# Patient Record
Sex: Female | Born: 1935 | Race: White | Hispanic: No | Marital: Married | Smoking: Former smoker
Health system: Southern US, Community
[De-identification: ages and names within clinical notes are randomized; demographics above are authoritative.]

## PROBLEM LIST (undated history)

## (undated) DIAGNOSIS — I4891 Unspecified atrial fibrillation: Secondary | ICD-10-CM

## (undated) DIAGNOSIS — R5381 Other malaise: Secondary | ICD-10-CM

## (undated) DIAGNOSIS — N19 Unspecified kidney failure: Secondary | ICD-10-CM

## (undated) DIAGNOSIS — E875 Hyperkalemia: Secondary | ICD-10-CM

## (undated) DIAGNOSIS — K56609 Unspecified intestinal obstruction, unspecified as to partial versus complete obstruction: Secondary | ICD-10-CM

## (undated) DIAGNOSIS — M1710 Unilateral primary osteoarthritis, unspecified knee: Secondary | ICD-10-CM

## (undated) DIAGNOSIS — R809 Proteinuria, unspecified: Secondary | ICD-10-CM

## (undated) DIAGNOSIS — C4490 Unspecified malignant neoplasm of skin, unspecified: Secondary | ICD-10-CM

## (undated) DIAGNOSIS — H2510 Age-related nuclear cataract, unspecified eye: Secondary | ICD-10-CM

## (undated) DIAGNOSIS — L600 Ingrowing nail: Secondary | ICD-10-CM

## (undated) DIAGNOSIS — E119 Type 2 diabetes mellitus without complications: Secondary | ICD-10-CM

## (undated) DIAGNOSIS — I499 Cardiac arrhythmia, unspecified: Secondary | ICD-10-CM

## (undated) DIAGNOSIS — B354 Tinea corporis: Secondary | ICD-10-CM

## (undated) DIAGNOSIS — I10 Essential (primary) hypertension: Secondary | ICD-10-CM

## (undated) DIAGNOSIS — N189 Chronic kidney disease, unspecified: Secondary | ICD-10-CM

## (undated) DIAGNOSIS — R5383 Other fatigue: Secondary | ICD-10-CM

## (undated) DIAGNOSIS — G5600 Carpal tunnel syndrome, unspecified upper limb: Secondary | ICD-10-CM

## (undated) DIAGNOSIS — G5790 Unspecified mononeuropathy of unspecified lower limb: Secondary | ICD-10-CM

## (undated) DIAGNOSIS — M62838 Other muscle spasm: Secondary | ICD-10-CM

## (undated) DIAGNOSIS — H353190 Nonexudative age-related macular degeneration, unspecified eye, stage unspecified: Secondary | ICD-10-CM

## (undated) DIAGNOSIS — E7800 Pure hypercholesterolemia, unspecified: Secondary | ICD-10-CM

## (undated) HISTORY — DX: Unspecified intestinal obstruction, unspecified as to partial versus complete obstruction: K56.609

## (undated) HISTORY — PX: CARPAL TUNNEL RELEASE: SHX101

## (undated) HISTORY — DX: Ingrowing nail: L60.0

## (undated) HISTORY — PX: SPINAL FUSION: SHX223

## (undated) HISTORY — DX: Unspecified malignant neoplasm of skin, unspecified: C44.90

## (undated) HISTORY — DX: Unspecified mononeuropathy of unspecified lower limb: G57.90

## (undated) HISTORY — DX: Unspecified kidney failure: N19

## (undated) HISTORY — DX: Hyperkalemia: E87.5

## (undated) HISTORY — PX: OOPHORECTOMY: SHX86

## (undated) HISTORY — DX: Other malaise: R53.81

## (undated) HISTORY — PX: TONSILLECTOMY: SUR1361

## (undated) HISTORY — PX: ABDOMINAL HYSTERECTOMY: SHX81

## (undated) HISTORY — DX: Proteinuria, unspecified: R80.9

## (undated) HISTORY — DX: Carpal tunnel syndrome, unspecified upper limb: G56.00

## (undated) HISTORY — DX: Unspecified atrial fibrillation: I48.91

## (undated) HISTORY — DX: Other muscle spasm: M62.838

## (undated) HISTORY — DX: Chronic kidney disease, unspecified: N18.9

## (undated) HISTORY — DX: Essential (primary) hypertension: I10

## (undated) HISTORY — DX: Other fatigue: R53.83

## (undated) HISTORY — DX: Type 2 diabetes mellitus without complications: E11.9

## (undated) HISTORY — DX: Pure hypercholesterolemia, unspecified: E78.00

## (undated) HISTORY — DX: Nonexudative age-related macular degeneration, unspecified eye, stage unspecified: H35.3190

## (undated) HISTORY — PX: APPENDECTOMY: SHX54

## (undated) HISTORY — DX: Unilateral primary osteoarthritis, unspecified knee: M17.10

## (undated) HISTORY — DX: Tinea corporis: B35.4

## (undated) HISTORY — DX: Age-related nuclear cataract, unspecified eye: H25.10

---

## 1998-11-24 ENCOUNTER — Emergency Department (HOSPITAL_COMMUNITY): Admission: EM | Admit: 1998-11-24 | Discharge: 1998-11-24 | Payer: Self-pay | Admitting: Emergency Medicine

## 2006-04-29 ENCOUNTER — Ambulatory Visit: Payer: Self-pay | Admitting: Family Medicine

## 2006-05-11 ENCOUNTER — Ambulatory Visit: Payer: Self-pay | Admitting: Family Medicine

## 2006-05-20 ENCOUNTER — Ambulatory Visit: Payer: Self-pay | Admitting: Family Medicine

## 2006-05-20 LAB — CONVERTED CEMR LAB
CO2: 28 meq/L (ref 19–32)
Calcium: 9.6 mg/dL (ref 8.4–10.5)
Chloride: 103 meq/L (ref 96–112)
Glucose, Bld: 156 mg/dL — ABNORMAL HIGH (ref 70–99)
Potassium: 3.9 meq/L (ref 3.5–5.3)
Sodium: 142 meq/L (ref 135–145)

## 2006-06-15 ENCOUNTER — Ambulatory Visit: Payer: Self-pay | Admitting: Cardiology

## 2006-06-17 ENCOUNTER — Ambulatory Visit: Payer: Self-pay | Admitting: Family Medicine

## 2006-06-18 ENCOUNTER — Encounter: Payer: Self-pay | Admitting: Family Medicine

## 2006-06-18 LAB — CONVERTED CEMR LAB
ALT: 15 units/L (ref 0–35)
Albumin: 4.1 g/dL (ref 3.5–5.2)
Alkaline Phosphatase: 50 units/L (ref 39–117)
Bilirubin, Direct: 0.1 mg/dL (ref 0.0–0.3)
Total Protein: 6.8 g/dL (ref 6.0–8.3)

## 2006-06-24 DIAGNOSIS — M1710 Unilateral primary osteoarthritis, unspecified knee: Secondary | ICD-10-CM

## 2006-06-24 DIAGNOSIS — E7800 Pure hypercholesterolemia, unspecified: Secondary | ICD-10-CM

## 2006-06-24 DIAGNOSIS — M545 Low back pain, unspecified: Secondary | ICD-10-CM | POA: Insufficient documentation

## 2006-06-24 DIAGNOSIS — E119 Type 2 diabetes mellitus without complications: Secondary | ICD-10-CM | POA: Insufficient documentation

## 2006-06-24 DIAGNOSIS — IMO0002 Reserved for concepts with insufficient information to code with codable children: Secondary | ICD-10-CM | POA: Insufficient documentation

## 2006-06-24 DIAGNOSIS — I10 Essential (primary) hypertension: Secondary | ICD-10-CM

## 2006-06-24 DIAGNOSIS — G8929 Other chronic pain: Secondary | ICD-10-CM

## 2006-06-24 HISTORY — DX: Type 2 diabetes mellitus without complications: E11.9

## 2006-06-24 HISTORY — DX: Reserved for concepts with insufficient information to code with codable children: IMO0002

## 2006-06-24 HISTORY — DX: Essential (primary) hypertension: I10

## 2006-06-24 HISTORY — DX: Pure hypercholesterolemia, unspecified: E78.00

## 2006-07-12 ENCOUNTER — Ambulatory Visit: Payer: Self-pay | Admitting: Cardiology

## 2006-08-12 ENCOUNTER — Ambulatory Visit: Payer: Self-pay | Admitting: Cardiology

## 2006-10-11 ENCOUNTER — Encounter: Payer: Self-pay | Admitting: Internal Medicine

## 2006-11-08 ENCOUNTER — Ambulatory Visit: Payer: Self-pay | Admitting: Internal Medicine

## 2006-11-08 DIAGNOSIS — C4490 Unspecified malignant neoplasm of skin, unspecified: Secondary | ICD-10-CM

## 2006-11-08 DIAGNOSIS — G5790 Unspecified mononeuropathy of unspecified lower limb: Secondary | ICD-10-CM

## 2006-11-08 DIAGNOSIS — G5600 Carpal tunnel syndrome, unspecified upper limb: Secondary | ICD-10-CM

## 2006-11-08 HISTORY — DX: Unspecified mononeuropathy of unspecified lower limb: G57.90

## 2006-11-08 HISTORY — DX: Unspecified malignant neoplasm of skin, unspecified: C44.90

## 2006-11-08 HISTORY — DX: Carpal tunnel syndrome, unspecified upper limb: G56.00

## 2006-11-13 LAB — CONVERTED CEMR LAB
BUN: 20 mg/dL (ref 6–23)
CO2: 30 meq/L (ref 19–32)
Calcium: 9.6 mg/dL (ref 8.4–10.5)
Creatinine, Ser: 0.8 mg/dL (ref 0.4–1.2)
Creatinine,U: 39.5 mg/dL
Hgb A1c MFr Bld: 7.1 % — ABNORMAL HIGH (ref 4.6–6.0)
Microalb Creat Ratio: 3969.6 mg/g — ABNORMAL HIGH (ref 0.0–30.0)
Microalb, Ur: 156.8 mg/dL — ABNORMAL HIGH (ref 0.0–1.9)
Potassium: 3.7 meq/L (ref 3.5–5.1)

## 2006-12-09 ENCOUNTER — Ambulatory Visit: Payer: Self-pay | Admitting: Internal Medicine

## 2006-12-09 ENCOUNTER — Encounter: Payer: Self-pay | Admitting: Internal Medicine

## 2006-12-09 ENCOUNTER — Ambulatory Visit: Payer: Self-pay | Admitting: Cardiology

## 2006-12-09 ENCOUNTER — Inpatient Hospital Stay (HOSPITAL_COMMUNITY): Admission: EM | Admit: 2006-12-09 | Discharge: 2006-12-14 | Payer: Self-pay | Admitting: Emergency Medicine

## 2006-12-09 ENCOUNTER — Telehealth (INDEPENDENT_AMBULATORY_CARE_PROVIDER_SITE_OTHER): Payer: Self-pay | Admitting: *Deleted

## 2006-12-15 ENCOUNTER — Ambulatory Visit: Payer: Self-pay | Admitting: Cardiology

## 2006-12-15 ENCOUNTER — Telehealth: Payer: Self-pay | Admitting: Internal Medicine

## 2006-12-15 LAB — CONVERTED CEMR LAB
BUN: 17 mg/dL (ref 6–23)
Calcium: 10.2 mg/dL (ref 8.4–10.5)
Creatinine, Ser: 1 mg/dL (ref 0.4–1.2)
GFR calc non Af Amer: 58 mL/min
Potassium: 3.9 meq/L (ref 3.5–5.1)
Sodium: 141 meq/L (ref 135–145)

## 2006-12-21 ENCOUNTER — Ambulatory Visit: Payer: Self-pay | Admitting: Internal Medicine

## 2006-12-24 ENCOUNTER — Telehealth: Payer: Self-pay | Admitting: Internal Medicine

## 2006-12-30 ENCOUNTER — Ambulatory Visit: Payer: Self-pay | Admitting: Cardiology

## 2007-01-05 ENCOUNTER — Encounter: Payer: Self-pay | Admitting: Internal Medicine

## 2007-01-05 ENCOUNTER — Ambulatory Visit: Payer: Self-pay | Admitting: Vascular Surgery

## 2007-01-17 ENCOUNTER — Ambulatory Visit: Payer: Self-pay | Admitting: Cardiology

## 2007-01-18 ENCOUNTER — Ambulatory Visit: Payer: Self-pay | Admitting: Internal Medicine

## 2007-02-09 ENCOUNTER — Ambulatory Visit: Payer: Self-pay | Admitting: Internal Medicine

## 2007-03-01 ENCOUNTER — Ambulatory Visit: Payer: Self-pay | Admitting: Internal Medicine

## 2007-03-01 DIAGNOSIS — K5900 Constipation, unspecified: Secondary | ICD-10-CM | POA: Insufficient documentation

## 2007-04-25 ENCOUNTER — Ambulatory Visit: Payer: Self-pay | Admitting: Cardiology

## 2007-06-01 ENCOUNTER — Ambulatory Visit: Payer: Self-pay | Admitting: Internal Medicine

## 2007-06-02 ENCOUNTER — Telehealth: Payer: Self-pay | Admitting: Internal Medicine

## 2007-06-02 LAB — CONVERTED CEMR LAB
ALT: 24 units/L (ref 0–35)
AST: 19 units/L (ref 0–37)
Cholesterol: 155 mg/dL (ref 0–200)
Creatinine, Ser: 1.5 mg/dL — ABNORMAL HIGH (ref 0.4–1.2)
Direct LDL: 68.7 mg/dL
Eosinophils Absolute: 0.2 10*3/uL (ref 0.0–0.6)
GFR calc Af Amer: 44 mL/min
Glucose, Bld: 145 mg/dL — ABNORMAL HIGH (ref 70–99)
HDL: 51.7 mg/dL (ref >39.0)
Hemoglobin: 12.5 g/dL (ref 12.0–15.0)
Lymphocytes Relative: 26.7 % (ref 12.0–46.0)
MCHC: 33.8 g/dL (ref 30.0–36.0)
Platelets: 296 10*3/uL (ref 150–400)
TSH: 2.74 microintl units/mL (ref 0.35–5.50)
Total CHOL/HDL Ratio: 3
Triglycerides: 253 mg/dL (ref 0–149)
WBC: 8.9 10*3/uL (ref 4.5–10.5)

## 2007-07-20 ENCOUNTER — Ambulatory Visit: Payer: Self-pay | Admitting: Cardiology

## 2007-09-28 ENCOUNTER — Ambulatory Visit: Payer: Self-pay | Admitting: Internal Medicine

## 2007-09-29 ENCOUNTER — Telehealth: Payer: Self-pay | Admitting: Internal Medicine

## 2007-09-29 LAB — CONVERTED CEMR LAB
BUN: 58 mg/dL — ABNORMAL HIGH (ref 6–23)
CO2: 23 meq/L (ref 19–32)
GFR calc Af Amer: 38 mL/min
GFR calc non Af Amer: 31 mL/min
Hgb A1c MFr Bld: 6 % (ref 4.6–6.0)
Potassium: 4.5 meq/L (ref 3.5–5.1)
Sodium: 135 meq/L (ref 135–145)

## 2007-10-05 ENCOUNTER — Telehealth (INDEPENDENT_AMBULATORY_CARE_PROVIDER_SITE_OTHER): Payer: Self-pay | Admitting: *Deleted

## 2007-11-01 ENCOUNTER — Ambulatory Visit: Payer: Self-pay | Admitting: Cardiology

## 2007-11-22 ENCOUNTER — Ambulatory Visit: Payer: Self-pay | Admitting: *Deleted

## 2007-11-22 DIAGNOSIS — R5383 Other fatigue: Secondary | ICD-10-CM

## 2007-11-22 DIAGNOSIS — N19 Unspecified kidney failure: Secondary | ICD-10-CM

## 2007-11-22 DIAGNOSIS — R5381 Other malaise: Secondary | ICD-10-CM

## 2007-11-22 HISTORY — DX: Unspecified kidney failure: N19

## 2007-11-22 HISTORY — DX: Other malaise: R53.81

## 2007-11-23 LAB — CONVERTED CEMR LAB
BUN: 84 mg/dL — ABNORMAL HIGH (ref 6–23)
Basophils Absolute: 0 10*3/uL (ref 0.0–0.1)
Basophils Relative: 0.2 % (ref 0.0–3.0)
CO2: 22 meq/L (ref 19–32)
Creatinine, Ser: 2 mg/dL — ABNORMAL HIGH (ref 0.4–1.2)
GFR calc Af Amer: 31 mL/min
GFR calc non Af Amer: 26 mL/min
Lymphocytes Relative: 7.4 % — ABNORMAL LOW (ref 12.0–46.0)
MCHC: 34.4 g/dL (ref 30.0–36.0)
Monocytes Absolute: 0.2 10*3/uL (ref 0.1–1.0)
Monocytes Relative: 1.8 % — ABNORMAL LOW (ref 3.0–12.0)
Neutrophils Relative %: 88.9 % — ABNORMAL HIGH (ref 43.0–77.0)
Platelets: 268 10*3/uL (ref 150–400)
Potassium: 5.8 meq/L — ABNORMAL HIGH (ref 3.5–5.1)
RBC: 3.49 M/uL — ABNORMAL LOW (ref 3.87–5.11)
RDW: 12.1 % (ref 11.5–14.6)
Sodium: 135 meq/L (ref 135–145)
TSH: 2.55 microintl units/mL (ref 0.35–5.50)
WBC: 9.2 10*3/uL (ref 4.5–10.5)

## 2007-11-25 ENCOUNTER — Telehealth (INDEPENDENT_AMBULATORY_CARE_PROVIDER_SITE_OTHER): Payer: Self-pay | Admitting: *Deleted

## 2007-11-30 ENCOUNTER — Ambulatory Visit: Payer: Self-pay | Admitting: *Deleted

## 2007-11-30 DIAGNOSIS — B354 Tinea corporis: Secondary | ICD-10-CM

## 2007-11-30 DIAGNOSIS — E875 Hyperkalemia: Secondary | ICD-10-CM

## 2007-11-30 HISTORY — DX: Tinea corporis: B35.4

## 2007-12-01 LAB — CONVERTED CEMR LAB
Calcium: 10.3 mg/dL (ref 8.4–10.5)
Chloride: 103 meq/L (ref 96–112)
GFR calc non Af Amer: 33 mL/min
Potassium: 5.1 meq/L (ref 3.5–5.1)

## 2007-12-16 ENCOUNTER — Encounter (INDEPENDENT_AMBULATORY_CARE_PROVIDER_SITE_OTHER): Payer: Self-pay | Admitting: *Deleted

## 2007-12-19 ENCOUNTER — Telehealth (INDEPENDENT_AMBULATORY_CARE_PROVIDER_SITE_OTHER): Payer: Self-pay | Admitting: *Deleted

## 2008-01-16 ENCOUNTER — Ambulatory Visit: Payer: Self-pay | Admitting: *Deleted

## 2008-02-01 ENCOUNTER — Ambulatory Visit: Payer: Self-pay | Admitting: Cardiology

## 2008-02-13 ENCOUNTER — Ambulatory Visit: Payer: Self-pay | Admitting: *Deleted

## 2008-02-14 LAB — CONVERTED CEMR LAB
AST: 21 units/L (ref 0–37)
BUN: 32 mg/dL — ABNORMAL HIGH (ref 6–23)
Chloride: 99 meq/L (ref 96–112)
Creatinine, Ser: 1.3 mg/dL — ABNORMAL HIGH (ref 0.4–1.2)
Creatinine,U: 17.9 mg/dL
Microalb Creat Ratio: 234.6 mg/g — ABNORMAL HIGH (ref 0.0–30.0)
Sodium: 139 meq/L (ref 135–145)
Total Bilirubin: 0.7 mg/dL (ref 0.3–1.2)
Total Protein: 7.9 g/dL (ref 6.0–8.3)

## 2008-04-10 ENCOUNTER — Ambulatory Visit: Payer: Self-pay | Admitting: *Deleted

## 2008-04-10 DIAGNOSIS — N184 Chronic kidney disease, stage 4 (severe): Secondary | ICD-10-CM

## 2008-04-10 DIAGNOSIS — L600 Ingrowing nail: Secondary | ICD-10-CM

## 2008-04-10 DIAGNOSIS — N189 Chronic kidney disease, unspecified: Secondary | ICD-10-CM

## 2008-04-10 HISTORY — DX: Ingrowing nail: L60.0

## 2008-04-10 HISTORY — DX: Chronic kidney disease, unspecified: N18.9

## 2008-05-16 ENCOUNTER — Ambulatory Visit: Payer: Self-pay | Admitting: *Deleted

## 2008-05-17 DIAGNOSIS — R809 Proteinuria, unspecified: Secondary | ICD-10-CM | POA: Insufficient documentation

## 2008-05-17 HISTORY — DX: Proteinuria, unspecified: R80.9

## 2008-05-17 LAB — CONVERTED CEMR LAB
ALT: 18 units/L (ref 0–35)
AST: 17 units/L (ref 0–37)
BUN: 29 mg/dL — ABNORMAL HIGH (ref 6–23)
CO2: 29 meq/L (ref 19–32)
Calcium: 10.7 mg/dL — ABNORMAL HIGH (ref 8.4–10.5)
Cholesterol: 176 mg/dL (ref 0–200)
Creatinine, Ser: 1.2 mg/dL (ref 0.4–1.2)
Creatinine,U: 56.5 mg/dL
GFR calc Af Amer: 56 mL/min
Glucose, Bld: 111 mg/dL — ABNORMAL HIGH (ref 70–99)
HDL: 55.8 mg/dL (ref >39.0)
Microalb Creat Ratio: 318.6 mg/g — ABNORMAL HIGH (ref 0.0–30.0)
Microalb, Ur: 18 mg/dL — ABNORMAL HIGH (ref 0.0–1.9)
Potassium: 4 meq/L (ref 3.5–5.1)
Total Protein: 7.9 g/dL (ref 6.0–8.3)
VLDL: 38 mg/dL (ref 0–40)

## 2008-06-07 ENCOUNTER — Telehealth (INDEPENDENT_AMBULATORY_CARE_PROVIDER_SITE_OTHER): Payer: Self-pay | Admitting: *Deleted

## 2008-06-14 ENCOUNTER — Telehealth (INDEPENDENT_AMBULATORY_CARE_PROVIDER_SITE_OTHER): Payer: Self-pay | Admitting: *Deleted

## 2008-07-31 ENCOUNTER — Telehealth (INDEPENDENT_AMBULATORY_CARE_PROVIDER_SITE_OTHER): Payer: Self-pay | Admitting: *Deleted

## 2008-08-08 ENCOUNTER — Ambulatory Visit: Payer: Self-pay | Admitting: Family Medicine

## 2008-08-08 ENCOUNTER — Encounter: Payer: Self-pay | Admitting: Internal Medicine

## 2008-09-06 ENCOUNTER — Ambulatory Visit: Payer: Self-pay | Admitting: Family Medicine

## 2008-09-06 DIAGNOSIS — J309 Allergic rhinitis, unspecified: Secondary | ICD-10-CM | POA: Insufficient documentation

## 2008-09-10 LAB — CONVERTED CEMR LAB: Hgb A1c MFr Bld: 6.8 % — ABNORMAL HIGH (ref 4.6–6.5)

## 2008-09-29 DIAGNOSIS — I4891 Unspecified atrial fibrillation: Secondary | ICD-10-CM

## 2008-09-29 HISTORY — DX: Unspecified atrial fibrillation: I48.91

## 2008-10-05 ENCOUNTER — Ambulatory Visit: Payer: Self-pay | Admitting: Cardiology

## 2008-10-12 ENCOUNTER — Telehealth (INDEPENDENT_AMBULATORY_CARE_PROVIDER_SITE_OTHER): Payer: Self-pay | Admitting: *Deleted

## 2008-10-15 ENCOUNTER — Ambulatory Visit: Payer: Self-pay | Admitting: Diagnostic Radiology

## 2008-10-15 ENCOUNTER — Ambulatory Visit: Payer: Self-pay | Admitting: Internal Medicine

## 2008-10-15 ENCOUNTER — Encounter: Payer: Self-pay | Admitting: Emergency Medicine

## 2008-10-15 ENCOUNTER — Inpatient Hospital Stay (HOSPITAL_COMMUNITY): Admission: EM | Admit: 2008-10-15 | Discharge: 2008-10-18 | Payer: Self-pay | Admitting: Podiatry

## 2008-11-05 ENCOUNTER — Ambulatory Visit: Payer: Self-pay | Admitting: Family Medicine

## 2008-11-05 DIAGNOSIS — K56609 Unspecified intestinal obstruction, unspecified as to partial versus complete obstruction: Secondary | ICD-10-CM

## 2008-11-05 HISTORY — DX: Unspecified intestinal obstruction, unspecified as to partial versus complete obstruction: K56.609

## 2008-11-08 ENCOUNTER — Telehealth (INDEPENDENT_AMBULATORY_CARE_PROVIDER_SITE_OTHER): Payer: Self-pay | Admitting: *Deleted

## 2008-12-10 ENCOUNTER — Telehealth (INDEPENDENT_AMBULATORY_CARE_PROVIDER_SITE_OTHER): Payer: Self-pay | Admitting: *Deleted

## 2008-12-12 ENCOUNTER — Ambulatory Visit: Payer: Self-pay | Admitting: Family Medicine

## 2008-12-12 LAB — CONVERTED CEMR LAB
AST: 22 units/L (ref 0–37)
Albumin: 4.4 g/dL (ref 3.5–5.2)
Alkaline Phosphatase: 52 units/L (ref 39–117)
Chloride: 102 meq/L (ref 96–112)
Cholesterol: 190 mg/dL (ref 0–200)
Creatinine, Ser: 1.2 mg/dL (ref 0.4–1.2)
Glucose, Bld: 121 mg/dL — ABNORMAL HIGH (ref 70–99)
Potassium: 3.5 meq/L (ref 3.5–5.1)
Sodium: 140 meq/L (ref 135–145)
Total Bilirubin: 0.8 mg/dL (ref 0.3–1.2)
Total CHOL/HDL Ratio: 4

## 2008-12-13 ENCOUNTER — Encounter (INDEPENDENT_AMBULATORY_CARE_PROVIDER_SITE_OTHER): Payer: Self-pay | Admitting: *Deleted

## 2008-12-13 ENCOUNTER — Telehealth (INDEPENDENT_AMBULATORY_CARE_PROVIDER_SITE_OTHER): Payer: Self-pay | Admitting: *Deleted

## 2008-12-27 ENCOUNTER — Telehealth (INDEPENDENT_AMBULATORY_CARE_PROVIDER_SITE_OTHER): Payer: Self-pay | Admitting: *Deleted

## 2009-01-16 ENCOUNTER — Encounter (INDEPENDENT_AMBULATORY_CARE_PROVIDER_SITE_OTHER): Payer: Self-pay | Admitting: *Deleted

## 2009-01-31 ENCOUNTER — Ambulatory Visit: Payer: Self-pay | Admitting: Family Medicine

## 2009-02-01 LAB — CONVERTED CEMR LAB
AST: 21 units/L (ref 0–37)
Alkaline Phosphatase: 33 units/L — ABNORMAL LOW (ref 39–117)
Total Protein: 8.2 g/dL (ref 6.0–8.3)

## 2009-02-21 ENCOUNTER — Telehealth (INDEPENDENT_AMBULATORY_CARE_PROVIDER_SITE_OTHER): Payer: Self-pay | Admitting: *Deleted

## 2009-03-07 ENCOUNTER — Telehealth (INDEPENDENT_AMBULATORY_CARE_PROVIDER_SITE_OTHER): Payer: Self-pay | Admitting: *Deleted

## 2009-03-15 ENCOUNTER — Ambulatory Visit: Payer: Self-pay | Admitting: Family

## 2009-03-15 LAB — CONVERTED CEMR LAB
Calcium: 10.4 mg/dL (ref 8.4–10.5)
Cholesterol: 201 mg/dL — ABNORMAL HIGH (ref 0–200)
Creatinine, Ser: 1.6 mg/dL — ABNORMAL HIGH (ref 0.4–1.2)
Direct LDL: 111.4 mg/dL
Glucose, Bld: 113 mg/dL — ABNORMAL HIGH (ref 70–99)
Potassium: 4.3 meq/L (ref 3.5–5.1)
Total CHOL/HDL Ratio: 4
Triglycerides: 215 mg/dL — ABNORMAL HIGH (ref 0.0–149.0)
VLDL: 43 mg/dL — ABNORMAL HIGH (ref 0.0–40.0)

## 2009-03-18 ENCOUNTER — Telehealth: Payer: Self-pay | Admitting: Family

## 2009-03-25 ENCOUNTER — Telehealth (INDEPENDENT_AMBULATORY_CARE_PROVIDER_SITE_OTHER): Payer: Self-pay | Admitting: *Deleted

## 2009-04-15 ENCOUNTER — Ambulatory Visit: Payer: Self-pay | Admitting: Family

## 2009-04-15 LAB — CONVERTED CEMR LAB
Cholesterol, target level: 200 mg/dL
LDL Goal: 100 mg/dL

## 2009-04-18 ENCOUNTER — Telehealth (INDEPENDENT_AMBULATORY_CARE_PROVIDER_SITE_OTHER): Payer: Self-pay | Admitting: *Deleted

## 2009-06-17 ENCOUNTER — Telehealth: Payer: Self-pay | Admitting: Family

## 2009-06-17 ENCOUNTER — Ambulatory Visit: Payer: Self-pay | Admitting: Family

## 2009-06-17 LAB — CONVERTED CEMR LAB
ALT: 23 units/L (ref 0–35)
Albumin: 4.4 g/dL (ref 3.5–5.2)
BUN: 38 mg/dL — ABNORMAL HIGH (ref 6–23)
Calcium: 10.4 mg/dL (ref 8.4–10.5)
Chloride: 107 meq/L (ref 96–112)
Creatinine,U: 133.3 mg/dL
GFR calc non Af Amer: 30.98 mL/min (ref >60)
Hgb A1c MFr Bld: 7.2 % — ABNORMAL HIGH (ref 4.6–6.5)
Microalb, Ur: 28.8 mg/dL — ABNORMAL HIGH (ref 0.0–1.9)
Potassium: 4 meq/L (ref 3.5–5.1)
Sodium: 144 meq/L (ref 135–145)
Total Bilirubin: 0.3 mg/dL (ref 0.3–1.2)
Total CHOL/HDL Ratio: 3
Total Protein: 7.9 g/dL (ref 6.0–8.3)
VLDL: 46.8 mg/dL — ABNORMAL HIGH (ref 0.0–40.0)

## 2009-06-19 ENCOUNTER — Telehealth: Payer: Self-pay | Admitting: Family

## 2009-06-21 ENCOUNTER — Telehealth: Payer: Self-pay | Admitting: Family

## 2009-07-02 ENCOUNTER — Encounter: Payer: Self-pay | Admitting: Family Medicine

## 2009-07-02 LAB — HM DIABETES EYE EXAM: HM Diabetic Eye Exam: NORMAL

## 2009-07-03 ENCOUNTER — Encounter (INDEPENDENT_AMBULATORY_CARE_PROVIDER_SITE_OTHER): Payer: Self-pay | Admitting: *Deleted

## 2009-07-04 ENCOUNTER — Encounter: Payer: Self-pay | Admitting: Family Medicine

## 2009-07-05 ENCOUNTER — Telehealth (INDEPENDENT_AMBULATORY_CARE_PROVIDER_SITE_OTHER): Payer: Self-pay | Admitting: *Deleted

## 2009-07-10 ENCOUNTER — Telehealth (INDEPENDENT_AMBULATORY_CARE_PROVIDER_SITE_OTHER): Payer: Self-pay | Admitting: *Deleted

## 2009-08-08 ENCOUNTER — Ambulatory Visit: Payer: Self-pay | Admitting: Family Medicine

## 2009-08-08 DIAGNOSIS — R42 Dizziness and giddiness: Secondary | ICD-10-CM | POA: Insufficient documentation

## 2009-08-14 ENCOUNTER — Ambulatory Visit: Payer: Self-pay | Admitting: Cardiology

## 2009-08-15 ENCOUNTER — Ambulatory Visit: Payer: Self-pay | Admitting: Family Medicine

## 2009-09-11 ENCOUNTER — Ambulatory Visit: Payer: Self-pay | Admitting: Family Medicine

## 2009-09-11 DIAGNOSIS — R609 Edema, unspecified: Secondary | ICD-10-CM

## 2009-09-13 LAB — CONVERTED CEMR LAB
CO2: 24 meq/L (ref 19–32)
Calcium: 10.1 mg/dL (ref 8.4–10.5)
Chloride: 103 meq/L (ref 96–112)
GFR calc non Af Amer: 48.12 mL/min (ref >60)

## 2009-10-02 ENCOUNTER — Ambulatory Visit: Payer: Self-pay | Admitting: Family Medicine

## 2009-10-04 LAB — CONVERTED CEMR LAB: Chloride: 102 meq/L (ref 96–112)

## 2009-10-17 ENCOUNTER — Telehealth: Payer: Self-pay | Admitting: Family Medicine

## 2009-10-23 ENCOUNTER — Telehealth: Payer: Self-pay | Admitting: Family Medicine

## 2009-10-24 ENCOUNTER — Telehealth (INDEPENDENT_AMBULATORY_CARE_PROVIDER_SITE_OTHER): Payer: Self-pay | Admitting: *Deleted

## 2009-10-24 ENCOUNTER — Emergency Department (HOSPITAL_COMMUNITY): Admission: EM | Admit: 2009-10-24 | Discharge: 2009-10-24 | Payer: Self-pay | Admitting: Emergency Medicine

## 2009-10-24 ENCOUNTER — Ambulatory Visit: Payer: Self-pay | Admitting: Family Medicine

## 2009-10-25 ENCOUNTER — Telehealth (INDEPENDENT_AMBULATORY_CARE_PROVIDER_SITE_OTHER): Payer: Self-pay | Admitting: *Deleted

## 2009-10-31 ENCOUNTER — Telehealth (INDEPENDENT_AMBULATORY_CARE_PROVIDER_SITE_OTHER): Payer: Self-pay | Admitting: *Deleted

## 2009-11-11 ENCOUNTER — Ambulatory Visit: Payer: Self-pay | Admitting: Family Medicine

## 2009-11-11 DIAGNOSIS — N390 Urinary tract infection, site not specified: Secondary | ICD-10-CM | POA: Insufficient documentation

## 2009-11-11 LAB — CONVERTED CEMR LAB
Bilirubin Urine: NEGATIVE
Glucose, Urine, Semiquant: NEGATIVE
Nitrite: NEGATIVE
Protein, U semiquant: >=300
Specific Gravity, Urine: >=1.03
Urobilinogen, UA: 0.2

## 2009-11-12 ENCOUNTER — Encounter: Payer: Self-pay | Admitting: Family Medicine

## 2009-11-13 LAB — CONVERTED CEMR LAB
CO2: 30 meq/L (ref 19–32)
Calcium: 9.8 mg/dL (ref 8.4–10.5)
Creatinine, Ser: 1 mg/dL (ref 0.4–1.2)
Glucose, Bld: 168 mg/dL — ABNORMAL HIGH (ref 70–99)
Pro B Natriuretic peptide (BNP): 84.2 pg/mL (ref 0.0–100.0)

## 2009-11-18 ENCOUNTER — Ambulatory Visit: Payer: Self-pay | Admitting: Family Medicine

## 2009-11-27 ENCOUNTER — Telehealth (INDEPENDENT_AMBULATORY_CARE_PROVIDER_SITE_OTHER): Payer: Self-pay | Admitting: *Deleted

## 2009-11-29 ENCOUNTER — Ambulatory Visit: Payer: Self-pay | Admitting: Family Medicine

## 2009-11-29 LAB — CONVERTED CEMR LAB
Bilirubin Urine: NEGATIVE
Blood in Urine, dipstick: NEGATIVE
Urobilinogen, UA: 0.2
pH: 6.5

## 2009-12-05 ENCOUNTER — Telehealth: Payer: Self-pay | Admitting: Family Medicine

## 2009-12-26 ENCOUNTER — Telehealth (INDEPENDENT_AMBULATORY_CARE_PROVIDER_SITE_OTHER): Payer: Self-pay | Admitting: *Deleted

## 2010-01-01 ENCOUNTER — Encounter (INDEPENDENT_AMBULATORY_CARE_PROVIDER_SITE_OTHER): Payer: Self-pay | Admitting: *Deleted

## 2010-01-01 DIAGNOSIS — H353190 Nonexudative age-related macular degeneration, unspecified eye, stage unspecified: Secondary | ICD-10-CM

## 2010-01-01 DIAGNOSIS — H2510 Age-related nuclear cataract, unspecified eye: Secondary | ICD-10-CM

## 2010-01-01 HISTORY — DX: Age-related nuclear cataract, unspecified eye: H25.10

## 2010-01-01 HISTORY — DX: Nonexudative age-related macular degeneration, unspecified eye, stage unspecified: H35.3190

## 2010-01-02 ENCOUNTER — Ambulatory Visit: Payer: Self-pay | Admitting: Family Medicine

## 2010-01-02 DIAGNOSIS — B372 Candidiasis of skin and nail: Secondary | ICD-10-CM | POA: Insufficient documentation

## 2010-01-08 ENCOUNTER — Ambulatory Visit: Payer: Self-pay | Admitting: Family Medicine

## 2010-01-09 ENCOUNTER — Telehealth: Payer: Self-pay | Admitting: Family Medicine

## 2010-01-09 LAB — CONVERTED CEMR LAB
Albumin: 3.7 g/dL (ref 3.5–5.2)
Alkaline Phosphatase: 54 units/L (ref 39–117)
Calcium: 10.2 mg/dL (ref 8.4–10.5)
Cholesterol: 190 mg/dL (ref 0–200)
Creatinine, Ser: 1.1 mg/dL (ref 0.4–1.2)
Direct LDL: 102.6 mg/dL
GFR calc non Af Amer: 49.05 mL/min (ref >60)
Total Bilirubin: 0.5 mg/dL (ref 0.3–1.2)
Total CHOL/HDL Ratio: 5
Triglycerides: 308 mg/dL — ABNORMAL HIGH (ref 0.0–149.0)
VLDL: 61.6 mg/dL — ABNORMAL HIGH (ref 0.0–40.0)

## 2010-01-27 ENCOUNTER — Ambulatory Visit: Payer: Self-pay | Admitting: Endocrinology

## 2010-02-03 ENCOUNTER — Telehealth: Payer: Self-pay | Admitting: Endocrinology

## 2010-02-06 ENCOUNTER — Telehealth (INDEPENDENT_AMBULATORY_CARE_PROVIDER_SITE_OTHER): Payer: Self-pay | Admitting: *Deleted

## 2010-02-06 ENCOUNTER — Telehealth: Payer: Self-pay | Admitting: Endocrinology

## 2010-02-10 ENCOUNTER — Ambulatory Visit: Payer: Self-pay | Admitting: Endocrinology

## 2010-03-03 ENCOUNTER — Ambulatory Visit: Payer: Self-pay | Admitting: Endocrinology

## 2010-03-07 ENCOUNTER — Telehealth (INDEPENDENT_AMBULATORY_CARE_PROVIDER_SITE_OTHER): Payer: Self-pay | Admitting: *Deleted

## 2010-03-24 ENCOUNTER — Telehealth (INDEPENDENT_AMBULATORY_CARE_PROVIDER_SITE_OTHER): Payer: Self-pay | Admitting: *Deleted

## 2010-03-31 ENCOUNTER — Telehealth (INDEPENDENT_AMBULATORY_CARE_PROVIDER_SITE_OTHER): Payer: Self-pay | Admitting: *Deleted

## 2010-04-02 ENCOUNTER — Ambulatory Visit: Payer: Self-pay | Admitting: Family Medicine

## 2010-04-02 DIAGNOSIS — M62838 Other muscle spasm: Secondary | ICD-10-CM | POA: Insufficient documentation

## 2010-04-02 DIAGNOSIS — S1093XA Contusion of unspecified part of neck, initial encounter: Secondary | ICD-10-CM

## 2010-04-02 DIAGNOSIS — S20219A Contusion of unspecified front wall of thorax, initial encounter: Secondary | ICD-10-CM

## 2010-04-02 DIAGNOSIS — S0083XA Contusion of other part of head, initial encounter: Secondary | ICD-10-CM

## 2010-04-02 DIAGNOSIS — S5000XA Contusion of unspecified elbow, initial encounter: Secondary | ICD-10-CM

## 2010-04-02 DIAGNOSIS — S51009A Unspecified open wound of unspecified elbow, initial encounter: Secondary | ICD-10-CM | POA: Insufficient documentation

## 2010-04-02 DIAGNOSIS — S0003XA Contusion of scalp, initial encounter: Secondary | ICD-10-CM | POA: Insufficient documentation

## 2010-04-02 HISTORY — DX: Other muscle spasm: M62.838

## 2010-04-10 ENCOUNTER — Telehealth (INDEPENDENT_AMBULATORY_CARE_PROVIDER_SITE_OTHER): Payer: Self-pay | Admitting: *Deleted

## 2010-04-14 ENCOUNTER — Ambulatory Visit: Payer: Self-pay | Admitting: Endocrinology

## 2010-05-29 ENCOUNTER — Telehealth (INDEPENDENT_AMBULATORY_CARE_PROVIDER_SITE_OTHER): Payer: Self-pay | Admitting: *Deleted

## 2010-05-29 NOTE — Assessment & Plan Note (Signed)
Summary: RECHECK FOR UTI/CDJ   Vital Signs:  Patient profile:   77 year old female Weight:      258 pounds O2 Sat:      97 % on Room air Pulse rate:   56 / minute BP sitting:   130 / 60  (left arm)  Vitals Entered By: Doristine Devoid CMA (November 29, 2009 1:19 PM)  O2 Flow:  Room air CC: nausea and recheck urine    History of Present Illness: 77 yo woman here today w/ nausea.  recent UTI caused pt to become very ill.  pt reports she 'messed my back up really bad' after walking too far.  decreased appetite.  has been taking Zofran w/ good relief.  + chills.  no fevers.  no diarrhea.  no dysuria or abdominal pain.  isn't sure if nausea is repeat UTI (sxs feel similar), heat related, or due to pain from her back.  Allergies (verified): 1)  ! Penicillin 2)  ! Coumadin 3)  ! Beta Blockers 4)  ! Keflex (Cephalexin)  Review of Systems      See HPI  Physical Exam  General:  Well-developed,well-nourished,in no acute distress; alert,appropriate and cooperative throughout examination Lungs:  Normal respiratory effort, chest expands symmetrically. Lungs are clear to auscultation, no crackles or wheezes. Heart:  Normal rate and regular rhythm. S1 and S2 normal  Abdomen:  soft, NT/ND, +BS.  no rebound or guarding.  no CVA tenderness   Impression & Recommendations:  Problem # 1:  UTI (ICD-599.0) Assessment Unchanged pt's urine again suspicious for infxn- likely cause of pt's nausea..  re-start Cipro and send urine for cx.  will adjust meds as needed. Her updated medication list for this problem includes:    Cipro 500 Mg Tabs (Ciprofloxacin hcl) .Marland Kitchen... 1 by mouth 2 times daily  Orders: T-Culture, Urine ([id number]) UA Dipstick w/o Micro (manual) ([id number]) Prescription Created Electronically [id number])  Complete Medication List: 1)  Bumex 1 Mg Tabs (Bumetanide) .Marland Kitchen.. 1 tab by mouth two times a day. 2)  Hydralazine Hcl 50 Mg Tabs (Hydralazine hcl) .... One by mouth 2 times a day 3)   Clonidine Hcl 0.3 Mg Tabs (Clonidine hcl) .Marland Kitchen.. 1 by mouth bid 4)  Diltiazem Hcl Er Beads 300 Mg Xr24h-cap (Diltiazem hcl er beads) .... One tab by mouth once daily 5)  Losartan Potassium-hctz 100-12.5 Mg Tabs (Losartan potassium-hctz) .... One tablet by mouth daily 6)  Humulin N 100 Unit/ml Susp (Insulin isophane human) .... Take as directed 7)  Aspirin Childrens 81 Mg Chew (Aspirin) .... Take 2 tablet by mouth once a day 8)  Tylenol Extra Strength 500 Mg Tabs (Acetaminophen) .... 2 by mouth three times a day 9)  Calcium 500 Mg Tabs (Calcium carbonate) .Marland Kitchen.. 1 tab once daily 10)  Zyrtec Allergy 10 Mg Tabs (Cetirizine hcl) .Marland Kitchen.. 1 tab once daily 11)  One Touch Ultra Test Strips  12)  [facility] Syringe/ndl 31g 0.39ml 8mm  .... As directed 13)  One Touch Ultra Soft Lancets  14)  Osteo Bi-flex Regular Strength 250-200 Mg Tabs (Glucosamine-chondroitin) .... 2  tab once daily 15)  Miralax Powd (Polyethylene glycol 3350) .... Prn 16)  Nystatin-triamcinolone 100000-0.1 Unit/gm-% Crea (Nystatin-triamcinolone) .... Apply sparingly  twice daily to affected areas after sitz bath 17)  Simvastatin 40 Mg Tabs (Simvastatin) .... One tab by mouth once daily 18)  Icaps Areds Formula Tabs (Multiple vitamins-minerals) .Marland Kitchen.. 1 tab two times a day 19)  Bumetanide 1 Mg Tabs (Bumetanide) .Marland KitchenMarland KitchenMarland Kitchen 1  tab once daily 20)  Ondansetron 8 Mg Tbdp (Ondansetron) .Marland Kitchen.. 1 tab by mouth q8 as needed for nausea 21)  Cipro 500 Mg Tabs (Ciprofloxacin hcl) .Marland Kitchen.. 1 by mouth 2 times daily  Patient Instructions: 1)  Use the Zofran (odansetron) as needed for nausea 2)  Make sure you are eating regularly- this will help prevent nausea 3)  Take the Cipro as directed- take w/ food 4)  Drink plenty of fluids 5)  Elevate your legs  6)  We'll call you with your urine culture results 7)  Call with any questions or concerns 8)  Hang in there! Prescriptions: CIPRO 500 MG TABS (CIPROFLOXACIN HCL) 1 by mouth 2 times daily  #10 x 0   Entered and  Authorized by:   Neena Rhymes MD   Signed by:   Neena Rhymes MD on 11/29/2009   Method used:   Electronically to        [facility] #[id number]* (retail)       [address]       [place], [place]  [address]       Ph: [phone]       Fax: [phone]   RxID:   [id number] ONDANSETRON 8 MG TBDP (ONDANSETRON) 1 tab by mouth Q8 as needed for nausea  #30 x 0   Entered and Authorized by:   Neena Rhymes MD   Signed by:   Neena Rhymes MD on 11/29/2009   Method used:   Electronically to        [facility] [phone]* (retail)       [address]       [place], [place]  [address]       Ph: [phone]       Fax: [phone]   RxID:   [id number]   Laboratory Results   Urine Tests    Routine Urinalysis   Glucose: negative   (Normal Range: Negative) Bilirubin: negative   (Normal Range: Negative) Ketone: negative   (Normal Range: Negative) Spec. Gravity: 1.015   (Normal Range: 1.003-1.035) Blood: negative   (Normal Range: Negative) pH: 6.5   (Normal Range: 5.0-8.0) Protein: >=300   (Normal Range: Negative) Urobilinogen: 0.2   (Normal Range: 0-1) Nitrite: negative   (Normal Range: Negative) Leukocyte Esterace: small   (Normal Range: Negative)

## 2010-05-29 NOTE — Assessment & Plan Note (Signed)
Summary: rto 3 months/cbs   Vital Signs:  Patient profile:   77 year old female Weight:      259 pounds Pulse rate:   70 / minute BP sitting:   136 / 64  (left arm)  Vitals Entered By: Doristine Devoid CMA (January 02, 2010 2:02 PM) CC: 3 month roa   History of Present Illness: 77 yo woman here today for  1) HTN- excellent today.  asymptomatic.  2) DM- CBGs 'pretty good'.  typically <120 by pt report.  no symptomatic lows.  no CP, SOB above baseline, edema, N/V, visual changes.  UTD on eye exam- no retinopathy.  3) hyperlipidemia- simvastatin.  no abd pain, myalgias, N/V.  Current Medications (verified): 1)  Bumex 1 Mg  Tabs (Bumetanide) .Marland Kitchen.. 1 Tab By Mouth Two Times A Day. 2)  Hydralazine Hcl 50 Mg Tabs (Hydralazine Hcl) .... Take 1 Tablet By Mouth Three Times A Day. 3)  Clonidine Hcl 0.3 Mg  Tabs (Clonidine Hcl) .Marland Kitchen.. 1 By Mouth Bid 4)  Diltiazem Hcl Er Beads 300 Mg Xr24h-Cap (Diltiazem Hcl Er Beads) .... One Tab By Mouth Once Daily 5)  Losartan Potassium-Hctz 100-12.5 Mg Tabs (Losartan Potassium-Hctz) .... One Tablet By Mouth Daily 6)  Humulin N 100 Unit/ml  Susp (Insulin Isophane Human) .... Take As Directed 7)  Aspirin Childrens 81 Mg Chew (Aspirin) .... Take 2 Tablet By Mouth Once A Day 8)  Tylenol Extra Strength 500 Mg  Tabs (Acetaminophen) .... 2 By Mouth Three Times A Day 9)  Calcium 500 Mg Tabs (Calcium Carbonate) .Marland Kitchen.. 1 Tab Once Daily 10)  Zyrtec Allergy 10 Mg Tabs (Cetirizine Hcl) .Marland Kitchen.. 1 Tab Once Daily 11)  One Touch Ultra Test Strips 12)  [facility] Syringe/ndl 31g 0.64ml 8mm .... As Directed 13)  One Touch Ultra Soft Lancets 14)  Osteo Bi-Flex Regular Strength 250-200 Mg Tabs (Glucosamine-Chondroitin) .... 2  Tab Once Daily 15)  Miralax   Powd (Polyethylene Glycol 3350) .... Prn 16)  Nystatin-Triamcinolone 100000-0.1 Unit/gm-%  Crea (Nystatin-Triamcinolone) .... Apply Sparingly  Twice Daily To Affected Areas 17)  Simvastatin 40 Mg Tabs (Simvastatin) .... One Tab By  Mouth Once Daily 18)  Icaps Areds Formula  Tabs (Multiple Vitamins-Minerals) .Marland Kitchen.. 1 Tab Two Times A Day 19)  Ondansetron 8 Mg Tbdp (Ondansetron) .Marland Kitchen.. 1 Tab By Mouth Q8 As Needed For Nausea  Allergies (verified): 1)  ! Penicillin 2)  ! Coumadin 3)  ! Beta Blockers 4)  ! Keflex (Cephalexin)  Past History:  Past Medical History: Last updated: 11/05/2008 PAROXYSMAL ATRIAL FIBRILLATION (ICD-427.31) HYPERCHOLESTEROLEMIA (ICD-272.0) HYPERTENSION, BENIGN SYSTEMIC (ICD-401.1) FATIGUE (ICD-780.79) HYPERKALEMIA (ICD-276.7) RENAL FAILURE (ICD-586) RENAL INSUFFICIENCY, CHRONIC (ICD-585.9) RHINITIS (ICD-477.9) PROTEINURIA (ICD-791.0) DERMATOPHYTOSIS OF THE BODY (ICD-110.5) ENCOUNTER FOR LONG-TERM USE OF OTHER MEDICATIONS (ICD-V58.69) HEALTH SCREENING (ICD-V70.0) UNSPECIFIED CONSTIPATION (ICD-564.00) DIABETES MELLITUS II, UNCOMPLICATED (ICD-250.00) PERIPHERAL NEUROPATHY, 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  quit 1980 Alcohol Use - no Regular Exercise - no Drug Use - no  Review of Systems      See HPI  Physical Exam  General:  Well-developed,well-nourished,in no acute distress; alert,appropriate and cooperative throughout examination Neck:  No deformities, masses, or tenderness noted. Lungs:  Normal respiratory effort, chest expands symmetrically. Lungs are clear to auscultation, no crackles or wheezes. Heart:  Normal rate and regular rhythm. S1 and S2 normal  Abdomen:  soft, NT/ND, +BS.  no rebound or guarding. Pulses:  +2 carotid, radial, DP Extremities:  +1 left pedal edema and right pedal edema.  Skin:  erythematous areas under breasts and in groin creases  consistent w/ yeast  Diabetes Management Exam:    Eye Exam:       Eye Exam done elsewhere          Date: 12/31/2009          Results: normal          Done by: Hazle Quant   Impression & Recommendations:  Problem # 1:  DIABETES MELLITUS II, UNCOMPLICATED (ICD-250.00) Assessment Unchanged CBGs good based on pt's report.  due for A1C.  will get this when pt returns for fasting labs.  adjust meds as needed. Her updated medication list for this problem includes:    Losartan Potassium-hctz 100-12.5 Mg Tabs (Losartan potassium-hctz) ..... One tablet by mouth daily    Humulin N 100 Unit/ml Susp (Insulin isophane human) .Marland Kitchen... Take as directed    Aspirin Childrens 81 Mg Chew (Aspirin) .Marland Kitchen... Take 2 tablet by mouth once a day  Problem # 2:  HYPERTENSION, BENIGN SYSTEMIC (ICD-401.1) Assessment: Unchanged BP well controlled today.  asymptomatic.  no med changes at this time. The following medications were removed from the medication list:    Bumetanide 1 Mg Tabs (Bumetanide) .Marland Kitchen... 1 tab once daily Her updated medication list for this problem includes:    Bumex 1 Mg Tabs (Bumetanide) .Marland Kitchen... 1 tab by mouth two times a day.    Hydralazine Hcl 50 Mg Tabs (Hydralazine hcl) .Marland Kitchen... Take 1 tablet by mouth three times a day.    Clonidine Hcl 0.3 Mg Tabs (Clonidine hcl) .Marland Kitchen... 1 by mouth bid    Diltiazem Hcl Er Beads 300 Mg Xr24h-cap (Diltiazem hcl er beads) ..... One tab by mouth once daily    Losartan Potassium-hctz 100-12.5 Mg Tabs (Losartan potassium-hctz) ..... One tablet by mouth daily  Problem # 3:  HYPERCHOLESTEROLEMIA (ICD-272.0) Assessment: Unchanged due for fasting labs, will return for these.  will adjust meds as needed. Her updated medication list for this problem includes:    Lipitor 20 Mg Tabs (Atorvastatin calcium) .Marland Kitchen... Take 1 tab once daily at bedtime    Fenofibrate 160 Mg Tabs (Fenofibrate) .Marland Kitchen... Take 1 tab once daily  Problem # 4:  CANDIDIASIS, SKIN (ICD-112.3) Assessment: New  start  nystatin powder for creases.  Orders: Prescription Created Electronically ([id number])  Complete Medication List: 1)  Bumex 1 Mg Tabs (Bumetanide) .Marland Kitchen.. 1 tab by mouth two times a day. 2)  Hydralazine Hcl 50 Mg Tabs (Hydralazine hcl) .... Take 1 tablet by mouth three times a day. 3)  Clonidine Hcl 0.3 Mg Tabs (Clonidine hcl) .Marland Kitchen.. 1 by mouth bid 4)  Diltiazem Hcl Er Beads 300 Mg Xr24h-cap (Diltiazem hcl er beads) .... One tab by mouth once daily 5)  Losartan Potassium-hctz 100-12.5 Mg Tabs (Losartan potassium-hctz) .... One tablet by mouth daily 6)  Humulin N 100 Unit/ml Susp (Insulin isophane human) .... Take as directed 7)  Aspirin Childrens 81 Mg Chew (Aspirin) .... Take 2 tablet by mouth once a day 8)  Tylenol Extra Strength 500 Mg Tabs (Acetaminophen) .... 2 by mouth three times a day 9)  Calcium 500 Mg Tabs (Calcium carbonate) .Marland Kitchen.. 1 tab once daily 10)  Zyrtec Allergy 10 Mg Tabs (Cetirizine hcl) .Marland Kitchen.. 1 tab once daily 11)  One Touch Ultra Test  Strips  12)  [facility] Syringe/ndl 31g 0.10ml 8mm  .... As directed 13)  One Touch Ultra Soft Lancets  14)  Osteo Bi-flex Regular Strength 250-200 Mg Tabs (Glucosamine-chondroitin) .... 2  tab once daily 15)  Miralax Powd (Polyethylene glycol 3350) .... Prn 16)  Nystatin-triamcinolone 100000-0.1 Unit/gm-% Crea (Nystatin-triamcinolone) .... Apply sparingly  twice daily to affected areas 17)  Lipitor 20 Mg Tabs (Atorvastatin calcium) .... Take 1 tab once daily at bedtime 18)  Icaps Areds Formula Tabs (Multiple vitamins-minerals) .Marland Kitchen.. 1 tab two times a day 19)  Ondansetron 8 Mg Tbdp (Ondansetron) .Marland Kitchen.. 1 tab by mouth q8 as needed for nausea 20)  Fenofibrate 160 Mg Tabs (Fenofibrate) .... Take 1 tab once daily  Other Orders: Flu Vaccine 29yrs + MEDICARE PATIENTS ([id number]) Administration Flu vaccine - MCR ([id number])  Patient Instructions: 1)  Schedule a fasting lab visit at your convenience 2)  BMP prior to visit, ICD-9: 250.0 3)  Hepatic Panel prior to  visit ICD-9: 272 4)  Lipid panel prior to visit ICD-9 : 272 5)  HgBA1c prior to visit  ICD-9: 250.0 6)  Follow up with me in 3 months to recheck the diabetes 7)  Call with any questions or concerns 8)  Keep up the good work! 9)  Enjoy your fall season!!! Prescriptions: NYSTATIN-TRIAMCINOLONE 100000-0.1 UNIT/GM-%  CREA (NYSTATIN-TRIAMCINOLONE) apply sparingly  twice daily to affected areas  #1 tube x 6   Entered and Authorized by:   Neena Rhymes MD   Signed by:   Neena Rhymes MD on 01/02/2010   Method used:   Electronically to        [facility] #[id number]* (retail)       [address]       [place], [place]  [address]       Ph: [phone]       Fax: [phone]   RxID:   [id number]  Flu Vaccine Consent Questions     Do you have a history of severe allergic reactions to this vaccine? no    Any prior history of allergic reactions to egg and/or gelatin? no    Do you have a sensitivity to the preservative Thimersol? no    Do you have a past history of Guillan-Barre Syndrome? no    Do you currently have an acute febrile illness? no    Have you ever had a severe reaction to latex? no    Vaccine information given and explained to patient? yes    Are you currently pregnant? no    Lot Number:[id number]   Exp Date:10/25/2010   Site Given  Left Deltoid [id number]      .lbmedflu

## 2010-05-29 NOTE — Progress Notes (Signed)
Summary: hydrazlazine refill   Phone Note Refill Request Message from:  Fax from Pharmacy on April 10, 2010 11:15 AM  Refills Requested: Medication #1:  HYDRALAZINE HCL 50 MG TABS Take 1 tablet by mouth three times a day. [facility] [address] - fax [phone]  Initial call taken by: Okey Regal [place],  April 10, 2010 11:16 AM    Prescriptions: HYDRALAZINE HCL 50 MG TABS (HYDRALAZINE HCL) Take 1 tablet by mouth three times a day.  #90 x 2   Entered by:   Doristine Devoid CMA   Authorized by:   Neena Rhymes MD   Signed by:   Doristine Devoid CMA on 04/10/2010   Method used:   Electronically to        [facility] #[id number]* (retail)       [address]       [place], [place]  [address]       Ph: [phone]       Fax: [phone]   RxID:   [id number]

## 2010-05-29 NOTE — Assessment & Plan Note (Signed)
Summary: ELEVATED BP X3 DAYS-COMING NOW/CDJ   Vital Signs:  Patient profile:   77 year old female Weight:      259 pounds Pulse rate:   56 / minute BP sitting:   140 / 62  (left arm)  Vitals Entered By: Doristine Devoid (August 08, 2009 4:04 PM) CC: says bp was elevated x5 days has been getting 156/111-160/103 at home    History of Present Illness: 77 yo woman here today for BP elevation x5 days.  did not bring her cuff today to assess accuracy.  BP in office is stable w/ last few visits.  reports this AM was 156/111.  c/o mild SOB and slight dizziness.  pt reports SOB is at baseline.  husband reports dizziness worsens w/ exertion or rapid movement of head.  denies veritgo, describes as 'off balance'.  more problems w/ mobility recently.  husband reports dizziness increased when pt changed to Clonidine three times a day.  hypercholesterolemia- pt reports CBGs elevated on Fenofibrate.  stopped fenofibrate due to inability to control sugars and they returned to baseline.  Medications Prior to Update: 1)  Bumex 1 Mg  Tabs (Bumetanide) .... Qd 2)  Hydralazine Hcl 50 Mg  Tabs (Hydralazine Hcl) .... One By Mouth 3 Times A Day 3)  Clonidine Hcl 0.3 Mg  Tabs (Clonidine Hcl) .Marland Kitchen.. 1 By Mouth Tid 4)  Diltiazem Hcl Er Beads 300 Mg Xr24h-Cap (Diltiazem Hcl Er Beads) .... One Tab By Mouth Once Daily 5)  Losartan Potassium-Hctz 100-12.5 Mg Tabs (Losartan Potassium-Hctz) .... One Tablet By Mouth Daily 6)  Humulin N 100 Unit/ml  Susp (Insulin Isophane Human) .... 60 Units Daily in The Evening 7)  Aspirin Childrens 81 Mg Chew (Aspirin) .... Take 2 Tablet By Mouth Once A Day 8)  Tylenol Extra Strength 500 Mg  Tabs (Acetaminophen) .... 2 By Mouth Three Times A Day 9)  Centrum Silver   Tabs (Multiple Vitamins-Minerals) .Marland Kitchen.. 1 Tab Once Daily 10)  Calcium 500 Mg Tabs (Calcium Carbonate) .Marland Kitchen.. 1 Tab Once Daily 11)  Zyrtec Allergy 10 Mg Tabs (Cetirizine Hcl) .Marland Kitchen.. 1 Tab Once Daily 12)  One Touch Ultra Test  Strips 13)  [facility] Syringe/ndl 31g 0.75ml 8mm .... As Directed 14)  One Touch Ultra Soft Lancets 15)  Osteo Bi-Flex Regular Strength 250-200 Mg Tabs (Glucosamine-Chondroitin) .... 2  Tab Once Daily 16)  Miralax   Powd (Polyethylene Glycol 3350) .... Prn 17)  Nystatin-Triamcinolone 100000-0.1 Unit/gm-%  Crea (Nystatin-Triamcinolone) .... Apply Sparingly  Twice Daily To Affected Areas After Sitz Bath 18)  Simvastatin 40 Mg Tabs (Simvastatin) .... One Tab By Mouth Once Daily 19)  Fenofibrate 160 Mg Tabs (Fenofibrate) .Marland Kitchen.. 1 By Mouth Once Daily  Allergies (verified): 1)  ! Penicillin 2)  ! Coumadin 3)  ! 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 No apparent alopecia or balding. Eyes:  PERRL, EOMI Neck:  No deformities, masses, or tenderness noted. Lungs:  Normal respiratory effort, chest expands symmetrically. Lungs are clear to auscultation, no crackles or  wheezes. Heart:  Normal rate and regular rhythm. S1 and S2 normal  Extremities:  nonpitting left pedal edema and trace right pedal edema. 2+ bilateral DP pulses   Impression & Recommendations:  Problem # 1:  HYPERTENSION, BENIGN SYSTEMIC (ICD-401.1) Assessment Unchanged pt's home BP readings elevated but acceptable here in office.  provided pt reassurance.  given increase in dizziness since increasing Clonidine to three times a day will decrease back to two times a day.  reviewed red flags.  pt and husband express understanding. Her updated medication list for this problem includes:    Bumex 1 Mg Tabs (Bumetanide) ..... Qd    Hydralazine Hcl 50 Mg Tabs (Hydralazine hcl) ..... One by mouth 3 times a day    Clonidine Hcl 0.3 Mg Tabs (Clonidine hcl) .Marland Kitchen... 1 by mouth bid    Diltiazem Hcl Er Beads 300 Mg Xr24h-cap (Diltiazem hcl er beads) ..... One tab by mouth once daily    Losartan Potassium-hctz 100-12.5 Mg Tabs (Losartan potassium-hctz) ..... One tablet by mouth daily  Problem # 2:  DIZZINESS (ICD-780.4) Assessment: New pt w/ multiple possible causes- HTN, allergic rhinitis, DM, medications.  unable to do EKG today b/c pt reports she is unable to lie flat due to back pain.  pt and husband link increase in dizziness to change in Clonidine.  reduce dose.  reviewed red flags that should prompt immediate return.  pt and husband agree. Her updated medication list for this problem includes:    Zyrtec Allergy 10 Mg Tabs (Cetirizine hcl) .Marland Kitchen... 1 tab once daily  Problem # 3:  HYPERCHOLESTEROLEMIA (ICD-272.0) Assessment: Unchanged pt stopped fenofibrate b/c she reports that this caused elevated CBGs that she was unable to control.  not due for lipids today- will check at future visit. The following medications were removed from the medication list:    Fenofibrate 160 Mg Tabs (Fenofibrate) .Marland Kitchen... 1 by mouth once daily Her updated medication list for this problem includes:    Simvastatin 40 Mg Tabs  (Simvastatin) ..... One tab by mouth once daily  Complete Medication List: 1)  Bumex 1 Mg Tabs (Bumetanide) .... Qd 2)  Hydralazine Hcl 50 Mg Tabs (Hydralazine hcl) .... One by mouth 3 times a day 3)  Clonidine Hcl 0.3 Mg Tabs (Clonidine hcl) .Marland Kitchen.. 1 by mouth bid 4)  Diltiazem Hcl Er Beads 300 Mg Xr24h-cap (Diltiazem hcl er beads) .... One tab by mouth once daily 5)  Losartan Potassium-hctz 100-12.5 Mg Tabs (Losartan potassium-hctz) .... One tablet by mouth daily 6)  Humulin N 100 Unit/ml Susp (Insulin isophane human) .... 60 units daily in the evening 7)  Aspirin Childrens 81 Mg Chew (Aspirin) .... Take 2 tablet by mouth once a day 8)  Tylenol Extra Strength 500 Mg Tabs (Acetaminophen) .... 2 by mouth three times a day 9)  Centrum Silver Tabs (Multiple vitamins-minerals) .Marland Kitchen.. 1 tab once daily 10)  Calcium 500 Mg Tabs (Calcium carbonate) .Marland Kitchen.. 1 tab once daily 11)  Zyrtec Allergy 10 Mg Tabs (Cetirizine hcl) .Marland Kitchen.. 1 tab once daily 12)  One Touch Ultra Test Strips  13)  [facility] Syringe/ndl 31g 0.11ml 8mm  .... As directed 14)  One Touch Ultra Soft Lancets  15)  Osteo Bi-flex Regular Strength 250-200 Mg Tabs (Glucosamine-chondroitin) .... 2  tab once daily 16)  Miralax Powd (Polyethylene glycol 3350) .... Prn 17)  Nystatin-triamcinolone 100000-0.1 Unit/gm-% Crea (Nystatin-triamcinolone) .... Apply sparingly  twice daily to affected areas after sitz bath 18)  Simvastatin 40 Mg Tabs (Simvastatin) .... One tab by mouth once daily  Patient Instructions: 1)  please schedule a nurse visit for next week- bring your cuff 2)  decrease your clonidine to two times a day 3)  change positions slowly to allow your body time to adjust 4)  please call Dr Daleen Squibb to schedule an appt and let him know what's going on 5)  if you have worsening shortness of breath, dizziness, or other concerns- please call or go to the ER 6)  Hang in there!!!

## 2010-05-29 NOTE — Progress Notes (Signed)
Summary: Elevated CBG  Phone Note Call from Patient Call back at [facility] Phone [phone]   Caller: Patient Summary of Call: Pt called stating her CBGs are consistently 200+ at night. Pt is requesting med adjustment and has been advised that [facility] is out of office until 02/05/2010. Pt decided to wait for ENDO and not to contact PCP as I advised. Pt says she was "referred to [facility] because PCP can not help with Diabetes!" Initial call taken by: Margaret Pyle, CMA,  February 03, 2010 2:32 PM  Follow-up for Phone Call        increase supper regular to 15 units.  decrease nph to 40 units at night.  ret next week as scheduled. Follow-up by: Minus Breeding MD,  February 05, 2010 12:41 PM  Additional Follow-up for Phone Call Additional follow up Details #1::        Pt informed and will keep ROV Additional Follow-up by: Margaret Pyle, CMA,  February 05, 2010 12:53 PM    New/Updated Medications: HUMULIN N 100 UNIT/ML  SUSP (INSULIN ISOPHANE HUMAN) 40 units at bedtime HUMULIN R 100 UNIT/ML SOLN (INSULIN REGULAR HUMAN) three times a day (just before each meal) 08-29-13 units

## 2010-05-29 NOTE — Assessment & Plan Note (Signed)
Summary: URINARY FREQUENCY AND CHILLS/CDJ   Vital Signs:  Patient profile:   77 year old female Weight:      258 pounds O2 Sat:      97 % on Room air Temp:     99.0 degrees F oral Pulse rate:   95 / minute BP sitting:   180 / 80  (left arm)  Vitals Entered By: Doristine Devoid (October 24, 2009 1:38 PM)  O2 Flow:  Room air CC: nausea and urinary frequency along w/ chills  Comments patient left and is going to go to emergency room for evaluation ........Marland KitchenDoristine Devoid  October 24, 2009 2:16 PM    History of Present Illness: Pt left prior to being seen- son took her to ER  Allergies: 1)  ! Penicillin 2)  ! Coumadin 3)  ! Beta Blockers   Complete Medication List: 1)  Bumex 1 Mg Tabs (Bumetanide) .Marland Kitchen.. 1 tab by mouth two times a day. 2)  Hydralazine Hcl 50 Mg Tabs (Hydralazine hcl) .... One by mouth 2 times a day 3)  Clonidine Hcl 0.3 Mg Tabs (Clonidine hcl) .Marland Kitchen.. 1 by mouth bid 4)  Diltiazem Hcl Er Beads 300 Mg Xr24h-cap (Diltiazem hcl er beads) .... One tab by mouth once daily 5)  Losartan Potassium-hctz 100-12.5 Mg Tabs (Losartan potassium-hctz) .... One tablet by mouth daily 6)  Humulin N 100 Unit/ml Susp (Insulin isophane human) .... 60 units daily in the evening 7)  Aspirin Childrens 81 Mg Chew (Aspirin) .... Take 2 tablet by mouth once a day 8)  Tylenol Extra Strength 500 Mg Tabs (Acetaminophen) .... 2 by mouth three times a day 9)  Calcium 500 Mg Tabs (Calcium carbonate) .Marland Kitchen.. 1 tab once daily 10)  Zyrtec Allergy 10 Mg Tabs (Cetirizine hcl) .Marland Kitchen.. 1 tab once daily 11)  One Touch Ultra Test Strips  12)  [facility] Syringe/ndl 31g 0.54ml 8mm  .... As directed 13)  One Touch Ultra Soft Lancets  14)  Osteo Bi-flex Regular Strength 250-200 Mg Tabs (Glucosamine-chondroitin) .... 2  tab once daily 15)  Miralax Powd (Polyethylene glycol 3350) .... Prn 16)  Nystatin-triamcinolone 100000-0.1 Unit/gm-% Crea (Nystatin-triamcinolone) .... Apply sparingly  twice daily to affected areas after sitz  bath 17)  Simvastatin 40 Mg Tabs (Simvastatin) .... One tab by mouth once daily 18)  Icaps Areds Formula Tabs (Multiple vitamins-minerals) .Marland Kitchen.. 1 tab two times a day 19)  Bumetanide 1 Mg Tabs (Bumetanide) .Marland Kitchen.. 1 tab once daily  Other Orders: No Charge Patient Arrived (NCPA0) (NCPA0)

## 2010-05-29 NOTE — Progress Notes (Signed)
Summary: Losartan-HCTZ   Phone Note Other Incoming   Summary of Call: Pt's husband came to office with Avalide denial letter. Concerned that nothing had been done.  I notifed husband of conversation I had with pt. in February when she told me she didn't want to start new med at that time.  Spoke to pt. today and she states she did not understand that the insurance would not cover it.  Losartan-HCTZ called to Lauren @ [facility] this a.m. and informed pharmacist that pt's husband was on his way to pick up script.  Advised pt.'s husband that Melissa had wanted pt. to return 2 weeks after starting new med. but pt. wanted to wait until Dr. Rennis Golden return. Advised husband to bring pt. in earlier if she experiences any problems. Pt. husband voices understanding. Initial call taken by: Mervin Kung CMA,  July 10, 2009 11:56 AM

## 2010-05-29 NOTE — Assessment & Plan Note (Signed)
Summary: PER PT 1 MTH FU--D/T---STC   Vital Signs:  Patient profile:   77 year old female Height:      66 inches (167.64 cm) Weight:      257 pounds (116.82 kg) BMI:     41.63 O2 Sat:      95 % on Room air Temp:     98.3 degrees F (36.83 degrees C) oral Pulse rate:   69 / minute BP sitting:   150 / 62  (left arm) Cuff size:   large  Vitals Entered By: Brenton Grills CMA Duncan Dull) (April 14, 2010 4:17 PM)  O2 Flow:  Room air CC: 1 month F/U/aj Is Patient Diabetic? Yes   Primary Provider:  Neena Rhymes MD  CC:  1 month F/U/aj.  History of Present Illness: pt says she sometimes awakens in the middle of the night (approx 2/month), with palpitations and excessive diaphoresis.  she brings a record of her cbg's which i have reviewed today.  almost all are in the mid-100's.  Current Medications (verified): 1)  Bumex 1 Mg  Tabs (Bumetanide) .Marland Kitchen.. 1 Tab By Mouth Two Times A Day. 2)  Hydralazine Hcl 50 Mg Tabs (Hydralazine Hcl) .... Take 1 Tablet By Mouth Three Times A Day. 3)  Clonidine Hcl 0.3 Mg  Tabs (Clonidine Hcl) .Marland Kitchen.. 1 By Mouth Bid 4)  Diltiazem Hcl Er Beads 300 Mg Xr24h-Cap (Diltiazem Hcl Er Beads) .... One Tab By Mouth Once Daily 5)  Losartan Potassium-Hctz 100-12.5 Mg Tabs (Losartan Potassium-Hctz) .... One Tablet By Mouth Daily 6)  Humulin N 100 Unit/ml  Susp (Insulin Isophane Human) .... 30 Units At Bedtime 7)  Aspirin Childrens 81 Mg Chew (Aspirin) .... Take 2 Tablet By Mouth Once A Day 8)  Tylenol Extra Strength 500 Mg  Tabs (Acetaminophen) .... 2 By Mouth Three Times A Day 9)  Calcium 500 Mg Tabs (Calcium Carbonate) .Marland Kitchen.. 1 Tab Once Daily 10)  Zyrtec Allergy 10 Mg Tabs (Cetirizine Hcl) .Marland Kitchen.. 1 Tab Once Daily 11)  One Touch Ultra Test Strips 12)  One Touch Ultra Soft Lancets 13)  Osteo Bi-Flex Regular Strength 250-200 Mg Tabs (Glucosamine-Chondroitin) .... 2  Tab Once Daily 14)  Miralax   Powd (Polyethylene Glycol 3350) .... Prn 15)  Nystatin-Triamcinolone 100000-0.1  Unit/gm-%  Crea (Nystatin-Triamcinolone) .... Apply Sparingly  Twice Daily To Affected Areas 16)  Lipitor 20 Mg Tabs (Atorvastatin Calcium) .... Take 1 Tab Once Daily At Bedtime 17)  Icaps Areds Formula  Tabs (Multiple Vitamins-Minerals) .Marland Kitchen.. 1 Tab Two Times A Day 18)  Ondansetron 8 Mg Tbdp (Ondansetron) .Marland Kitchen.. 1 Tab By Mouth Q8 As Needed For Nausea 19)  Fenofibrate 160 Mg Tabs (Fenofibrate) .... Take 1 Tab Once Daily 20)  Humulin R 100 Unit/ml Soln (Insulin Regular Human) .... Three Times A Day (Just Before Each Meal) 02-08-34 Units 21)  Thinpro Insulin Syringe 31g X 3/8" 0.3 Ml Misc (Insulin Syringe-Needle U-100) .... Any Brand, 4x A Day 22)  Benadryl 25 Mg Tabs (Diphenhydramine Hcl) .... Take One Tablet Daily 23)  Cyclobenzaprine Hcl 10 Mg  Tabs (Cyclobenzaprine Hcl) .Marland Kitchen.. 1 By Mouth 2 Times Daily As Needed For Back Pain.  Will Cause Drowsiness  Allergies (verified): 1)  ! Penicillin 2)  ! Coumadin 3)  ! Beta Blockers 4)  ! Keflex (Cephalexin)  Past History:  Past Medical History: Last updated: 11/05/2008 PAROXYSMAL ATRIAL FIBRILLATION (ICD-427.31) HYPERCHOLESTEROLEMIA (ICD-272.0) HYPERTENSION, BENIGN SYSTEMIC (ICD-401.1) FATIGUE (ICD-780.79) HYPERKALEMIA (ICD-276.7) RENAL FAILURE (ICD-586) RENAL INSUFFICIENCY, CHRONIC (ICD-585.9) RHINITIS (ICD-477.9) PROTEINURIA (ICD-791.0)  DERMATOPHYTOSIS OF THE BODY (ICD-110.5) ENCOUNTER FOR LONG-TERM USE OF OTHER MEDICATIONS (ICD-V58.69) HEALTH SCREENING (ICD-V70.0) UNSPECIFIED CONSTIPATION (ICD-564.00) DIABETES MELLITUS II, UNCOMPLICATED (ICD-250.00) PERIPHERAL NEUROPATHY, 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    Physical Exam  General:  normal appearance.   Psych:  Alert and cooperative;  normal mood and affect; normal attention span and concentration.   Additional Exam:  Hemoglobin A1C       [H]  6.7 %    Impression & Recommendations:  Problem # 1:  DIABETES MELLITUS II, UNCOMPLICATED (ICD-250.00) i believe the episodes in the middle of the night are due to suppertime reg insulin  Medications Added to Medication List This Visit: 1)  Humulin R 100 Unit/ml Soln (Insulin regular human) .... Three times a day (just before each meal) 02-08-29 units  Other Orders: TLB-A1C / Hgb A1C (Glycohemoglobin) (83036-A1C) Est. Patient Level III (16109)  Patient Instructions: 1)  check your blood sugar 2 times a day.  vary the time of day when you check, between before the 3 meals, and at bedtime.  also check if you have symptoms of your blood sugar being too high or too low.  please keep a record of the readings and bring it to your next appointment here.  please call us sooner if you are having low blood sugar episodes. 2)  decrease regular insulin to three times a day (just before each meal), 02-08-29 units. 3)  continue nph insulin, 30 units at bedtime. 4)  Please schedule a follow-up appointment in 3 months. 5)  you should lay out you testing meter, and a strip at bedtime, and check if you awaken with symptoms in the middle of the night.   6)  (update: i left message on phone-tree:  rx as we discussed)   Orders Added: 1)  TLB-A1C / Hgb A1C (Glycohemoglobin) [83036-A1C] 2)  Est. Patient Level III [60454]

## 2010-05-29 NOTE — Assessment & Plan Note (Signed)
Summary: RECHECK BP AND BMP///SPH   Nurse Visit   Vital Signs:  Patient profile:   77 year old female Height:      66 inches Weight:      258 pounds BMI:     41.79 Pulse rate:   66 / minute BP sitting:   136 / 64  (left arm)  Vitals Entered By: Doristine Devoid (October 02, 2009 3:53 PM)  CC: bp check and bmp   Allergies: 1)  ! Penicillin 2)  ! Coumadin 3)  ! Beta Blockers  Orders Added: 1)  Venipuncture [36415] 2)  TLB-BMP (Basic Metabolic Panel-BMET) [80048-METABOL] 3)  Est. Patient Level I [66440]    Colorectal Screening   Hemoccult: Not documented    Colonoscopy: Not documented  Other Screening   Pap smear: Not documented    Mammogram: Not documented    DXA bone density scan: Not documented   Smoking status: quit  (09/29/2008)  Diabetes Mellitus   HgbA1C: 7.1  (09/11/2009)    Eye exam: normal  (07/02/2009)   Eye exam due: 06/2010    Foot exam: Not documented   High risk foot: Not documented   Foot care education: Not documented    Urine microalbumin/creatinine ratio: 216.1  (06/17/2009)  Lipids   Total Cholesterol: 178  (06/17/2009)   LDL: 82  (05/16/2008)   LDL Direct: 94.0  (06/17/2009)   HDL: 58.70  (06/17/2009)   Triglycerides: 234.0  (06/17/2009)    SGOT (AST): 19  (06/17/2009)   SGPT (ALT): 23  (06/17/2009)   Alkaline phosphatase: 35  (06/17/2009)   Total bilirubin: 0.3  (06/17/2009)  Hypertension   Last Blood Pressure: 136 / 64  (10/02/2009)   Serum creatinine: 1.2  (09/11/2009)   Serum potassium 4.1  (09/11/2009)  Self-Management Support :    Diabetes self-management support: Not documented    Hypertension self-management support: Not documented    Lipid self-management support: Not documented

## 2010-05-29 NOTE — Progress Notes (Signed)
Summary: bumex refill   Phone Note Refill Request Message from:  Fax from Pharmacy on December 26, 2009 8:48 AM  Refills Requested: Medication #1:  BUMEX 1 MG  TABS 1 tab by mouth two times a day. [facility] [address] - fax [phone]  Initial call taken by: Okey Regal Spring,  December 26, 2009 8:49 AM    Prescriptions: BUMEX 1 MG  TABS (BUMETANIDE) 1 tab by mouth two times a day.  #60 x 6   Entered by:   Doristine Devoid CMA   Authorized by:   Neena Rhymes MD   Signed by:   Doristine Devoid CMA on 12/26/2009   Method used:   Electronically to        [facility] [address] #[id number]* (retail)       [address]       [place], [place]  [address]       Ph: [phone]       Fax: [phone]   RxID:   [id number]

## 2010-05-29 NOTE — Progress Notes (Signed)
Summary: lab results--lmom  Phone Note Outgoing Call   Summary of Call: Please call patient and let her know that her A1C is higher this visit 7.2.  I would like her to start NPH 20units in the morning before breakfast and 40 units in the evening.  She should check sugars 3 times daily for the next week and record these readings.  She should call if she gets readings less than 80 or over 250.  Ask patient to call us with these values in 1 week.  She should arrange f/u visit in 1 month.   Initial call taken by: Lemont Fillers FNP,  June 17, 2009 10:22 PM  Follow-up for Phone Call        left message on machine to call re: lab results Follow-up by: Pearletha Furl CMA,  June 18, 2009 9:21 AM  Additional Follow-up for Phone Call Additional follow up Details #1::        Pt states she is currently taking 66 units Humulin N at bedtime. Please verify NPH instructions. Additional Follow-up by: Mervin Kung CMA,  June 19, 2009 11:25 AM    Additional Follow-up for Phone Call Additional follow up Details #2::    Will do 20units NPH in AM and 46 units in PM.  f/u in 1 month please Follow-up by: Lemont Fillers FNP,  June 19, 2009 12:39 PM  Additional Follow-up for Phone Call Additional follow up Details #3:: Details for Additional Follow-up Action Taken: Pt notified and will follow up with Zayan Delvecchio on 07/17/09 at [facility]. Additional Follow-up by: Mervin Kung CMA,  June 19, 2009 5:02 PM

## 2010-05-29 NOTE — Progress Notes (Signed)
Summary: losartan refill   Phone Note Refill Request Call back at [phone] Message from:  Pharmacy on October 24, 2009 8:14 AM  Refills Requested: Medication #1:  LOSARTAN POTASSIUM-HCTZ 100-12.5 MG TABS one tablet by mouth daily   Dosage confirmed as above?Dosage Confirmed   Supply Requested: 1 month   Last Refilled: 09/19/2009 [facility] [address]  Next Appointment Scheduled: SEPT. 8TH 2011 Initial call taken by: Lavell Islam,  October 24, 2009 8:14 AM    Prescriptions: LOSARTAN POTASSIUM-HCTZ 100-12.5 MG TABS (LOSARTAN POTASSIUM-HCTZ) one tablet by mouth daily  #30 x 4   Entered by:   Doristine Devoid   Authorized by:   Neena Rhymes MD   Signed by:   Doristine Devoid on 10/24/2009   Method used:   Electronically to        [facility] [address] #[id number]* (retail)       [address]       [place], [place]  [address]       Ph: [phone]       Fax: [phone]   RxID:   [id number]

## 2010-05-29 NOTE — Progress Notes (Signed)
Summary: Avalide a Non-Preferred drug--[facility]  Phone Note Refill Request Message from:  Fax from Pharmacy on June 19, 2009 8:54 AM  Refills Requested: Medication #1:  AVALIDE 300-25 MG  TABS one tab by mouth qd [facility] [address] FAX [phone] Everlean Alstrom [phone]   Method Requested: Fax to Local Pharmacy Next Appointment Scheduled: 5.18.2011 Initial call taken by: Barb Merino,  June 19, 2009 8:55 AM  Follow-up for Phone Call        Avalide a NON-PREFERRED drug; some possible alternatives are: Losartan, Losartan/Hctz, Diovan, Diovan/Hct, Micardis, Micardis/HCT  are any of these an option for this patient?  Follow-up by: Kandice Hams,  June 20, 2009 12:17 PM  Additional Follow-up for Phone Call Additional follow up Details #1::        Please call patient and let her know I will switch avalide  to losartan/hctz- patient needs to follow up for bp check and lab draw in 2 weeks (401.1) Additional Follow-up by: Lemont Fillers FNP,  June 20, 2009 3:48 PM    Additional Follow-up for Phone Call Additional follow up Details #2::    left message on machine to return call. Follow-up by: Mervin Kung CMA,  June 21, 2009 8:33 AM  Additional Follow-up for Phone Call Additional follow up Details #3:: Details for Additional Follow-up Action Taken: Pt made aware of med change and need to schedule 2 week f/u. Pt. states that she doesn't feel comfortable changing BP med at this time. Wants to get BS under control first. She states she will keep her follow up in one month. Additional Follow-up by: Mervin Kung CMA,  June 21, 2009 9:58 AM  New/Updated Medications: LOSARTAN POTASSIUM-HCTZ 100-12.5 MG TABS (LOSARTAN POTASSIUM-HCTZ) one tablet by mouth daily Prescriptions: LOSARTAN POTASSIUM-HCTZ 100-12.5 MG TABS (LOSARTAN POTASSIUM-HCTZ) one tablet by mouth daily  #30 x 0   Entered and Authorized by:   Lemont Fillers FNP   Signed by:   Lemont Fillers FNP on 06/20/2009   Method used:   Electronically to        [facility] #[id number]* (retail)       [address]       [place], [place]  [address]       Ph: [phone]       Fax: [phone]   RxID:   [id number]

## 2010-05-29 NOTE — Miscellaneous (Signed)
   Clinical Lists Changes  Observations: Added new observation of DMEYEEXAMNXT: 06/2010 (07/03/2009 11:42) Added new observation of DMEYEEXMRES: normal (07/02/2009 11:45) Added new observation of EYE EXAM BY: [facility] (07/02/2009 11:45) Added new observation of DIAB EYE EX: normal (07/02/2009 11:45)        Diabetes Management Exam:    Eye Exam:       Eye Exam done elsewhere          Date: 07/02/2009          Results: normal          Done by: [facility]
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
## 2010-05-29 NOTE — Progress Notes (Signed)
Summary: hydralazine refill   Phone Note Refill Request Message from:  Fax from Pharmacy on February 06, 2010 8:39 AM  Refills Requested: Medication #1:  HYDRALAZINE HCL 50 MG TABS Take 1 tablet by mouth three times a day.    Prescriptions: HYDRALAZINE HCL 50 MG TABS (HYDRALAZINE HCL) Take 1 tablet by mouth three times a day.  #90 x 1   Entered by:   Doristine Devoid CMA   Authorized by:   Neena Rhymes MD   Signed by:   Doristine Devoid CMA on 02/06/2010   Method used:   Electronically to        [facility] [address] #[id number]* (retail)       [address]       [place], [place]  [address]       Ph: [phone]       Fax: [phone]   RxID:   [id number]

## 2010-05-29 NOTE — Assessment & Plan Note (Signed)
Summary: 3 month roa//lch   Vital Signs:  Patient profile:   77 year old female Height:      66 inches Weight:      260 pounds Pulse rate:   80 / minute BP sitting:   174 / 84  Vitals Entered By: [name] (Sep 11, 2009 4:00 PM)  Serial Vital Signs/Assessments:  Time      Position  BP       Pulse  Resp  Temp     By                     166/60                         Shelia Rhymes MD  CC: rov   History of Present Illness: 77 yo woman here today for f/u on  1) DM- pt now taking 74 units of insulin nightly.  fasting this AM was 104.  only checking CBGs once daily.  no symptomatic lows.  UTD on eye exam.  due for A1C  2) HTN- BP was well controlled at Cards and last visit here.  no CP, SOB is at baseline.  + edema (see below).  3) L leg edema- on Bumex and HCTZ.  no improvement in edema w/ leg elevation.  wearing support hose daily.  denies leg pain or recent immobility.  Problems Prior to Update: 1)  Dizziness  (ICD-780.4) 2)  Small Bowel Obstruction  (ICD-560.9) 3)  Paroxysmal Atrial Fibrillation  (ICD-427.31) 4)  Hypercholesterolemia  (ICD-272.0) 5)  Hypertension, Benign Systemic  (ICD-401.1) 6)  Fatigue  (ICD-780.79) 7)  Hyperkalemia  (ICD-276.7) 8)  Renal Failure  (ICD-586) 9)  Renal Insufficiency, Chronic  (ICD-585.9) 10)  Rhinitis  (ICD-477.9) 11)  Proteinuria  (ICD-791.0) 12)  Dermatophytosis of The Body  (ICD-110.5) 13)  Encounter For Long-term Use of Other Medications  (ICD-V58.69) 14)  Health Screening  (ICD-V70.0) 15)  Unspecified Constipation  (ICD-564.00) 16)  Diabetes Mellitus II, Uncomplicated  (ICD-250.00) 17)  Peripheral Neuropathy, Lower Extremity  (ICD-355.8) 18)  Osteoarthritis, Lower Leg  (ICD-715.96) 19)  Back Pain, Low  (ICD-724.2) 20)  Syndrome, Carpal Tunnel  (ICD-354.0) 21)  Neop, Malignant, Skin Nos  (ICD-173.9) 22)  Ingrown Toenail, Infected  (ICD-703.0)  Current Medications (verified): 1)  Bumex 1 Mg  Tabs (Bumetanide) .Marland Kitchen.. 1 Tab  By Mouth Two Times A Day. 2)  Hydralazine Hcl 50 Mg  Tabs (Hydralazine Hcl) .... One By Mouth 2 Times A Day 3)  Clonidine Hcl 0.3 Mg  Tabs (Clonidine Hcl) .Marland Kitchen.. 1 By Mouth Bid 4)  Diltiazem Hcl Er Beads 300 Mg Xr24h-Cap (Diltiazem Hcl Er Beads) .... One Tab By Mouth Once Daily 5)  Losartan Potassium-Hctz 100-12.5 Mg Tabs (Losartan Potassium-Hctz) .... One Tablet By Mouth Daily 6)  Humulin N 100 Unit/ml  Susp (Insulin Isophane Human) .... 60 Units Daily in The Evening 7)  Aspirin Childrens 81 Mg Chew (Aspirin) .... Take 2 Tablet By Mouth Once A Day 8)  Tylenol Extra Strength 500 Mg  Tabs (Acetaminophen) .... 2 By Mouth Three Times A Day 9)  Calcium 500 Mg Tabs (Calcium Carbonate) .Marland Kitchen.. 1 Tab Once Daily 10)  Zyrtec Allergy 10 Mg Tabs (Cetirizine Hcl) .Marland Kitchen.. 1 Tab Once Daily 11)  One Touch Ultra Test Strips 12)  [facility] Syringe/ndl 31g 0.51ml 8mm .... As Directed 13)  One Touch Ultra Soft Lancets 14)  Osteo Bi-Flex Regular Strength 250-200 Mg Tabs (Glucosamine-Chondroitin) .Marland KitchenMarland KitchenMarland Kitchen  2  Tab Once Daily 15)  Miralax   Powd (Polyethylene Glycol 3350) .... Prn 16)  Nystatin-Triamcinolone 100000-0.1 Unit/gm-%  Crea (Nystatin-Triamcinolone) .... Apply Sparingly  Twice Daily To Affected Areas After Sitz Bath 17)  Simvastatin 40 Mg Tabs (Simvastatin) .... One Tab By Mouth Once Daily 18)  Icaps Areds Formula  Tabs (Multiple Vitamins-Minerals) .Marland Kitchen.. 1 Tab Two Times A Day 19)  Bumetanide 1 Mg Tabs (Bumetanide) .Marland Kitchen.. 1 Tab Once Daily  Allergies (verified): 1)  ! Penicillin 2)  ! Coumadin 3)  ! Beta Blockers  Review of Systems General:  Denies chills, fatigue, fever, loss of appetite, and malaise. Eyes:  Denies blurring and double vision. CV:  Complains of shortness of breath with exertion and swelling of feet; denies chest pain or discomfort, difficulty breathing while lying down, fainting, fatigue, palpitations, swelling of hands, and weight gain. Resp:  Denies cough and shortness of breath. GI:  Denies  abdominal pain, nausea, and vomiting. Neuro:  Denies headaches.  Physical Exam  General:  Well-developed,well-nourished,in no acute distress; alert,appropriate and cooperative throughout examination Lungs:  Normal respiratory effort, chest expands symmetrically. Lungs are clear to auscultation, no crackles or wheezes. Heart:  Normal rate and regular rhythm. S1 and S2 normal  Abdomen:  soft, NT/ND, +BS Pulses:  +2 carotid, radial, DP Extremities:  +1 left pedal edema and trace right pedal edema. 2+ bilateral DP pulses   Impression & Recommendations:  Problem # 1:  HYPERTENSION, BENIGN SYSTEMIC (ICD-401.1) Assessment Unchanged BP slightly higher than last visit.  pt very sensitive to meds and has many intolerances.  pt and husband report that cards tells them that '150 is good for her'.  will increase Bumex due to edema which may lower BP.  will follow closely. Her updated medication list for this problem includes:    Bumex 1 Mg Tabs (Bumetanide) .Marland Kitchen... 1 tab by mouth two times a day.    Hydralazine Hcl 50 Mg Tabs (Hydralazine hcl) ..... One by mouth 2 times a day    Clonidine Hcl 0.3 Mg Tabs (Clonidine hcl) .Marland Kitchen... 1 by mouth bid    Diltiazem Hcl Er Beads 300 Mg Xr24h-cap (Diltiazem hcl er beads) ..... One tab by mouth once daily    Losartan Potassium-hctz 100-12.5 Mg Tabs (Losartan potassium-hctz) ..... One tablet by mouth daily    Bumetanide 1 Mg Tabs (Bumetanide) .Marland Kitchen... 1 tab once daily  Problem # 2:  DIABETES MELLITUS II, UNCOMPLICATED (ICD-250.00) Assessment: Unchanged in reviewing cards note they want pt to have tighter glucose control.  when pt had meds changed this winter was experiencing symptomatic lows.  will check A1C and determine what med changes are necessary/possible.  given pt's poor mobility and age would like to have pt run slightly high rather than low. Her updated medication list for this problem includes:    Losartan Potassium-hctz 100-12.5 Mg Tabs (Losartan  potassium-hctz) ..... One tablet by mouth daily    Humulin N 100 Unit/ml Susp (Insulin isophane human) .Marland KitchenMarland KitchenMarland KitchenMarland Kitchen 60 units daily in the evening    Aspirin Childrens 81 Mg Chew (Aspirin) .Marland Kitchen... Take 2 tablet by mouth once a day  Orders: Venipuncture (19147) TLB-A1C / Hgb A1C (Glycohemoglobin) (83036-A1C)  Problem # 3:  RENAL INSUFFICIENCY, CHRONIC (ICD-585.9) Assessment: Unchanged pt w/ Cr fluctuating between 1.2 and 2.  given increase in Bumex will check BMP today and again at nurse visit in 3 weeks. Orders: TLB-BMP (Basic Metabolic Panel-BMET) (80048-METABOL)  Problem # 4:  EDEMA- LOCALIZED (ICD-782.3) Assessment: New  will increase  Bumex to two times a day. Her updated medication list for this problem includes:    Bumex 1 Mg Tabs (Bumetanide) .Marland Kitchen... 1 tab by mouth two times a day.    Losartan Potassium-hctz 100-12.5 Mg Tabs (Losartan potassium-hctz) ..... One tablet by mouth daily    Bumetanide 1 Mg Tabs (Bumetanide) .Marland Kitchen... 1 tab once daily  Orders: Prescription Created Electronically [id number])  Complete Medication List: 1)  Bumex 1 Mg Tabs (Bumetanide) .Marland Kitchen.. 1 tab by mouth two times a day. 2)  Hydralazine Hcl 50 Mg Tabs (Hydralazine hcl) .... One by mouth 2 times a day 3)  Clonidine Hcl 0.3 Mg Tabs (Clonidine hcl) .Marland Kitchen.. 1 by mouth bid 4)  Diltiazem Hcl Er Beads 300 Mg Xr24h-cap (Diltiazem hcl er beads) .... One tab by mouth once daily 5)  Losartan Potassium-hctz 100-12.5 Mg Tabs (Losartan potassium-hctz) .... One tablet by mouth daily 6)  Humulin N 100 Unit/ml Susp (Insulin isophane human) .... 60 units daily in the evening 7)  Aspirin Childrens 81 Mg Chew (Aspirin) .... Take 2 tablet by mouth once a day 8)  Tylenol Extra Strength 500 Mg Tabs (Acetaminophen) .... 2 by mouth three times a day 9)  Calcium 500 Mg Tabs (Calcium carbonate) .Marland Kitchen.. 1 tab once daily 10)  Zyrtec Allergy 10 Mg Tabs (Cetirizine hcl) .Marland Kitchen.. 1 tab once daily 11)  One Touch Ultra Test Strips  12)  [facility] Syringe/ndl  31g 0.42ml 8mm  .... As directed 13)  One Touch Ultra Soft Lancets  14)  Osteo Bi-flex Regular Strength 250-200 Mg Tabs (Glucosamine-chondroitin) .... 2  tab once daily 15)  Miralax Powd (Polyethylene glycol 3350) .... Prn 16)  Nystatin-triamcinolone 100000-0.1 Unit/gm-% Crea (Nystatin-triamcinolone) .... Apply sparingly  twice daily to affected areas after sitz bath 17)  Simvastatin 40 Mg Tabs (Simvastatin) .... One tab by mouth once daily 18)  Icaps Areds Formula Tabs (Multiple vitamins-minerals) .Marland Kitchen.. 1 tab two times a day 19)  Bumetanide 1 Mg Tabs (Bumetanide) .Marland Kitchen.. 1 tab once daily  Patient Instructions: 1)  Please schedule a nurse visit in 3-4 weeks to recheck blood pressure and BMP. 2)  Increase the Bumex to two times a day for the swelling 3)  Elevate your leg as much as possible 4)  We'll notify you of your A1C and make any changes at that time 5)  Call with any questions or concerns 6)  THANK YOU FOR THE BEAUTIFUL BLANKET!  Prescriptions: BUMEX 1 MG  TABS (BUMETANIDE) 1 tab by mouth two times a day.  #60 x 3   Entered and Authorized by:   Shelia Rhymes MD   Signed by:   Shelia Rhymes MD on 09/11/2009   Method used:   Electronically to        [facility] #[id number]* (retail)       [address]       [place], [place]  [address]       Ph: [phone]       Fax: [phone]   RxID:   [id number]

## 2010-05-29 NOTE — Progress Notes (Signed)
Summary: refills--simvastatin, hydralazine  Phone Note Refill Request Message from:  [facility] [phone] on 10/16/09 6:03pm  Refills Requested: Medication #1:  SIMVASTATIN 40 MG TABS one tab by mouth once daily   Dosage confirmed as above?Dosage Confirmed   Supply Requested: 1 month   Last Refilled: 09/13/2009  Medication #2:  HYDRALAZINE HCL 50 MG  TABS one by mouth 2 times a day   Dosage confirmed as above?Dosage Confirmed   Supply Requested: 1 month   Last Refilled: 09/12/2009 Next Appointment Scheduled: 01-02-10 Dr. Beverely Low Initial call taken by: Mervin Kung CMA,  October 17, 2009 8:53 AM    Prescriptions: SIMVASTATIN 40 MG TABS (SIMVASTATIN) one tab by mouth once daily  #30 x 2   Entered by:   Mervin Kung CMA   Authorized by:   Neena Rhymes MD   Signed by:   Mervin Kung CMA on 10/17/2009   Method used:   Electronically to        [facility] #[id number]* (retail)       [address]       [place], [place]  [address]       Ph: [phone]       Fax: [phone]   RxID:   [id number] HYDRALAZINE HCL 50 MG  TABS (HYDRALAZINE HCL) one by mouth 2 times a day  #60 x 2   Entered by:   Mervin Kung CMA   Authorized by:   Neena Rhymes MD   Signed by:   Mervin Kung CMA on 10/17/2009   Method used:   Electronically to        [facility] #[id number]* (retail)       [address]       [place], [place]  [address]       Ph: [phone]       Fax: [phone]   RxID:   [id number]

## 2010-05-29 NOTE — Assessment & Plan Note (Signed)
Summary: 2 MTH FU/KDC   Vital Signs:  Patient profile:   77 year old female Weight:      256 pounds Pulse rate:   74 / minute BP sitting:   140 / 62  (left arm)  Vitals Entered By: Doristine Devoid (June 17, 2009 10:15 AM) CC: 2 month roa    Primary Care Provider:  Neena Rhymes MD  CC:  2 month roa .  History of Present Illness: Shelia Woods is a 77 year old female who present today for follow up.  HTN- last visit her clonidine was increased from two times a day to three times a day dosing schedule.  Notes BP was 133/69 yesterday at home.  Follows a low sodium diet.  Denies headaches or visual changes  Hyperlipidemia- continues on simvastatin and fenofibrate.  Follows a low cholesterol diet  DM2- has appoinment with eye dr. 3/1,  has never seen podiatrist.  Declines referral at this time.  Notes that her fasting sugars are sometimes as high as 150's,  she does not check later in the evening.  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  Tells me that she takes humulin N at 10PM only    Allergies: 1)  ! Penicillin 2)  ! Coumadin 3)  ! Beta Blockers  Physical Exam  General:  Well-developed,well-nourished,in no acute distress; alert,appropriate and cooperative throughout examination Head:  Normocephalic and atraumatic without obvious abnormalities. No apparent alopecia or balding. Lungs:  Normal respiratory effort, chest expands symmetrically. Lungs are clear to auscultation, no crackles or wheezes. Heart:  Normal rate and regular rhythm. S1 and S2 normal without gallop, murmur, click, rub or other extra sounds. Extremities:  trace left pedal edema and trace right pedal edema. patient refused removal of support hose, however no clear foot lesions on limited exam, 2+ bilateral DP pulses   Impression & Recommendations:  Problem # 1:  DIABETES MELLITUS II, UNCOMPLICATED (ICD-250.00) Assessment Comment Only She is on an unusual regimen of humulin N only given  in PM with such a high dose.  Will plan to check A1C, if worsening, consider changing to a two times a day schedule Her updated medication list for this problem includes:    Avalide 300-25 Mg Tabs (Irbesartan-hydrochlorothiazide) ..... One tab by mouth qd    Humulin N 100 Unit/ml Susp (Insulin isophane human) .Marland KitchenMarland KitchenMarland KitchenMarland Kitchen 60 units daily in the evening    Aspirin Childrens 81 Mg Chew (Aspirin) .Marland Kitchen... Take 2 tablet by mouth once a day  Orders: Venipuncture ([address]109) TLB-BMP (Basic Metabolic Panel-BMET) (80048-METABOL) TLB-A1C / Hgb A1C (Glycohemoglobin) (83036-A1C) TLB-Microalbumin/Creat Ratio, Urine (82043-MALB)  Problem # 2:  HYPERTENSION, BENIGN SYSTEMIC (ICD-401.1) Assessment: Deteriorated Has not taken all of her AM meds, will continue current regimen and monitor.  She does express concern with cost of avalide.  Lisinopril + HCTZ is a cheaper alternative, but she would need close monitoring due to history of hyperkalemia.  She will think about this and let me know if she wishes to persue this change.  Her updated medication list for this problem includes:    Bumex 1 Mg Tabs (Bumetanide) ..... Qd    Hydralazine Hcl 50 Mg Tabs (Hydralazine hcl) ..... One by mouth 3 times a day    Clonidine Hcl 0.3 Mg Tabs (Clonidine hcl) .Marland Kitchen... 1 by mouth tid    Diltiazem Hcl Er Beads 300 Mg Xr24h-cap (Diltiazem hcl er beads) ..... One tab by mouth once daily    Avalide 300-25 Mg Tabs (  Irbesartan-hydrochlorothiazide) ..... One tab by mouth qd  BP today: 140/62 Prior BP: 136/60 (04/15/2009)  Prior 10 Yr Risk Heart Disease: 13 % (04/15/2009)  Labs Reviewed: K+: 4.3 (03/15/2009) Creat: : 1.6 (03/15/2009)   Chol: 201 (03/15/2009)   HDL: 52.90 (03/15/2009)   LDL: 82 (05/16/2008)   TG: 215.0 (03/15/2009)  Problem # 3:  HYPERCHOLESTEROLEMIA (ICD-272.0) Assessment: Comment Only check lipids today Her updated medication list for this problem includes:    Simvastatin 40 Mg Tabs (Simvastatin) ..... One tab by mouth  once daily    Fenofibrate 160 Mg Tabs (Fenofibrate) .Marland Kitchen... 1 by mouth once daily  Labs Reviewed: SGOT: 21 (01/31/2009)   SGPT: 22 (01/31/2009)  Lipid Goals: Chol Goal: 200 (04/15/2009)   HDL Goal: 40 (04/15/2009)   LDL Goal: 100 (04/15/2009)   TG Goal: 150 (04/15/2009)  Prior 10 Yr Risk Heart Disease: 13 % (04/15/2009)   HDL:52.90 (03/15/2009), 48.90 (12/12/2008)  LDL:82 (05/16/2008), DEL (16/01/9603)  Chol:201 (03/15/2009), 190 (12/12/2008)  Trig:215.0 (03/15/2009), 264.0 (12/12/2008)  Orders: TLB-Lipid Panel (80061-LIPID) TLB-Hepatic/Liver Function Pnl (80076-HEPATIC)  Complete Medication List: 1)  Bumex 1 Mg Tabs (Bumetanide) .... Qd 2)  Hydralazine Hcl 50 Mg Tabs (Hydralazine hcl) .... One by mouth 3 times a day 3)  Clonidine Hcl 0.3 Mg Tabs (Clonidine hcl) .Marland Kitchen.. 1 by mouth tid 4)  Diltiazem Hcl Er Beads 300 Mg Xr24h-cap (Diltiazem hcl er beads) .... One tab by mouth once daily 5)  Avalide 300-25 Mg Tabs (Irbesartan-hydrochlorothiazide) .... One tab by mouth qd 6)  Humulin N 100 Unit/ml Susp (Insulin isophane human) .... 60 units daily in the evening 7)  Aspirin Childrens 81 Mg Chew (Aspirin) .... Take 2 tablet by mouth once a day 8)  Tylenol Extra Strength 500 Mg Tabs (Acetaminophen) .... 2 by mouth three times a day 9)  Centrum Silver Tabs (Multiple vitamins-minerals) .Marland Kitchen.. 1 tab once daily 10)  Calcium 500 Mg Tabs (Calcium carbonate) .Marland Kitchen.. 1 tab once daily 11)  Zyrtec Allergy 10 Mg Tabs (Cetirizine hcl) .Marland Kitchen.. 1 tab once daily 12)  One Touch Ultra Test Strips  13)  [facility] Syringe/ndl 31g 0.74ml 8mm  .... As directed 14)  One Touch Ultra Soft Lancets  15)  Osteo Bi-flex Regular Strength 250-200 Mg Tabs (Glucosamine-chondroitin) .... 2  tab once daily 16)  Miralax Powd (Polyethylene glycol 3350) .... Prn 17)  Nystatin-triamcinolone 100000-0.1 Unit/gm-% Crea (Nystatin-triamcinolone) .... Apply sparingly  twice daily to affected areas after sitz bath 18)  Simvastatin 40 Mg Tabs  (Simvastatin) .... One tab by mouth once daily 19)  Fenofibrate 160 Mg Tabs (Fenofibrate) .Marland Kitchen.. 1 by mouth once daily  Patient Instructions: 1)  Complete your lab work prior to leaving today. 2)  Please check your sugar twice daily. 3)  Please schedule a follow-up appointment in 3 months. Prescriptions: [facility] SYRINGE/NDL 31G 0.3ML as directed  #100 x 2   Entered and Authorized by:   Lemont Fillers FNP   Signed by:   Lemont Fillers FNP on 06/17/2009   Method used:   Print then Give to Patient   RxID:   [id number] ONE TOUCH ULTRA SOFT LANCETS   #1 box x 2   Entered and Authorized by:   Lemont Fillers FNP   Signed by:   Lemont Fillers FNP on 06/17/2009   Method used:   Print then Give to Patient   RxID:   [id number] ONE TOUCH ULTRA TEST STRIPS   #100 x 2   Entered and Authorized by:  Lemont Fillers FNP   Signed by:   Lemont Fillers FNP on 06/17/2009   Method used:   Print then Give to Patient   RxID:   [id number] FENOFIBRATE 160 MG TABS (FENOFIBRATE) 1 by mouth once daily  #30 x 3   Entered and Authorized by:   Lemont Fillers FNP   Signed by:   Lemont Fillers FNP on 06/17/2009   Method used:   Electronically to        [facility] #[id number]* (retail)       [address]       [place], [place]  [address]       Ph: [phone]       Fax: [phone]   RxID:   [id number] SIMVASTATIN 40 MG TABS (SIMVASTATIN) one tab by mouth once daily  #30 x 3   Entered and Authorized by:   Lemont Fillers FNP   Signed by:   Lemont Fillers FNP on 06/17/2009   Method used:   Electronically to        [facility] #[id number]* (retail)       [address]       [place], [place]  [address]       Ph: [phone]       Fax: [phone]   RxID:   [id number] HUMULIN N 100 UNIT/ML  SUSP (INSULIN ISOPHANE HUMAN) 60 units daily in  the evening  #2 x 3   Entered and Authorized by:   Lemont Fillers FNP   Signed by:   Lemont Fillers FNP on 06/17/2009   Method used:   Electronically to        [facility] #[id number]* (retail)       [address]       [place], [place]  [address]       Ph: [phone]       Fax: [phone]   RxID:   [id number] AVALIDE 300-25 MG  TABS (IRBESARTAN-HYDROCHLOROTHIAZIDE) one tab by mouth qd  #30 x 3   Entered and Authorized by:   Lemont Fillers FNP   Signed by:   Lemont Fillers FNP on 06/17/2009   Method used:   Electronically to        [facility] #[id number]* (retail)       [address]       [place], [place]  [address]       Ph: [phone]       Fax: [phone]   RxID:   [id number] DILTIAZEM HCL ER BEADS 300 MG XR24H-CAP (DILTIAZEM HCL ER BEADS) one tab by mouth once daily  #30 x 3   Entered and Authorized by:   Lemont Fillers FNP   Signed by:   Lemont Fillers FNP on 06/17/2009   Method used:   Electronically to        [facility] #[id number]* (retail)       [address]       [place], [place]  [address]       Ph: [phone]       Fax: [phone]   RxID:   [id number] CLONIDINE HCL  0.3 MG  TABS (CLONIDINE HCL) 1 by mouth Tid  #90 x 3   Entered and Authorized by:   Lemont Fillers FNP   Signed by:   Lemont Fillers FNP on 06/17/2009   Method used:   Electronically to        [facility] #[id number]* (retail)       [address]       [place], [place]  [address]       Ph: [phone]       Fax: [phone]   RxID:   [id number] HYDRALAZINE HCL 50 MG  TABS (HYDRALAZINE HCL) one by mouth 3 times a day  #90.0 Each x 3   Entered and Authorized by:   Lemont Fillers FNP   Signed by:   Lemont Fillers FNP on 06/17/2009   Method used:    Electronically to        [facility] #[id number]* (retail)       [address]       [place], [place]  [address]       Ph: [phone]       Fax: [phone]   RxID:   [id number] BUMEX 1 MG  TABS (BUMETANIDE) qd  #30 x 3   Entered and Authorized by:   Lemont Fillers FNP   Signed by:   Lemont Fillers FNP on 06/17/2009   Method used:   Electronically to        [facility] #[id number]* (retail)       [address]       [place], [place]  [address]       Ph: [phone]       Fax: [phone]   RxID:   [id number]

## 2010-05-29 NOTE — Assessment & Plan Note (Signed)
Summary: bp check/cbs   Nurse Visit   Vital Signs:  Patient profile:   77 year old female Weight:      258 pounds Pulse rate:   60 / minute BP sitting:   138 / 60  (left arm)  Vitals Entered By: Doristine Devoid CMA (November 18, 2009 1:10 PM) CC: bp check    Allergies: 1)  ! Penicillin 2)  ! Coumadin 3)  ! Beta Blockers 4)  ! Keflex (Cephalexin)  Orders Added: 1)  Est. Patient Level I [40981]

## 2010-05-29 NOTE — Progress Notes (Signed)
Summary: Verna Czech Refill  Phone Note Refill Request Message from:  Fax from Pharmacy on October 31, 2009 11:11 AM  Refills Requested: Medication #1:  DILTIAZEM HCL ER BEADS 300 MG XR24H-CAP one tab by mouth once daily   Dosage confirmed as above?Dosage Confirmed   Brand Name Necessary? No   Supply Requested: 3 months  Method Requested: Electronic Next Appointment Scheduled: 01/02/2010 @ 2p Dr Beverely Low Initial call taken by: Glendell Docker CMA,  October 31, 2009 11:12 AM    Prescriptions: DILTIAZEM HCL ER BEADS 300 MG XR24H-CAP (DILTIAZEM HCL ER BEADS) one tab by mouth once daily  #30 x 3   Entered by:   Doristine Devoid   Authorized by:   Neena Rhymes MD   Signed by:   Doristine Devoid on 10/31/2009   Method used:   Electronically to        [facility] #[id number]* (retail)       [address]       [place], [place]  [address]       Ph: [phone]       Fax: [phone]   RxID:   [id number]

## 2010-05-29 NOTE — Assessment & Plan Note (Signed)
Summary: 2 wk f/u #/cd   Vital Signs:  Patient profile:   77 year old female Height:      66 inches (167.64 cm) Weight:      255.50 pounds (116.14 kg) BMI:     41.39 O2 Sat:      95 % on Room air Temp:     97.5 degrees F (36.39 degrees C) oral Pulse rate:   771 / minute BP sitting:   138 / 72  (left arm) Cuff size:   large  Vitals Entered By: Brenton Grills MA (February 10, 2010 3:18 PM)  O2 Flow:  Room air CC: 2 week F/U/refill on [facility] Insulin Syringes/aj Is Patient Diabetic? Yes   Primary Ricarda Atayde:  Neena Rhymes MD  CC:  2 week F/U/refill on [facility] Insulin Syringes/aj.  History of Present Illness: pt states she feels well in general.  she brings a record of her cbg's which i have reviewed today.  it varies from 250 (hs) to 100's (other times of day).  Current Medications (verified): 1)  Bumex 1 Mg  Tabs (Bumetanide) .Marland Kitchen.. 1 Tab By Mouth Two Times A Day. 2)  Hydralazine Hcl 50 Mg Tabs (Hydralazine Hcl) .... Take 1 Tablet By Mouth Three Times A Day. 3)  Clonidine Hcl 0.3 Mg  Tabs (Clonidine Hcl) .Marland Kitchen.. 1 By Mouth Bid 4)  Diltiazem Hcl Er Beads 300 Mg Xr24h-Cap (Diltiazem Hcl Er Beads) .... One Tab By Mouth Once Daily 5)  Losartan Potassium-Hctz 100-12.5 Mg Tabs (Losartan Potassium-Hctz) .... One Tablet By Mouth Daily 6)  Humulin N 100 Unit/ml  Susp (Insulin Isophane Human) .... 40 Units At Bedtime 7)  Aspirin Childrens 81 Mg Chew (Aspirin) .... Take 2 Tablet By Mouth Once A Day 8)  Tylenol Extra Strength 500 Mg  Tabs (Acetaminophen) .... 2 By Mouth Three Times A Day 9)  Calcium 500 Mg Tabs (Calcium Carbonate) .Marland Kitchen.. 1 Tab Once Daily 10)  Zyrtec Allergy 10 Mg Tabs (Cetirizine Hcl) .Marland Kitchen.. 1 Tab Once Daily 11)  One Touch Ultra Test Strips 12)  [facility] Syringe/ndl 31g 0.29ml 8mm .... As Directed 13)  One Touch Ultra Soft Lancets 14)  Osteo Bi-Flex Regular Strength 250-200 Mg Tabs (Glucosamine-Chondroitin) .... 2  Tab Once Daily 15)  Miralax   Powd (Polyethylene Glycol  3350) .... Prn 16)  Nystatin-Triamcinolone 100000-0.1 Unit/gm-%  Crea (Nystatin-Triamcinolone) .... Apply Sparingly  Twice Daily To Affected Areas 17)  Lipitor 20 Mg Tabs (Atorvastatin Calcium) .... Take 1 Tab Once Daily At Bedtime 18)  Icaps Areds Formula  Tabs (Multiple Vitamins-Minerals) .Marland Kitchen.. 1 Tab Two Times A Day 19)  Ondansetron 8 Mg Tbdp (Ondansetron) .Marland Kitchen.. 1 Tab By Mouth Q8 As Needed For Nausea 20)  Fenofibrate 160 Mg Tabs (Fenofibrate) .... Take 1 Tab Once Daily 21)  Humulin R 100 Unit/ml Soln (Insulin Regular Human) .... Three Times A Day (Just Before Each Meal) 08-29-13 Units  Allergies (verified): 1)  ! Penicillin 2)  ! Coumadin 3)  ! Beta Blockers 4)  ! Keflex (Cephalexin)  Past History:  Past Medical History: Last updated: 11/05/2008 PAROXYSMAL ATRIAL FIBRILLATION (ICD-427.31) HYPERCHOLESTEROLEMIA (ICD-272.0) HYPERTENSION, BENIGN SYSTEMIC (ICD-401.1) FATIGUE (ICD-780.79) HYPERKALEMIA (ICD-276.7) RENAL FAILURE (ICD-586) RENAL INSUFFICIENCY, CHRONIC (ICD-585.9) RHINITIS (ICD-477.9) PROTEINURIA (ICD-791.0) DERMATOPHYTOSIS OF THE BODY (ICD-110.5) ENCOUNTER FOR LONG-TERM USE OF OTHER MEDICATIONS (ICD-V58.69) HEALTH SCREENING (ICD-V70.0) UNSPECIFIED CONSTIPATION (ICD-564.00) DIABETES MELLITUS II, UNCOMPLICATED (ICD-250.00) PERIPHERAL NEUROPATHY, 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    Physical Exam  General:  obese.  no distress.  in wheelchair Pulses:  dp pulses are intact.  Extremities:  no deformity.  no ulcer on the feet.  feet are of normal color and temp.  trace right pedal edema, trace left pedal edema, and mycotic toenails.   Neurologic:  sensation is intact to touch on the feet, but decreased from  normal  Skin:  insulin injection sites at anterior abdomen are normal, except for a few ecchymoses   Impression & Recommendations:  Problem # 1:  DIABETES MELLITUS II, UNCOMPLICATED (ICD-250.00) needs increased rx at supper  Medications Added to Medication List This Visit: 1)  Humulin N 100 Unit/ml Susp (Insulin isophane human) .... 30 units at bedtime 2)  Humulin R 100 Unit/ml Soln (Insulin regular human) .... Three times a day (just before each meal) 02-03-29 units 3)  Thinpro Insulin Syringe 31g X 3/8" 0.3 Ml Misc (Insulin syringe-needle u-100) .... Any brand, 4x a day  Other Orders: Est. Patient Level III ([address])  Patient Instructions: 1)  check your blood sugar 2 times a day.  vary the time of day when you check, between before the 3 meals, and at bedtime.  also check if you have symptoms of your blood sugar being too high or too low.  please keep a record of the readings and bring it to your next appointment here.  please call us sooner if you are having low blood sugar episodes. 2)  we will need to take this complex situation in stages 3)  increase regular insulin to three times a day (just before each meal), 02-03-29 units. 4)  reduce nph to 30 units at bedtime. 5)  Please schedule a follow-up appointment in 2-3 weeks. Prescriptions: HUMULIN R 100 UNIT/ML SOLN (INSULIN REGULAR HUMAN) three times a day (just before each meal) 02-03-29 units  #2 vials x 11   Entered and Authorized by:   Minus Breeding MD   Signed by:   Minus Breeding MD on 02/10/2010   Method used:   Print then Give to Patient   RxID:   [id number] HUMULIN N 100 UNIT/ML  SUSP (INSULIN ISOPHANE HUMAN) 30 units at bedtime  #1 vial x 11   Entered and Authorized by:   Minus Breeding MD   Signed by:   Minus Breeding MD on 02/10/2010   Method used:   Print then Give to Patient   RxID:   [id number] HUMULIN R 100 UNIT/ML SOLN (INSULIN REGULAR HUMAN) three times a day (just before each meal) 02-03-29 units   #2 vials x 11   Entered and Authorized by:   Minus Breeding MD   Signed by:   Minus Breeding MD on 02/10/2010   Method used:   Electronically to        [facility] [address] #[id number]* (retail)       [address]       [place], [place]  [address]       Ph: [phone]       Fax: [phone]   RxID:   [id number] [facility] INSULIN SYRINGE 31G X 3/8" 0.3 ML MISC (INSULIN SYRINGE-NEEDLE U-100) any brand, 4x a day  #120 x 11   Entered and Authorized by:   Minus Breeding MD   Signed by:   Cleophas Dunker  Everardo All MD on 02/10/2010   Method used:   Electronically to        [facility] #[id number]* (retail)       [address]       [place], [place]  [address]       Ph: [phone]       Fax: [phone]   RxID:   [id number]    Orders Added: 1)  Est. Patient Level III [[address]]

## 2010-05-29 NOTE — Assessment & Plan Note (Signed)
Summary: bp check/kdc   Nurse Visit   Vital Signs:  Patient profile:   77 year old female Weight:      259 pounds BMI:     41.95 Pulse rate:   66 / minute BP sitting:   150 / 70  (left arm)  Vitals Entered By: Doristine Devoid (August 15, 2009 2:37 PM) Comments -check bp w/ patient cuff got 174/88 informed patient that cuff may not be large enought which is why she is getting elevated readings based on bp done in our office and since she saw Dr. Daleen Squibb yesterday and bp was still unchange from when we saw her and when she was seen by cards no changes .....Marland KitchenMarland KitchenDoristine Devoid  August 15, 2009 2:39 PM    Allergies: 1)  ! Penicillin 2)  ! Coumadin 3)  ! Beta Blockers  Orders Added: 1)  Est. Patient Level I [[address]]

## 2010-05-29 NOTE — Progress Notes (Signed)
Summary: nausea frequent BM, urination  Phone Note Call from Patient   Caller: Spouse Summary of Call: pt husband states pt c/o constant nausea, chills, frequent BM and urination. Pt denies any fever abdominal pain, diarrhea, or vomiting. Pt is consuming plenty of fluid  about 64 oz or more a day. Pt offer OV to come in to see o'sullivan pt husband decline states that pt can not come in because she is unable to leave house in this condition. Advise pt husband if unable to come in to office pt needs to be seen in UC/ED pt husband decline stating never mind and hung up phone.  ....Marland KitchenFelecia Deloach CMA  October 24, 2009 10:46 AM    Follow-up for Phone Call        if pt is that ill she needs to be seen.  not sure what husband was hoping for over the phone.  if no diarrhea or vomiting i'm not clear as to why pt is stuck at home. Follow-up by: Neena Rhymes MD,  October 24, 2009 11:04 AM  Additional Follow-up for Phone Call Additional follow up Details #1::        spoke w/ patient says she was unable to sleep last night due to urinary frequency says she is having chills and just not feeling well informed patient that she really needs to be seen hard to assess problems over the phone. Pt says she just can't get out of the house but told patient based on what she was explaining not sure why she is unable to leave home. Scheduled appt patient says she isn't sure whether she is going to make appt but she will try informed her she really needs to be seen especially w/ the weekend coming up ..........Marland KitchenDoristine Devoid  October 24, 2009 12:09 PM

## 2010-05-29 NOTE — Progress Notes (Signed)
Summary: refill--Clonidine  Phone Note Refill Request Message from:  Fax from Pharmacy on December 26, 2009 7:47 AM  Refills Requested: Medication #1:  CLONIDINE HCL 0.3 MG  TABS 1 by mouth bid   Dosage confirmed as above?Dosage Confirmed   Brand Name Necessary? No   Supply Requested: 1 month   Last Refilled: 10/04/2009  Method Requested: Electronic Next Appointment Scheduled: 01-02-10 200 Dr Beverely Low  Initial call taken by: Roselle Locus,  December 26, 2009 7:48 AM  Follow-up for Phone Call        ok for #60, 6 refills Follow-up by: Neena Rhymes MD,  December 26, 2009 8:51 AM    Prescriptions: CLONIDINE HCL 0.3 MG  TABS (CLONIDINE HCL) 1 by mouth bid  #60 x 6   Entered by:   Doristine Devoid CMA   Authorized by:   Neena Rhymes MD   Signed by:   Doristine Devoid CMA on 12/26/2009   Method used:   Electronically to        [facility] [address] #[id number]* (retail)       [address]       [place], [place]  [address]       Ph: [phone]       Fax: [phone]   RxID:   [id number]

## 2010-05-29 NOTE — Assessment & Plan Note (Signed)
Summary: New Endo/Medicare,uncomplicated diabetes type 2/#/cd   Vital Signs:  Patient profile:   77 year old female Height:      66 inches Weight:      256 pounds BMI:     41.47 O2 Sat:      97 % on Room air Temp:     97.6 degrees F oral Pulse rate:   88 / minute BP sitting:   188 / 76  (left arm) Cuff size:   large  Vitals Entered By: Margaret Pyle, CMA (January 27, 2010 11:22 AM)  O2 Flow:  Room air CC: New Endo - DM type II   Primary Provider:  Neena Rhymes MD  CC:  New Endo - DM type II.  History of Present Illness: pt states 25 years h/o dm.  it is complicated by peripheral neuropathy and renal dz.  she has been on insulin since dx.  she takes nph insulin 74 units qhs.  no cbg record, but states cbg's are sometimes low in the early hrs of the am. pt says his diet is "good," and exercise is limited by medical probs.   symptomatically, pt states few years of moderate numbness at the legs, and assoc pain.   pt says the cost of medications is promary concern.   Allergies: 1)  ! Penicillin 2)  ! Coumadin 3)  ! Beta Blockers 4)  ! Keflex (Cephalexin)  Past History:  Past Medical History: Last updated: 11/05/2008 PAROXYSMAL ATRIAL FIBRILLATION (ICD-427.31) HYPERCHOLESTEROLEMIA (ICD-272.0) HYPERTENSION, BENIGN SYSTEMIC (ICD-401.1) FATIGUE (ICD-780.79) HYPERKALEMIA (ICD-276.7) RENAL FAILURE (ICD-586) RENAL INSUFFICIENCY, CHRONIC (ICD-585.9) RHINITIS (ICD-477.9) PROTEINURIA (ICD-791.0) DERMATOPHYTOSIS OF THE BODY (ICD-110.5) ENCOUNTER FOR LONG-TERM USE OF OTHER MEDICATIONS (ICD-V58.69) HEALTH SCREENING (ICD-V70.0) UNSPECIFIED CONSTIPATION (ICD-564.00) DIABETES MELLITUS II, UNCOMPLICATED (ICD-250.00) PERIPHERAL NEUROPATHY, 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 Mother deceased at 62.. cause unkown Father deceased at 33.Marland Kitchenangina Siblings: Sister deceased at 13..Parkinson's, other sister deceased at 23..diabetes   Social History: Reviewed history from 09/29/2008 and no changes required. Married four children Retired .Marland Kitchen1994 Disabled .Marland Kitchen1994 Tobacco Use - Former.  quit 1980 Alcohol Use - no Regular Exercise - no Drug Use - no  Review of Systems       denies weight loss, blurry vision, headache, chest pain, n/v, urinary frequency, cramps, excessive diaphoresis, memory loss, depression, and menopausal sxs.  she is in wheelchair due to doe, rhinorrhea, and easy bruising.   Physical Exam  General:  morbidly obese.  in wheelchair.  no distress Head:  head: no deformity eyes: no periorbital swelling, no proptosis external nose and ears are normal mouth: no lesion seen Neck:  Supple without thyroid enlargement or tenderness.  Lungs:  Clear to auscultation bilaterally. Normal respiratory effort.  Heart:  Regular rate and rhythm without murmurs or gallops noted. Normal S1,S2.   Abdomen:  abdomen is soft, nontender.  no hepatosplenomegaly.   not distended.  no hernia  Msk:  muscle bulk and strength are grossly normal.  no obvious joint swelling.   Pulses:  no carotid bruit.  dp pulses are intact.  Extremities:  (ptr declines to remove compression stockings) no edema Neurologic:  cn 2-12 grossly intact.   readily moves all 4's.   sensation is intact to touch on the feet  Skin:  normal texture and temp.  no rash.  not diaphoretic  Cervical Nodes:  No significant adenopathy.  Psych:  Alert and cooperative; normal mood and affect; normal attention span and concentration.   Additional Exam:  Hemoglobin A1C       [H]  7.6 %        Impression & Recommendations:  Problem # 1:  DIABETES MELLITUS II, UNCOMPLICATED (ICD-250.00)  needs increased  rx  Orders: Est. 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  these limit exercise rx  Medications Added to Medication List This Visit: 1)  Humulin N 100 Unit/ml Susp (Insulin isophane human) .... 50 units at bedtime 2)  Humulin R 100 Unit/ml Soln (Insulin regular human) .... 5 units three times a day (just before each meal)  Patient Instructions: 1)  good diet and exercise habits significanly improve the control of your diabetes.  please let me know if you wish to be referred to a dietician.  high blood sugar is very risky to your health.  you should see an eye doctor every year. 2)  controlling your blood pressure and cholesterol drastically reduces the damage diabetes does to your body.  this also applies to quitting smoking.  please discuss these with your doctor.  you should take an aspirin every day, unless you have been advised by a doctor not to. 3)  check your blood sugar 2 times a day.  vary the time of day when you check, between before the 3 meals, and at bedtime.  also check if you have symptoms of your blood sugar being too high or too low.  please keep a record of the readings and bring it to your next appointment here.  please call us sooner if you are having low blood sugar episodes. 4)  we will need to take this complex situation in stages 5)  start regular insulin 5 units three times a day (just before each meal). 6)  reduce nph to 50 units at bedtime. 7)  Please schedule a follow-up appointment in 2 weeks. Prescriptions: HUMULIN R 100 UNIT/ML SOLN (INSULIN REGULAR HUMAN) 5 units three times a day (just before each meal)  #1 vial x 11   Entered and Authorized by:   Minus Breeding MD   Signed by:   Minus Breeding MD on 01/27/2010   Method used:   Print then Give to Patient   RxID:   [id number]

## 2010-05-29 NOTE — Letter (Signed)
Summary: [facility]   Imported By: Lanelle Bal 07/09/2009 12:44:42  _____________________________________________________________________  External Attachment:    Type:   Image     Comment:   External Document

## 2010-05-29 NOTE — Progress Notes (Signed)
  Phone Note Outgoing Call   Call placed by: Jeremy Johann CMA,  January 09, 2010 10:53 AM Summary of Call: Pt currently taking HUMULIN N 100 UNIT/M 74 units at night. pt aware labs result, labs and coupon mailed, rx sent to pharmacy...............Marland KitchenFelecia Deloach CMA  January 09, 2010 10:54 AM   Follow-up for Phone Call        i discussed pt's insulin routine w/ Dr Everardo All (endocrinology) and at this point i think it would be a good idea for Ms Scholler to see him.  at least for a consultation about how to adjust meds since she's so sensitive to changes.  please ask them if they would be willing to do this and we'll make the referral. Follow-up by: Neena Rhymes MD,  January 13, 2010 10:17 AM  Additional Follow-up for Phone Call Additional follow up Details #1::        Patient will see Dr. Everardo All, she requests the appt be made a couple of weeks out due to her still having issues with her cholesterol med change. Additional Follow-up by: Lucious Groves CMA,  January 13, 2010 11:03 AM    New/Updated Medications: LIPITOR 20 MG TABS (ATORVASTATIN CALCIUM) Take 1 tab once daily at bedtime FENOFIBRATE 160 MG TABS (FENOFIBRATE) Take 1 tab once daily Prescriptions: FENOFIBRATE 160 MG TABS (FENOFIBRATE) Take 1 tab once daily  #30 x 2   Entered by:   Jeremy Johann CMA   Authorized by:   Neena Rhymes MD   Signed by:   Jeremy Johann CMA on 01/09/2010   Method used:   Faxed to ...       [facility] [address] #[id number]* (retail)       [address]       [place], [place]  [address]       Ph: [phone]       Fax: [phone]   RxID:   [id number] LIPITOR 20 MG TABS (ATORVASTATIN CALCIUM) Take 1 tab once daily at bedtime  #30 x 2   Entered by:   Jeremy Johann CMA   Authorized by:   Neena Rhymes MD   Signed by:   Jeremy Johann CMA on 01/09/2010   Method used:   Faxed to ...       [facility] [address] #[id number]* (retail)       [address]       [place], [place]  [address]       Ph: [phone]       Fax: [phone]   RxID:   [id number]

## 2010-05-29 NOTE — Miscellaneous (Signed)
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

## 2010-05-29 NOTE — Progress Notes (Signed)
Summary: refill  Phone Note Refill Request Message from:  Fax from Pharmacy on March 24, 2010 2:26 PM  Refills Requested: Medication #1:  LOSARTAN POTASSIUM-HCTZ 100-12.5 MG TABS one tablet by mouth daily [facility] [address] - fax [phone]  Initial call taken by: Okey Regal Spring,  March 24, 2010 2:26 PM    Prescriptions: LOSARTAN POTASSIUM-HCTZ 100-12.5 MG TABS (LOSARTAN POTASSIUM-HCTZ) one tablet by mouth daily  #30 x 4   Entered by:   Doristine Devoid CMA   Authorized by:   Neena Rhymes MD   Signed by:   Doristine Devoid CMA on 03/24/2010   Method used:   Electronically to        [facility] [address] #[id number]* (retail)       [address]       [place], [place]  [address]       Ph: [phone]       Fax: [phone]   RxID:   [id number]

## 2010-05-29 NOTE — Progress Notes (Signed)
Summary: refill  Phone Note Refill Request Message from:  Fax from Pharmacy on November 27, 2009 3:30 PM  Refills Requested: Medication #1:  ONDANSETRON 8 MG TAKE 1 TAB BY MOUTH TWICE DAILY [facility] [address] [facility]  [id number]  Initial call taken by: Okey Regal Spring,  November 27, 2009 3:39 PM  Follow-up for Phone Call        PATIENT WAS GIVEN ONDANSETRON 8 MG BY ER DOCTOR AT [facility] ON 10/24/2009 FOR NAUSEA  WAS TOLD BY ER DOCTOR TO HAVE HER FAMILY DOCTOR REFILL THIS PRESCRIPTION FOR 30 PILLS  PLEASE CALL THIS IN TO [facility] [address]  Additional Follow-up for Phone Call Additional follow up Details #1::        spoke w/ patient informed this shouldn't be taken regulary says she is still having problems w/ nausea informed patient that she needs to come in to recheck urine to be thtis isn't cause of nausea and evaluate further if needed appt scheudled for tomorrow .........Marland KitchenDoristine Devoid CMA  November 28, 2009 3:58 PM

## 2010-05-29 NOTE — Progress Notes (Signed)
Summary: refill--humulin n  Phone Note From Pharmacy   Caller: [facility] [address] #[id number]* Call For: Connie Hilgert  Summary of Call: Received fax refill request for Humlin N insulin. Inject 60 units every evening. Next appt--01/02/10 with Dr. Beverely Low.  Nicki Guadalajara Fergerson CMA  October 23, 2009 1:05 PM     Prescriptions: HUMULIN N 100 UNIT/ML  SUSP (INSULIN ISOPHANE HUMAN) 60 units daily in the evening  #2 x 3   Entered by:   Mervin Kung CMA   Authorized by:   Neena Rhymes MD   Signed by:   Mervin Kung CMA on 10/23/2009   Method used:   Electronically to        [facility] [address] #[id number]* (retail)       [address]       [place], [place]  [address]       Ph: [phone]       Fax: [phone]   RxID:   [id number]

## 2010-05-29 NOTE — Assessment & Plan Note (Signed)
Summary: rov  Medications Added ICAPS AREDS FORMULA  TABS (MULTIPLE VITAMINS-MINERALS) 1 tab two times a day BUMETANIDE 1 MG TABS (BUMETANIDE) 1 tab once daily        Visit Type:  rov Primary Provider:  Neena Rhymes MD  CC:  sob....edema/ankles/leg...denies any cp.  History of Present Illness: Shelia Woods returns today for evaluation and management of her paroxysmal atrial fibrillation, hypertension, and lower extremity edema.  Her weight is down about 7 pounds since her last visit. Her lower extremity edema is no different. She still wears support hose.  Her medications have been reviewed and are extensive. Clonidine had been increased temporarily but she tolerated it poorly.  Recent laboratory data was reviewed including a lipid panel with the patient. Her lipids are at goal except her LDL is 94 yet her HDL is 58.7. Her total cholesterol ratio ratio is 3. Hemoglobin A1c was suboptimal at 7.2% liver function was normal. Creatinine was elevated at 1.7. Potassium was normal. There was a significant amount of protein in her urine.  She denies any angina. She's had no orthopnea or PND. She denies any palpitations or syncope. She has had some low-grade nausea but no change in her bowel habits.  Current Medications (verified): 1)  Bumex 1 Mg  Tabs (Bumetanide) .... Qd 2)  Hydralazine Hcl 50 Mg  Tabs (Hydralazine Hcl) .... One By Mouth 3 Times A Day 3)  Clonidine Hcl 0.3 Mg  Tabs (Clonidine Hcl) .Marland Kitchen.. 1 By Mouth Bid 4)  Diltiazem Hcl Er Beads 300 Mg Xr24h-Cap (Diltiazem Hcl Er Beads) .... One Tab By Mouth Once Daily 5)  Losartan Potassium-Hctz 100-12.5 Mg Tabs (Losartan Potassium-Hctz) .... One Tablet By Mouth Daily 6)  Humulin N 100 Unit/ml  Susp (Insulin Isophane Human) .... 60 Units Daily in The Evening 7)  Aspirin Childrens 81 Mg Chew (Aspirin) .... Take 2 Tablet By Mouth Once A Day 8)  Tylenol Extra Strength 500 Mg  Tabs (Acetaminophen) .... 2 By Mouth Three Times A Day 9)  Calcium  500 Mg Tabs (Calcium Carbonate) .Marland Kitchen.. 1 Tab Once Daily 10)  Zyrtec Allergy 10 Mg Tabs (Cetirizine Hcl) .Marland Kitchen.. 1 Tab Once Daily 11)  One Touch Ultra Test Strips 12)  [facility] Syringe/ndl 31g 0.28ml 8mm .... As Directed 13)  One Touch Ultra Soft Lancets 14)  Osteo Bi-Flex Regular Strength 250-200 Mg Tabs (Glucosamine-Chondroitin) .... 2  Tab Once Daily 15)  Miralax   Powd (Polyethylene Glycol 3350) .... Prn 16)  Nystatin-Triamcinolone 100000-0.1 Unit/gm-%  Crea (Nystatin-Triamcinolone) .... Apply Sparingly  Twice Daily To Affected Areas After Sitz Bath 17)  Simvastatin 40 Mg Tabs (Simvastatin) .... One Tab By Mouth Once Daily 18)  Icaps Areds Formula  Tabs (Multiple Vitamins-Minerals) .Marland Kitchen.. 1 Tab Two Times A Day 19)  Bumetanide 1 Mg Tabs (Bumetanide) .Marland Kitchen.. 1 Tab Once Daily  Allergies: 1)  ! Penicillin 2)  ! Coumadin 3)  ! 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 cause  unkown Father deceased at 31.Marland Kitchenangina Siblings: Sister deceased at 50..Parkinson's, other sister deceased at 63..diabetes   Social History: Last updated: 2008-10-08 Married four children Retired .Marland Kitchen1994 Disabled .Marland Kitchen1994 Tobacco Use - Former.  quit 1980 Alcohol Use - no Regular Exercise - no Drug Use - no  Risk Factors: Caffeine Use: 0 (11/22/2007) Exercise: no (2008/10/08)  Risk Factors: Smoking Status: quit (Oct 08, 2008) Passive Smoke Exposure: no (11/22/2007)  Review of Systems       negative other than history of present illness  Vital Signs:  Patient profile:   77 year old female Height:      66 inches Weight:      256 pounds BMI:     41.47 Pulse rate:   60 / minute Pulse rhythm:   irregular BP sitting:   148 / 60  (left arm) Cuff size:   large  Vitals Entered By: Danielle Rankin, CMA (August 14, 2009 3:08 PM)  Physical Exam  General:  obese.  obese.   Head:  normocephalic and atraumatic Eyes:  PERRLA/EOM intact; conjunctiva and lids normal. Neck:  Neck supple, no JVD. No masses, thyromegaly or abnormal cervical nodes. Chest Merrilyn Legler:  no deformities or breast masses noted Lungs:  Clear bilaterally to auscultation and percussion. Heart:  Non-displaced PMI, chest non-tender; regular rate and rhythm, S1, S2 without murmurs, rubs or gallops. Carotid upstroke normal, no bruit. Normal abdominal aortic size, no bruits. Femorals normal pulses, no bruits. Pedals normal pulses. No edema, no varicosities. Msk:  decreased ROM.  decreased ROM.   Pulses:  dished but present in the lower extremity Extremities:  1+ left pedal edema and 1+ right pedal edema.  she is wearing support1+ left pedal edema.   Neurologic:  Alert and oriented x 3. Skin:  Intact without lesions or rashes. Psych:  Normal affect.   EKG  Procedure date:  08/14/2009  Findings:      nsinus rhythm with first degree AV block and PACs.  Impression & Recommendations:  Problem # 1:  PAROXYSMAL ATRIAL  FIBRILLATION (ICD-427.31) Assessment Improved  Her updated medication list for this problem includes:    Aspirin Childrens 81 Mg Chew (Aspirin) .Marland Kitchen... Take 2 tablet by mouth once a day  Her updated medication list for this problem includes:    Aspirin Childrens 81 Mg Chew (Aspirin) .Marland Kitchen... Take 2 tablet by mouth once a day  Orders: EKG w/ Interpretation (93000)  Problem # 2:  HYPERCHOLESTEROLEMIA (ICD-272.0)  Her updated medication list for this problem includes:    Simvastatin 40 Mg Tabs (Simvastatin) ..... One tab by mouth once daily  Her updated medication list for this problem includes:    Simvastatin 40 Mg Tabs (Simvastatin) ..... One tab by mouth once daily  Problem # 3:  HYPERTENSION, BENIGN SYSTEMIC (ICD-401.1)  Her updated medication list for this problem includes:    Bumex 1 Mg Tabs (Bumetanide) ..... Qd    Hydralazine Hcl 50 Mg Tabs (Hydralazine hcl) ..... One by mouth 3 times a day    Clonidine Hcl 0.3 Mg Tabs (Clonidine hcl) .Marland Kitchen... 1 by mouth bid    Diltiazem Hcl Er Beads 300 Mg Xr24h-cap (Diltiazem hcl er beads) ..... One tab by mouth once daily    Losartan Potassium-hctz 100-12.5 Mg Tabs (Losartan potassium-hctz) ..... One tablet by mouth daily    Aspirin Childrens 81 Mg Chew (Aspirin) .Marland Kitchen... Take 2 tablet  by mouth once a day    Bumetanide 1 Mg Tabs (Bumetanide) .Marland Kitchen... 1 tab once daily  Her updated medication list for this problem includes:    Bumex 1 Mg Tabs (Bumetanide) ..... Qd    Hydralazine Hcl 50 Mg Tabs (Hydralazine hcl) ..... One by mouth 3 times a day    Clonidine Hcl 0.3 Mg Tabs (Clonidine hcl) .Marland Kitchen... 1 by mouth bid    Diltiazem Hcl Er Beads 300 Mg Xr24h-cap (Diltiazem hcl er beads) ..... One tab by mouth once daily    Losartan Potassium-hctz 100-12.5 Mg Tabs (Losartan potassium-hctz) ..... One tablet by mouth daily    Aspirin Childrens 81 Mg Chew (Aspirin) .Marland Kitchen... Take 2 tablet by mouth once a day    Bumetanide 1 Mg Tabs (Bumetanide) .Marland Kitchen... 1 tab once  daily  Problem # 4:  RENAL INSUFFICIENCY, CHRONIC (ICD-585.9)  Problem # 5:  DIABETES MELLITUS II, UNCOMPLICATED (ICD-250.00) I have reviewed her numbers today. I strongly encouraged her to try another blood sugar control to avoid secondary vascular complications including stroke, heart attack, and progressive renal insufficiency. Her updated medication list for this problem includes:    Losartan Potassium-hctz 100-12.5 Mg Tabs (Losartan potassium-hctz) ..... One tablet by mouth daily    Humulin N 100 Unit/ml Susp (Insulin isophane human) .Marland KitchenMarland KitchenMarland KitchenMarland Kitchen 60 units daily in the evening    Aspirin Childrens 81 Mg Chew (Aspirin) .Marland Kitchen... Take 2 tablet by mouth once a day  Patient Instructions: 1)  Your physician recommends that you schedule a follow-up appointment in: YEAR WITH DR Elanore Talcott 2)  Your physician recommends that you continue on your current medications as directed. Please refer to the Current Medication list given to you today.
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
## 2010-05-29 NOTE — Progress Notes (Signed)
Summary: new script-humulin refill   Phone Note Call from Patient   Caller: Patient Summary of Call: Pt needs a new rx stating that she is taking 74 units not 60 units of her Humulin.Marland Kitchen Please send in new script to [facility] on [address]. Army Fossa CMA  October 25, 2009 11:40 AM     New/Updated Medications: HUMULIN N 100 UNIT/ML  SUSP (INSULIN ISOPHANE HUMAN) take as directed Prescriptions: HUMULIN N 100 UNIT/ML  SUSP (INSULIN ISOPHANE HUMAN) take as directed  #1 month x 3   Entered by:   Doristine Devoid   Authorized by:   Neena Rhymes MD   Signed by:   Doristine Devoid on 10/25/2009   Method used:   Electronically to        [facility] [address] #[id number]* (retail)       [address]       [place], [place]  [address]       Ph: [phone]       Fax: [phone]   RxID:   [id number]

## 2010-05-29 NOTE — Progress Notes (Signed)
Summary: CBG  Phone Note Call from Patient Call back at Home Phone [phone]   Caller: Patient Summary of Call: Pt called stating that she started new Insulin regimen last night. Pt reports CGBs at bedtime last night 247 and 207 this morning. Pt is concerned that medication is not working, should I advise pt to give new regimen more time or does she need med adj? Please advise. Initial call taken by: Margaret Pyle, CMA,  February 06, 2010 9:59 AM  Follow-up for Phone Call        please give it at least a few more days. Follow-up by: Minus Breeding MD,  February 06, 2010 11:10 AM  Additional Follow-up for Phone Call Additional follow up Details #1::        Pt advised Additional Follow-up by: Margaret Pyle, CMA,  February 06, 2010 11:15 AM

## 2010-05-29 NOTE — Progress Notes (Signed)
Summary: refill  Phone Note Refill Request Message from:  Fax from Pharmacy on March 07, 2010 9:37 AM  Refills Requested: Medication #1:  DILTIAZEM HCL ER BEADS 300 MG XR24H-CAP one tab by mouth once daily [facility] - fax [phone]  Initial call taken by: Okey Regal [place],  March 07, 2010 9:39 AM    Prescriptions: DILTIAZEM HCL ER BEADS 300 MG XR24H-CAP (DILTIAZEM HCL ER BEADS) one tab by mouth once daily  #30 x 2   Entered by:   Jeremy Johann CMA   Authorized by:   Neena Rhymes MD   Signed by:   Jeremy Johann CMA on 03/07/2010   Method used:   Faxed to ...       [facility] [address] #[id number]* (retail)       [address]       [place], [place]  [address]       Ph: [phone]       Fax: [phone]   RxID:   [id number]

## 2010-05-29 NOTE — Progress Notes (Signed)
Summary: refill--Hydralazine, needs clarification  Phone Note Refill Request Message from:  Patient  Refills Requested: Medication #1:  HYDRALAZINE HCL 50 MG  TABS one by mouth 2 times a day Pts husband states she is taking three times a day not two times a day. Pharm- [facility] [address]   Follow-up for Phone Call        Called pt, she states she has been taking Hydralazine three times a day and is not sure when it changed to twice a day. Please advise how pt should be taking medication. Nicki Guadalajara Fergerson CMA Duncan Dull)  December 05, 2009 1:42 PM   Additional Follow-up for Phone Call Additional follow up Details #1::        she can take this three times a day- we backed it down to two times a day when she was dizzy and having low blood pressure.  given her recent BPs she can take it three times a day. Additional Follow-up by: Neena Rhymes MD,  December 05, 2009 1:50 PM    Additional Follow-up for Phone Call Additional follow up Details #2::    Pt advised. Rx sent to pharmacy. Nicki Guadalajara Fergerson CMA (AAMA)  December 05, 2009 3:04 PM   New/Updated Medications: HYDRALAZINE HCL 50 MG TABS (HYDRALAZINE HCL) Take 1 tablet by mouth three times a day. Prescriptions: HYDRALAZINE HCL 50 MG TABS (HYDRALAZINE HCL) Take 1 tablet by mouth three times a day.  #90 x 1   Entered by:   Mervin Kung CMA (AAMA)   Authorized by:   Neena Rhymes MD   Signed by:   Mervin Kung CMA (AAMA) on 12/05/2009   Method used:   Electronically to        [id number]* (retail)       [address]       [place], [place]  [address]       Ph: [phone]       Fax: [phone]   RxID:   [id number]

## 2010-05-29 NOTE — Assessment & Plan Note (Signed)
Summary: REOCCURRING UTI---FINISHED CIPRO ON 7TH///SPH   Vital Signs:  Patient profile:   77 year old female Temp:     97.8 degrees F oral BP sitting:   180 / 60  (left arm) Cuff size:   regular  Vitals Entered By: Kathrynn Speed CMA (November 11, 2009 2:07 PM) CC: Reoccurring UTI, finished cipro on the 7, pain in left side, swelling in feet, src   History of Present Illness: 76 yo woman here today for  1) ? UTI- finished cipro on 7/7.  pt reports she felt sxs return at 4am this AM- had sharp pain on LLQ.  pain still present but 'not nearly as bad', now described as a burning pain.  Marland Kitchen  no pain on urination.  + chills.  frequent BMs- had similar w/ last infxn.  denies CVA tenderness.  no fever.  no increased frequency to urination.    2) edema- stopped 1 of the Bumex pills while on abx- legs swelled.  still some swelling but less than before.  3) HTN- BP is elevated but pt not feeling well.  no CP, SOB above baseline.  Current Medications (verified): 1)  Bumex 1 Mg  Tabs (Bumetanide) .Marland Kitchen.. 1 Tab By Mouth Two Times A Day. 2)  Hydralazine Hcl 50 Mg  Tabs (Hydralazine Hcl) .... One By Mouth 2 Times A Day 3)  Clonidine Hcl 0.3 Mg  Tabs (Clonidine Hcl) .Marland Kitchen.. 1 By Mouth Bid 4)  Diltiazem Hcl Er Beads 300 Mg Xr24h-Cap (Diltiazem Hcl Er Beads) .... One Tab By Mouth Once Daily 5)  Losartan Potassium-Hctz 100-12.5 Mg Tabs (Losartan Potassium-Hctz) .... One Tablet By Mouth Daily 6)  Humulin N 100 Unit/ml  Susp (Insulin Isophane Human) .... Take As Directed 7)  Aspirin Childrens 81 Mg Chew (Aspirin) .... Take 2 Tablet By Mouth Once A Day 8)  Tylenol Extra Strength 500 Mg  Tabs (Acetaminophen) .... 2 By Mouth Three Times A Day 9)  Calcium 500 Mg Tabs (Calcium Carbonate) .Marland Kitchen.. 1 Tab Once Daily 10)  Zyrtec Allergy 10 Mg Tabs (Cetirizine Hcl) .Marland Kitchen.. 1 Tab Once Daily 11)  One Touch Ultra Test Strips 12)  [facility] Syringe/ndl 31g 0.70ml 8mm .... As Directed 13)  One Touch Ultra Soft Lancets 14)  Osteo  Bi-Flex Regular Strength 250-200 Mg Tabs (Glucosamine-Chondroitin) .... 2  Tab Once Daily 15)  Miralax   Powd (Polyethylene Glycol 3350) .... Prn 16)  Nystatin-Triamcinolone 100000-0.1 Unit/gm-%  Crea (Nystatin-Triamcinolone) .... Apply Sparingly  Twice Daily To Affected Areas After Sitz Bath 17)  Simvastatin 40 Mg Tabs (Simvastatin) .... One Tab By Mouth Once Daily 18)  Icaps Areds Formula  Tabs (Multiple Vitamins-Minerals) .Marland Kitchen.. 1 Tab Two Times A Day 19)  Bumetanide 1 Mg Tabs (Bumetanide) .Marland Kitchen.. 1 Tab Once Daily  Allergies: 1)  ! Penicillin 2)  ! Coumadin 3)  ! Beta Blockers 4)  ! Keflex (Cephalexin)  Past History:  Past Medical History: Last updated: 11/05/2008 PAROXYSMAL ATRIAL FIBRILLATION (ICD-427.31) HYPERCHOLESTEROLEMIA (ICD-272.0) HYPERTENSION, BENIGN SYSTEMIC (ICD-401.1) FATIGUE (ICD-780.79) HYPERKALEMIA (ICD-276.7) RENAL FAILURE (ICD-586) RENAL INSUFFICIENCY, CHRONIC (ICD-585.9) RHINITIS (ICD-477.9) PROTEINURIA (ICD-791.0) DERMATOPHYTOSIS OF THE BODY (ICD-110.5) ENCOUNTER FOR LONG-TERM USE OF OTHER MEDICATIONS (ICD-V58.69) HEALTH SCREENING (ICD-V70.0) UNSPECIFIED CONSTIPATION (ICD-564.00) DIABETES MELLITUS II, UNCOMPLICATED (ICD-250.00) PERIPHERAL NEUROPATHY, 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 Lungs are clear to auscultation, no crackles or wheezes. Heart:  Normal rate and regular rhythm. S1 and S2 normal  Abdomen:  soft, NT/ND, +BS.  no rebound or guarding.  no CVA tenderness Pulses:  +2 carotid, radial, DP Extremities:  +1 left pedal  edema and right pedal edema.    Impression & Recommendations:  Problem # 1:  UTI (ICD-599.0) Assessment New pt's urine suspicious for infxn.  start Cipro and send for cx.  abd pain not consistent w/ small bowel obstruction or diverticulitis as it is improved already.  will follow. Her updated medication list for this problem includes:    Ciprofloxacin Hcl 500 Mg Tabs (Ciprofloxacin hcl) .Marland Kitchen... Take 1 tab by mouth two times a day x7 days  Orders: T-Culture, Urine ([id number]) Prescription Created Electronically [phone])  Problem # 2:  EDEMA- LOCALIZED (ICD-782.3) Assessment: Unchanged check labs.  encouraged pt to continue Bumex and elevate legs.  pt very sedentary and w/ the heat fluid is pooling in her legs. Her updated medication list for this problem includes:    Bumex 1 Mg Tabs (Bumetanide) .Marland Kitchen... 1 tab by mouth two times a day.    Losartan Potassium-hctz 100-12.5 Mg Tabs (Losartan potassium-hctz) ..... One tablet by mouth daily    Bumetanide 1 Mg Tabs (Bumetanide) .Marland Kitchen... 1 tab once daily  Orders: Venipuncture (52841) TLB-BMP (Basic Metabolic Panel-BMET) (80048-METABOL) TLB-BNP (B-Natriuretic Peptide) (83880-BNPR)  Problem # 3:  HYPERTENSION, BENIGN SYSTEMIC (ICD-401.1) Assessment: Unchanged BP not well controlled today but pt not feeling well and anxious.  will not adjust BP meds at this time but pt and husband instructed to monitor this and report back if persistantly elevated. Her updated medication list for this problem includes:    Bumex 1 Mg Tabs (Bumetanide) .Marland Kitchen... 1 tab by mouth two times a day.    Hydralazine Hcl 50 Mg Tabs (Hydralazine hcl) ..... One by mouth 2 times a day    Clonidine Hcl 0.3 Mg Tabs (Clonidine hcl) .Marland Kitchen... 1 by mouth bid    Diltiazem Hcl Er Beads 300 Mg Xr24h-cap (Diltiazem hcl er beads) ..... One tab by mouth once daily    Losartan Potassium-hctz 100-12.5 Mg Tabs (Losartan potassium-hctz) ..... One tablet by mouth daily    Bumetanide 1 Mg Tabs  (Bumetanide) .Marland Kitchen... 1 tab once daily  Complete Medication List: 1)  Bumex 1 Mg Tabs (Bumetanide) .Marland Kitchen.. 1 tab by mouth two times a day. 2)  Hydralazine Hcl 50 Mg Tabs (Hydralazine hcl) .... One by mouth 2 times a day 3)  Clonidine Hcl 0.3 Mg Tabs (Clonidine hcl) .Marland Kitchen.. 1 by mouth bid 4)  Diltiazem Hcl Er Beads 300 Mg Xr24h-cap (Diltiazem hcl er beads) .... One tab by mouth once daily 5)  Losartan Potassium-hctz 100-12.5 Mg Tabs (Losartan potassium-hctz) .... One tablet by mouth daily 6)  Humulin N 100 Unit/ml Susp (Insulin isophane human) .... Take as directed 7)  Aspirin Childrens 81 Mg Chew (Aspirin) .... Take 2 tablet by mouth once a day 8)  Tylenol Extra Strength 500 Mg Tabs (Acetaminophen) .... 2 by mouth three times a day 9)  Calcium 500 Mg Tabs (Calcium carbonate) .Marland Kitchen.. 1 tab once daily 10)  Zyrtec Allergy 10 Mg Tabs (Cetirizine hcl) .Marland Kitchen.. 1 tab once daily 11)  One Touch Ultra Test Strips  12)  [facility] Syringe/ndl 31g 0.22ml 8mm  .... As directed 13)  One Touch Ultra Soft Lancets  14)  Osteo Bi-flex Regular Strength 250-200 Mg Tabs (Glucosamine-chondroitin) .... 2  tab once daily 15)  Miralax Powd (Polyethylene glycol 3350) .... Prn 16)  Nystatin-triamcinolone 100000-0.1 Unit/gm-% Crea (Nystatin-triamcinolone) .... Apply sparingly  twice daily to affected areas after sitz bath 17)  Simvastatin 40 Mg Tabs (Simvastatin) .... One tab by mouth once daily 18)  Icaps Areds Formula Tabs (Multiple vitamins-minerals) .Marland Kitchen.. 1 tab two times a day 19)  Bumetanide 1 Mg Tabs (Bumetanide) .Marland Kitchen.. 1 tab once daily 20)  Ciprofloxacin Hcl 500 Mg Tabs (Ciprofloxacin hcl) .... Take 1 tab by mouth two times a day x7 days  Other Orders: UA Dipstick w/o Micro (manual) ([address])  Patient Instructions: 1)  Follow up in 1 week to recheck BP 2)  Start the cipro as directed- take w/ food to avoid upset stomach.  If your urine culture requires [place] to change this medicine, we'll call you. 3)  Stop your calcium while on  the Cipro 4)  Continue your Bumex 5)  We'll notify you of your lab results 6)  Keep your legs elevated as much as possible 7)  If your symptoms change or worsen- please call or go to the ER 8)  Hang in there! Prescriptions: CIPROFLOXACIN HCL 500 MG TABS (CIPROFLOXACIN HCL) take 1 tab by mouth two times a day x7 days  #14 x 0   Entered and Authorized by:   Neena Rhymes MD   Signed by:   Neena Rhymes MD on 11/11/2009   Method used:   Electronically to        [facility] [address] [phone]* (retail)       [address]       [place], [place]  [address]       Ph: [phone]       Fax: [phone]   RxID:   [id number]   Laboratory Results   Urine Tests   Date/Time Reported: November 11, 2009   Routine Urinalysis   Color: yellow Appearance: Clear Glucose: negative   (Normal Range: Negative) Bilirubin: negative   (Normal Range: Negative) Ketone: negative   (Normal Range: Negative) Spec. Gravity: >=1.030   (Normal Range: 1.003-1.035) Blood: trace-intact   (Normal Range: Negative) pH: 6.0   (Normal Range: 5.0-8.0) Protein: >=300   (Normal Range: Negative) Urobilinogen: 0.2   (Normal Range: 0-1) Nitrite: negative   (Normal Range: Negative) Leukocyte Esterace: negative   (Normal Range: Negative)    Comments: Kathrynn Speed CMA  November 11, 2009 2:13 PM

## 2010-05-29 NOTE — Progress Notes (Signed)
Summary: Low BS episode  Phone Note Outgoing Call   Summary of Call: Spoke to pt. re: low blood sugar episode this a.m.  BS 267 at 10pm yesterday. Pt took 46 units of NPH 10pm and BS was 68 at 5am today.  Initial call taken by: Mervin Kung CMA,  June 21, 2009 8:32 AM  Follow-up for Phone Call        Please advise patient to take AM NPH 20 units Subcutaneously before breakfast, and to decrease PM dose to 40 units please.  If her fasting sugar is low with this regimen, then she should decrease PM dose further to 35 units.   Follow-up by: Lemont Fillers FNP,  June 21, 2009 8:43 AM  Additional Follow-up for Phone Call Additional follow up Details #1::        Pt made aware of insulin dose change. Advised pt. to take NPH around supper instead of 10pm at night per Kazmir Oki. Pt will call if she has further low BS episodes. Additional Follow-up by: Mervin Kung CMA,  June 21, 2009 10:00 AM

## 2010-05-29 NOTE — Progress Notes (Signed)
Summary: Refill Request  Phone Note Refill Request Call back at [phone] Message from:  Pharmacy on October 25, 2009 4:48 PM  Refills Requested: Medication #1:  HUMULIN N 100 UNIT/ML  SUSP take as directed   Dosage confirmed as above?Dosage Confirmed   Supply Requested: 1 month Patient is stating that she uses up to 74 unites of insulin daily. She is requesting a prescription for larger qty to last her a ful month and with updated directions so we can bill insurance. Rx you just sent was for 1 vial. Please clarify. Thank you!  Initial call taken by: Harold Barban,  October 25, 2009 4:50 PM

## 2010-05-29 NOTE — Medication Information (Signed)
Summary: Fax Regarding Authorization for [facility]  Fax Regarding Authorization for [facility]   Imported By: Lanelle Bal 07/10/2009 13:07:14  _____________________________________________________________________  External Attachment:    Type:   Image     Comment:   External Document

## 2010-05-29 NOTE — Letter (Signed)
Summary: [facility]- no retinopathy  [facility]   Imported By: Lanelle Bal 01/09/2010 09:53:42  _____________________________________________________________________  External Attachment:    Type:   Image     Comment:   External Document

## 2010-05-29 NOTE — Assessment & Plan Note (Signed)
Summary: 3 WK FU---STC   Vital Signs:  Patient profile:   77 year old female Height:      66 inches (167.64 cm) Weight:      257.50 pounds (117.05 kg) BMI:     41.71 O2 Sat:      94 % on Room air Temp:     97.9 degrees F (36.61 degrees C) oral Pulse rate:   70 / minute BP sitting:   134 / 70  (left arm) Cuff size:   large  Vitals Entered By: Brenton Grills CMA Duncan Dull) (March 03, 2010 4:13 PM)  O2 Flow:  Room air CC: 3 week F/U/aj Is Patient Diabetic? Yes   Primary Umberto Pavek:  Neena Rhymes MD  CC:  3 week F/U/aj.  History of Present Illness: pt states she feels well in general.  she brings a record of her cbg's which i have reviewed today.  it varies from 120 (lunch) to 200 (supper and hs).  pt and husband say biggest meal is at lunch.  Current Medications (verified): 1)  Bumex 1 Mg  Tabs (Bumetanide) .Marland Kitchen.. 1 Tab By Mouth Two Times A Day. 2)  Hydralazine Hcl 50 Mg Tabs (Hydralazine Hcl) .... Take 1 Tablet By Mouth Three Times A Day. 3)  Clonidine Hcl 0.3 Mg  Tabs (Clonidine Hcl) .Marland Kitchen.. 1 By Mouth Bid 4)  Diltiazem Hcl Er Beads 300 Mg Xr24h-Cap (Diltiazem Hcl Er Beads) .... One Tab By Mouth Once Daily 5)  Losartan Potassium-Hctz 100-12.5 Mg Tabs (Losartan Potassium-Hctz) .... One Tablet By Mouth Daily 6)  Humulin N 100 Unit/ml  Susp (Insulin Isophane Human) .... 30 Units At Bedtime 7)  Aspirin Childrens 81 Mg Chew (Aspirin) .... Take 2 Tablet By Mouth Once A Day 8)  Tylenol Extra Strength 500 Mg  Tabs (Acetaminophen) .... 2 By Mouth Three Times A Day 9)  Calcium 500 Mg Tabs (Calcium Carbonate) .Marland Kitchen.. 1 Tab Once Daily 10)  Zyrtec Allergy 10 Mg Tabs (Cetirizine Hcl) .Marland Kitchen.. 1 Tab Once Daily 11)  One Touch Ultra Test Strips 12)  One Touch Ultra Soft Lancets 13)  Osteo Bi-Flex Regular Strength 250-200 Mg Tabs (Glucosamine-Chondroitin) .... 2  Tab Once Daily 14)  Miralax   Powd (Polyethylene Glycol 3350) .... Prn 15)  Nystatin-Triamcinolone 100000-0.1 Unit/gm-%  Crea  (Nystatin-Triamcinolone) .... Apply Sparingly  Twice Daily To Affected Areas 16)  Lipitor 20 Mg Tabs (Atorvastatin Calcium) .... Take 1 Tab Once Daily At Bedtime 17)  Icaps Areds Formula  Tabs (Multiple Vitamins-Minerals) .Marland Kitchen.. 1 Tab Two Times A Day 18)  Ondansetron 8 Mg Tbdp (Ondansetron) .Marland Kitchen.. 1 Tab By Mouth Q8 As Needed For Nausea 19)  Fenofibrate 160 Mg Tabs (Fenofibrate) .... Take 1 Tab Once Daily 20)  Humulin R 100 Unit/ml Soln (Insulin Regular Human) .... Three Times A Day (Just Before Each Meal) 02-03-29 Units 21)  Thinpro Insulin Syringe 31g X 3/8" 0.3 Ml Misc (Insulin Syringe-Needle U-100) .... Any Brand, 4x A Day  Allergies (verified): 1)  ! Penicillin 2)  ! Coumadin 3)  ! Beta Blockers 4)  ! Keflex (Cephalexin)  Past History:  Past Medical History: Last updated: 11/05/2008 PAROXYSMAL ATRIAL FIBRILLATION (ICD-427.31) HYPERCHOLESTEROLEMIA (ICD-272.0) HYPERTENSION, BENIGN SYSTEMIC (ICD-401.1) FATIGUE (ICD-780.79) HYPERKALEMIA (ICD-276.7) RENAL FAILURE (ICD-586) RENAL INSUFFICIENCY, CHRONIC (ICD-585.9) RHINITIS (ICD-477.9) PROTEINURIA (ICD-791.0) DERMATOPHYTOSIS OF THE BODY (ICD-110.5) ENCOUNTER FOR LONG-TERM USE OF OTHER MEDICATIONS (ICD-V58.69) HEALTH SCREENING (ICD-V70.0) UNSPECIFIED CONSTIPATION (ICD-564.00) DIABETES MELLITUS II, UNCOMPLICATED (ICD-250.00) PERIPHERAL NEUROPATHY, 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    Physical Exam  General:  normal appearance.   Psych:  Alert and cooperative; normal mood and affect; normal attention span and concentration.     Impression & Recommendations:  Problem # 1:  DIABETES MELLITUS II, UNCOMPLICATED (ICD-250.00) needs increased rx  Medications Added to Medication  List This Visit: 1)  Humulin R 100 Unit/ml Soln (Insulin regular human) .... Three times a day (just before each meal) 02-08-34 units  Other Orders: Est. Patient Level III (11914)  Patient Instructions: 1)  check your blood sugar 2 times a day.  vary the time of day when you check, between before the 3 meals, and at bedtime.  also check if you have symptoms of your blood sugar being too high or too low.  please keep a record of the readings and bring it to your next appointment here.  please call us sooner if you are having low blood sugar episodes. 2)  we will need to take this complex situation in stages 3)  increase regular insulin to three times a day (just before each meal), 02-08-34 units. 4)  continue nph insulin, 30 units at bedtime. 5)  Please schedule a follow-up appointment in 1 month.   Orders Added: 1)  Est. Patient Level III [78295]

## 2010-05-29 NOTE — Progress Notes (Signed)
Summary: Margurite Auerbach.  Phone Note Outgoing Call   Summary of Call: Received paper from [facility] that Case # [id number] for [facility] auth.  will close on 08/18/09.  Pt must try alternative first. Pt. has stated in 2/25 that she was not ready to try a new med at that time because of blood sugar problems the pt. was having. She wants to get bs under control before trying another new med. Initial call taken by: Mervin Kung CMA,  July 05, 2009 10:55 AM

## 2010-06-04 NOTE — Progress Notes (Signed)
Summary: refill  Phone Note Refill Request Message from:  Fax from Pharmacy on May 29, 2010 10:31 AM  Refills Requested: Medication #1:  DILTIAZEM HCL ER BEADS 300 MG XR24H-CAP one tab by mouth once daily [facility] - fax [phone] --- phone [phone]  Initial call taken by: Okey Regal [place],  May 29, 2010 10:32 AM    Prescriptions: DILTIAZEM HCL ER BEADS 300 MG XR24H-CAP (DILTIAZEM HCL ER BEADS) one tab by mouth once daily  #30 x 6   Entered by:   Doristine Devoid CMA   Authorized by:   Neena Rhymes MD   Signed by:   Doristine Devoid CMA on 05/29/2010   Method used:   Electronically to        [facility] [id number]* (retail)       [address]       [place], [place]  [address]       Ph: [phone]       Fax: [phone]   RxID:   [id number]

## 2010-07-03 ENCOUNTER — Ambulatory Visit: Payer: Self-pay | Admitting: Family Medicine

## 2010-07-10 ENCOUNTER — Telehealth: Payer: Self-pay | Admitting: Family Medicine

## 2010-07-10 ENCOUNTER — Ambulatory Visit (INDEPENDENT_AMBULATORY_CARE_PROVIDER_SITE_OTHER): Payer: Medicare Other | Admitting: Family Medicine

## 2010-07-10 ENCOUNTER — Encounter: Payer: Self-pay | Admitting: Family Medicine

## 2010-07-10 ENCOUNTER — Other Ambulatory Visit: Payer: Self-pay | Admitting: Family Medicine

## 2010-07-10 DIAGNOSIS — E785 Hyperlipidemia, unspecified: Secondary | ICD-10-CM

## 2010-07-10 DIAGNOSIS — I10 Essential (primary) hypertension: Secondary | ICD-10-CM

## 2010-07-10 DIAGNOSIS — E7800 Pure hypercholesterolemia, unspecified: Secondary | ICD-10-CM

## 2010-07-10 DIAGNOSIS — R42 Dizziness and giddiness: Secondary | ICD-10-CM

## 2010-07-10 LAB — HEPATIC FUNCTION PANEL
AST: 19 U/L (ref 0–37)
Alkaline Phosphatase: 40 U/L (ref 39–117)
Bilirubin, Direct: 0.1 mg/dL (ref 0.0–0.3)
Total Protein: 6.9 g/dL (ref 6.0–8.3)

## 2010-07-10 LAB — BASIC METABOLIC PANEL
BUN: 39 mg/dL — ABNORMAL HIGH (ref 6–23)
CO2: 28 mEq/L (ref 19–32)
Calcium: 10.2 mg/dL (ref 8.4–10.5)
Chloride: 102 mEq/L (ref 96–112)
Creatinine, Ser: 1.5 mg/dL — ABNORMAL HIGH (ref 0.4–1.2)

## 2010-07-10 LAB — LIPID PANEL
Total CHOL/HDL Ratio: 4
VLDL: 40.2 mg/dL — ABNORMAL HIGH (ref 0.0–40.0)

## 2010-07-11 ENCOUNTER — Ambulatory Visit: Payer: Self-pay | Admitting: Cardiology

## 2010-07-13 LAB — DIFFERENTIAL
Basophils Relative: 0 % (ref 0–1)
Eosinophils Absolute: 0 10*3/uL (ref 0.0–0.7)
Eosinophils Relative: 0 % (ref 0–5)
Monocytes Absolute: 0.5 10*3/uL (ref 0.1–1.0)
Monocytes Relative: 5 % (ref 3–12)
Neutrophils Relative %: 85 % — ABNORMAL HIGH (ref 43–77)

## 2010-07-13 LAB — URINALYSIS, ROUTINE W REFLEX MICROSCOPIC
Bilirubin Urine: NEGATIVE
Glucose, UA: NEGATIVE mg/dL
Ketones, ur: 15 mg/dL — AB
Protein, ur: >300 mg/dL — AB
Urobilinogen, UA: 0.2 mg/dL (ref 0.0–1.0)

## 2010-07-13 LAB — COMPREHENSIVE METABOLIC PANEL
ALT: 23 U/L (ref 0–35)
AST: 20 U/L (ref 0–37)
Albumin: 3.8 g/dL (ref 3.5–5.2)
Alkaline Phosphatase: 61 U/L (ref 39–117)
GFR calc Af Amer: >60 mL/min (ref >60)
Glucose, Bld: 203 mg/dL — ABNORMAL HIGH (ref 70–99)
Potassium: 3.7 mEq/L (ref 3.5–5.1)
Sodium: 139 mEq/L (ref 135–145)
Total Protein: 7.5 g/dL (ref 6.0–8.3)

## 2010-07-13 LAB — URINE CULTURE
Colony Count: NO GROWTH
Culture: NO GROWTH

## 2010-07-13 LAB — URINE MICROSCOPIC-ADD ON

## 2010-07-13 LAB — GLUCOSE, CAPILLARY: Glucose-Capillary: 210 mg/dL — ABNORMAL HIGH (ref 70–99)

## 2010-07-13 LAB — CBC
MCV: 88.6 fL (ref 78.0–100.0)
RBC: 4.41 MIL/uL (ref 3.87–5.11)
RDW: 14.6 % (ref 11.5–15.5)

## 2010-07-13 LAB — POCT CARDIAC MARKERS
CKMB, poc: <1 ng/mL — ABNORMAL LOW (ref 1.0–8.0)
Myoglobin, poc: 88.2 ng/mL (ref 12–200)

## 2010-07-14 ENCOUNTER — Ambulatory Visit: Payer: Self-pay | Admitting: Endocrinology

## 2010-07-15 NOTE — Assessment & Plan Note (Signed)
Summary: rto 3 mo/cbs   Vital Signs:  Patient profile:   77 year old female Height:      66 inches (167.64 cm) Temp:     97.3 degrees F (36.28 degrees C) oral BP sitting:   160 / 70  (left arm) Cuff size:   regular  Vitals Entered By: Lucious Groves CMA (July 10, 2010 9:45 AM) CC: 3 mo rtn ov/fasting./kb Is Patient Diabetic? Yes Pain Assessment Patient in pain? no      Comments Patient denies having any concerns.   History of Present Illness: 77 yo woman here today for f/u on  1) hyperlipidemia- doing well on fenofibrate and lipitor.  no abd pain, N/V, myalgias  2) HTN- has not taken BP meds today.  no CP, SOB, edema, HAs, visual changes.  Current Medications (verified): 1)  Bumex 1 Mg  Tabs (Bumetanide) .Marland Kitchen.. 1 Tab By Mouth Two Times A Day. 2)  Hydralazine Hcl 50 Mg Tabs (Hydralazine Hcl) .... Take 1 Tablet By Mouth Three Times A Day. 3)  Clonidine Hcl 0.3 Mg  Tabs (Clonidine Hcl) .Marland Kitchen.. 1 By Mouth Bid 4)  Taztia Xt 300 Mg Xr24h-Cap (Diltiazem Hcl Er Beads) .... I By Mouth Once Daily 5)  Losartan Potassium-Hctz 100-12.5 Mg Tabs (Losartan Potassium-Hctz) .... One Tablet By Mouth Daily 6)  Humulin N 100 Unit/ml  Susp (Insulin Isophane Human) .... 30 Units At Bedtime 7)  Aspirin Childrens 81 Mg Chew (Aspirin) .... Take 2 Tablet By Mouth Once A Day 8)  Tylenol Extra Strength 500 Mg  Tabs (Acetaminophen) .... 2 By Mouth Three Times A Day 9)  Calcium 500 Mg Tabs (Calcium Carbonate) .Marland Kitchen.. 1 Tab Once Daily 10)  Zyrtec Allergy 10 Mg Tabs (Cetirizine Hcl) .Marland Kitchen.. 1 Tab Once Daily 11)  One Touch Ultra Test Strips 12)  One Touch Ultra Soft Lancets 13)  Osteo Bi-Flex Regular Strength 250-200 Mg Tabs (Glucosamine-Chondroitin) .... 2  Tab Once Daily 14)  Miralax   Powd (Polyethylene Glycol 3350) .... Prn 15)  Nystatin-Triamcinolone 100000-0.1 Unit/gm-%  Crea (Nystatin-Triamcinolone) .... Apply Sparingly  Twice Daily To Affected Areas 16)  Lipitor 20 Mg Tabs (Atorvastatin Calcium) .... Take 1  Tab Once Daily At Bedtime 17)  Icaps Areds Formula  Tabs (Multiple Vitamins-Minerals) .Marland Kitchen.. 1 Tab Two Times A Day 18)  Ondansetron 8 Mg Tbdp (Ondansetron) .Marland Kitchen.. 1 Tab By Mouth Q8 As Needed For Nausea 19)  Fenofibrate 160 Mg Tabs (Fenofibrate) .... Take 1 Tab Once Daily 20)  Humulin R 100 Unit/ml Soln (Insulin Regular Human) .... Three Times A Day (Just Before Each Meal) 02-08-29 Units 21)  Thinpro Insulin Syringe 31g X 3/8" 0.3 Ml Misc (Insulin Syringe-Needle U-100) .... Any Brand, 4x A Day 22)  Benadryl 25 Mg Tabs (Diphenhydramine Hcl) .... Take One Tablet Daily 23)  Cyclobenzaprine Hcl 10 Mg  Tabs (Cyclobenzaprine Hcl) .Marland Kitchen.. 1 By Mouth 2 Times Daily As Needed For Back Pain.  Will Cause Drowsiness  Allergies (verified): 1)  ! Penicillin 2)  ! Coumadin 3)  ! Beta Blockers 4)  ! Keflex (Cephalexin)  Review of Systems      See HPI  Physical Exam  General:  Well-developed,well-nourished,in no acute distress; alert,appropriate and cooperative throughout examination.  sitting in wheel chair Neck:  No deformities, masses, or tenderness noted. Lungs:  Normal respiratory effort, chest expands symmetrically. Lungs are clear to auscultation, no crackles or wheezes. Heart:  Normal rate and regular rhythm. S1 and S2 normal, II/VI SEM Abdomen:  soft, NT/ND, +BS Pulses:  +  2 carotid, radial, PT Extremities:  trace pedal edema bilaterally   Impression & Recommendations:  Problem # 1:  HYPERTENSION, BENIGN SYSTEMIC (ICD-401.1) Assessment Unchanged BP elevated today but pt hasn't taken medicine.  asymptomatic.  no changes. Her updated medication list for this problem includes:    Bumex 1 Mg Tabs (Bumetanide) .Marland Kitchen... 1 tab by mouth two times a day.    Hydralazine Hcl 50 Mg Tabs (Hydralazine hcl) .Marland Kitchen... Take 1 tablet by mouth three times a day.    Clonidine Hcl 0.3 Mg Tabs (Clonidine hcl) .Marland Kitchen... 1 by mouth bid    Taztia Xt 300 Mg Xr24h-cap (Diltiazem hcl er beads) ..... I by mouth once daily    Losartan  Potassium-hctz 100-12.5 Mg Tabs (Losartan potassium-hctz) ..... One tablet by mouth daily  Orders: TLB-BMP (Basic Metabolic Panel-BMET) (80048-METABOL) TLB-TSH (Thyroid Stimulating Hormone) (84443-TSH) Specimen Handling (16109)  Problem # 2:  HYPERCHOLESTEROLEMIA (ICD-272.0) Assessment: Unchanged check fasting labs.  adjust meds as needed. Her updated medication list for this problem includes:    Lipitor 20 Mg Tabs (Atorvastatin calcium) .Marland Kitchen... Take 1 tab once daily at bedtime    Fenofibrate 160 Mg Tabs (Fenofibrate) .Marland Kitchen... Take 1 tab once daily  Orders: Venipuncture (60454) TLB-Lipid Panel (80061-LIPID) TLB-Hepatic/Liver Function Pnl (80076-HEPATIC) Specimen Handling (09811)  Complete Medication List: 1)  Bumex 1 Mg Tabs (Bumetanide) .Marland Kitchen.. 1 tab by mouth two times a day. 2)  Hydralazine Hcl 50 Mg Tabs (Hydralazine hcl) .... Take 1 tablet by mouth three times a day. 3)  Clonidine Hcl 0.3 Mg Tabs (Clonidine hcl) .Marland Kitchen.. 1 by mouth bid 4)  Taztia Xt 300 Mg Xr24h-cap (Diltiazem hcl er beads) .... I by mouth once daily 5)  Losartan Potassium-hctz 100-12.5 Mg Tabs (Losartan potassium-hctz) .... One tablet by mouth daily 6)  Humulin N 100 Unit/ml Susp (Insulin isophane human) .... 30 units at bedtime 7)  Aspirin Childrens 81 Mg Chew (Aspirin) .... Take 2 tablet by mouth once a day 8)  Tylenol Extra Strength 500 Mg Tabs (Acetaminophen) .... 2 by mouth three times a day 9)  Calcium 500 Mg Tabs (Calcium carbonate) .Marland Kitchen.. 1 tab once daily 10)  Zyrtec Allergy 10 Mg Tabs (Cetirizine hcl) .Marland Kitchen.. 1 tab once daily 11)  One Touch Ultra Test Strips  12)  One Touch Ultra Soft Lancets  13)  Osteo Bi-flex Regular Strength 250-200 Mg Tabs (Glucosamine-chondroitin) .... 2  tab once daily 14)  Miralax Powd (Polyethylene glycol 3350) .... Prn 15)  Nystatin-triamcinolone 100000-0.1 Unit/gm-% Crea (Nystatin-triamcinolone) .... Apply sparingly  twice daily to affected areas 16)  Lipitor 20 Mg Tabs (Atorvastatin  calcium) .... Take 1 tab once daily at bedtime 17)  Icaps Areds Formula Tabs (Multiple vitamins-minerals) .Marland Kitchen.. 1 tab two times a day 18)  Ondansetron 8 Mg Tbdp (Ondansetron) .Marland Kitchen.. 1 tab by mouth q8 as needed for nausea 19)  Fenofibrate 160 Mg Tabs (Fenofibrate) .... Take 1 tab once daily 20)  Humulin R 100 Unit/ml Soln (Insulin regular human) .... Three times a day (just before each meal) 02-08-29 units 21)  Thinpro Insulin Syringe 31g X 3/8" 0.3 Ml Misc (Insulin syringe-needle u-100) .... Any brand, 4x a day 22)  Benadryl 25 Mg Tabs (Diphenhydramine hcl) .... Take one tablet daily 23)  Cyclobenzaprine Hcl 10 Mg Tabs (Cyclobenzaprine hcl) .Marland Kitchen.. 1 by mouth 2 times daily as needed for back pain.  will cause drowsiness  Other Orders: Prescription Created Electronically [phone])  Patient Instructions: 1)  Please schedule a follow-up appointment in 6 months for your  complete physical- do not eat before this appt. 2)  You look great! 3)  We'll notify you of the lab results and adjust meds as needed. 4)  Call with any questions or concerns 5)  Happy Spring!!!  Prescriptions: ONDANSETRON 8 MG TBDP (ONDANSETRON) 1 tab by mouth Q8 as needed for nausea  #20 x 0   Entered and Authorized by:   Neena Rhymes MD   Signed by:   Neena Rhymes MD on 07/10/2010   Method used:   Electronically to        [facility] [address] [phone]* (retail)       [address]       [place], [place]  [address]       Ph: [phone]       Fax: [phone]   RxID:   [id number]    Orders Added: 1)  Venipuncture [44034] 2)  TLB-Lipid Panel [80061-LIPID] 3)  TLB-Hepatic/Liver Function Pnl [80076-HEPATIC] 4)  TLB-BMP (Basic Metabolic Panel-BMET) [80048-METABOL] 5)  TLB-TSH (Thyroid Stimulating Hormone) [84443-TSH] 6)  Specimen Handling [99000] 7)  Est. Patient Level III [74259] 8)  Prescription Created Electronically [phone]

## 2010-07-15 NOTE — Progress Notes (Signed)
Summary: Refill  Phone Note Refill Request Call back at [phone] Message from:  Pharmacy on July 10, 2010 8:01 AM  Refills Requested: Medication #1:  HYDRALAZINE HCL 50 MG TABS Take 1 tablet by mouth three times a day.   Dosage confirmed as above?Dosage Confirmed   Supply Requested: 1 month [facility] [address]  Next Appointment Scheduled: 3.15.12 Initial call taken by: Lavell Islam,  July 10, 2010 8:01 AM    Prescriptions: HYDRALAZINE HCL 50 MG TABS (HYDRALAZINE HCL) Take 1 tablet by mouth three times a day.  #90 x 0   Entered by:   Lucious Groves CMA   Authorized by:   Neena Rhymes MD   Signed by:   Lucious Groves CMA on 07/10/2010   Method used:   Electronically to        [facility] [address] #[id number]* (retail)       [address]       [place], [place]  [address]       Ph: [phone]       Fax: [phone]   RxID:   [id number]

## 2010-07-17 ENCOUNTER — Other Ambulatory Visit: Payer: Self-pay | Admitting: Family Medicine

## 2010-07-17 MED ORDER — CLONIDINE HCL 0.3 MG PO TABS: 0.3000 mg | ORAL_TABLET | Freq: Two times a day (BID) | ORAL | Status: DC

## 2010-07-17 NOTE — Telephone Encounter (Signed)
Pt was last seen 07/10/10. Please advise.

## 2010-07-17 NOTE — Telephone Encounter (Signed)
Pt was last seen 07/10/10. Please advise.   

## 2010-07-27 NOTE — Patient Instructions (Addendum)
check your blood sugar 2 times a day.  vary the time of day when you check, between before the 3 meals, and at bedtime.  also check if you have symptoms of your blood sugar being too high or too low.  please keep a record of the readings and bring it to your next appointment here.  please call us sooner if you are having low blood sugar episodes. blood tests are being ordered for you today.  please call [phone] to hear your test results. pending the test results, please decrease regular insulin to three times a day (just before each meal), 02-09-24 units. continue nph insulin, 30 units at bedtime. Please schedule a follow-up appointment in 4 months.  On that day, do not wear compression stockings, so we can do your foot exam.

## 2010-07-28 ENCOUNTER — Encounter: Payer: Self-pay | Admitting: Endocrinology

## 2010-07-28 ENCOUNTER — Ambulatory Visit (INDEPENDENT_AMBULATORY_CARE_PROVIDER_SITE_OTHER): Payer: Medicare Other | Admitting: Endocrinology

## 2010-07-28 ENCOUNTER — Other Ambulatory Visit (INDEPENDENT_AMBULATORY_CARE_PROVIDER_SITE_OTHER): Payer: Medicare Other

## 2010-07-28 VITALS — BP 134/62 | HR 61 | Temp 97.9°F | Ht 66.0 in | Wt 255.8 lb

## 2010-07-28 DIAGNOSIS — E119 Type 2 diabetes mellitus without complications: Secondary | ICD-10-CM

## 2010-07-28 LAB — HEMOGLOBIN A1C: Hgb A1c MFr Bld: 6.6 % — ABNORMAL HIGH (ref 4.6–6.5)

## 2010-07-28 MED ORDER — "INSULIN SYRINGE-NEEDLE U-100 31G X 3/8"" 0.3 ML MISC": Status: DC

## 2010-07-28 MED ORDER — INSULIN REGULAR HUMAN 100 UNIT/ML IJ SOLN: INTRAMUSCULAR | Status: DC

## 2010-07-28 MED ORDER — GLUCOSE BLOOD VI STRP: ORAL_STRIP | Status: DC

## 2010-07-28 NOTE — Progress Notes (Signed)
  Subjective:    Patient ID: Shelia Woods, female    DOB: Apr 30, 1930, 77 y.o.   MRN: [id number]  HPI pt states she feels well in general.  she brings a record of her cbg's which i have reviewed today.  It is sometimes mildly low (60's) at hs.  It is as los as 100 at all other times, except am, when it is mid-100's.   Past Medical History  Diagnosis Date  . PAROXYSMAL ATRIAL FIBRILLATION 09/29/2008  . HYPERCHOLESTEROLEMIA 06/24/2006  . HYPERTENSION, BENIGN SYSTEMIC 06/24/2006  . FATIGUE 11/22/2007  . RENAL FAILURE 11/22/2007  . RENAL INSUFFICIENCY, CHRONIC 04/10/2008  . Proteinuria 05/17/2008  . Dermatophytosis of the body 11/30/2007  . NEOP, MALIGNANT, SKIN NOS 11/08/2006  . DIABETES MELLITUS II, UNCOMPLICATED 06/24/2006  . SYNDROME, CARPAL TUNNEL 11/08/2006  . PERIPHERAL NEUROPATHY, LOWER EXTREMITY 11/08/2006  . MACULAR DEGENERATION, SENILE, NONEXUDATIVE 01/01/2010  . CATARACTS, SENILE, NUCLEAR, BILATERAL 01/01/2010  . SMALL BOWEL OBSTRUCTION 11/05/2008  . INGROWN TOENAIL, INFECTED 04/10/2008  . OSTEOARTHRITIS, LOWER LEG 06/24/2006  . MUSCLE SPASM, TRAPEZIUS 04/02/2010  . Hyperpotassemia    Past Surgical History  Procedure Date  . Carpal tunnel release   . Appendectomy   . Abdominal hysterectomy   . Oophorectomy   . Tonsillectomy   . Spinal fusion     status post at least three back surgeries    reports that she quit smoking about 32 years ago. She does not have any smokeless tobacco history on file. She reports that she does not drink alcohol or use illicit drugs. family history includes Diabetes in her sister and Parkinsonism in her sister. Allergies  Allergen Reactions  . Beta Adrenergic Blockers     REACTION: depression  . Cephalexin   . Penicillins     REACTION: hives/intestinal tract  . Warfarin Sodium     REACTION: bleeding on skin surface    Review of Systems Denies loc    Objective:   Physical Exam GENERAL: no distress.  Obese.  In wheelchair. SKIN: Insulin injection  sites at the anterior abdomen are normal        Assessment & Plan:  Dm, apparently overcontrolled

## 2010-07-30 ENCOUNTER — Encounter: Payer: Self-pay | Admitting: Cardiology

## 2010-07-30 ENCOUNTER — Ambulatory Visit (INDEPENDENT_AMBULATORY_CARE_PROVIDER_SITE_OTHER): Payer: Medicare Other | Admitting: Cardiology

## 2010-07-30 VITALS — BP 136/82 | HR 60 | Resp 18 | Ht 66.0 in | Wt 221.0 lb

## 2010-07-30 DIAGNOSIS — I4891 Unspecified atrial fibrillation: Secondary | ICD-10-CM

## 2010-07-30 NOTE — Progress Notes (Signed)
   Patient ID: Shelia Woods, female    DOB: 06/21/1930, 77 y.o.   MRN: [id number]  HPI Shelia Woods comes in today for E and M of her history of PAF and HTN. She feels remarkedly better than usual. Denies palpitations, CP, and minimal dependent edema.  EKG today shows NSR with first degree AVB with PAC's and NSST changes.   Review of Systems  All other systems reviewed and are negative.      Physical Exam  Constitutional: She is oriented to person, place, and time.       Obese, in wheelchair.  HENT:  Head: Normocephalic and atraumatic.  Eyes: EOM are normal. Pupils are equal, round, and reactive to light.  Neck: Neck supple. No JVD present. No tracheal deviation present. No thyromegaly present.  Cardiovascular: Normal rate, regular rhythm, normal heart sounds and intact distal pulses.        Soft systolic murmur  Pulmonary/Chest: Effort normal and breath sounds normal.  Musculoskeletal:       Decreased ROM, 1 plus edema  Neurological: She is alert and oriented to person, place, and time.  Skin: Skin is dry.  Psychiatric: She has a normal mood and affect.

## 2010-07-30 NOTE — Assessment & Plan Note (Signed)
Stable, in NSR, no change in treatment.

## 2010-07-30 NOTE — Patient Instructions (Signed)
Your physician recommends that you schedule a follow-up appointment in: 6 months with Dr. Wall  

## 2010-08-04 LAB — HEMOGLOBIN A1C: Mean Plasma Glucose: 140 mg/dL

## 2010-08-04 LAB — URINALYSIS, ROUTINE W REFLEX MICROSCOPIC
Nitrite: NEGATIVE
Protein, ur: 30 mg/dL — AB
Specific Gravity, Urine: 1.014 (ref 1.005–1.030)
Urobilinogen, UA: 0.2 mg/dL (ref 0.0–1.0)

## 2010-08-04 LAB — GLUCOSE, CAPILLARY
Glucose-Capillary: 111 mg/dL — ABNORMAL HIGH (ref 70–99)
Glucose-Capillary: 117 mg/dL — ABNORMAL HIGH (ref 70–99)
Glucose-Capillary: 120 mg/dL — ABNORMAL HIGH (ref 70–99)
Glucose-Capillary: 122 mg/dL — ABNORMAL HIGH (ref 70–99)
Glucose-Capillary: 124 mg/dL — ABNORMAL HIGH (ref 70–99)
Glucose-Capillary: 127 mg/dL — ABNORMAL HIGH (ref 70–99)
Glucose-Capillary: 133 mg/dL — ABNORMAL HIGH (ref 70–99)
Glucose-Capillary: 143 mg/dL — ABNORMAL HIGH (ref 70–99)
Glucose-Capillary: 143 mg/dL — ABNORMAL HIGH (ref 70–99)
Glucose-Capillary: 146 mg/dL — ABNORMAL HIGH (ref 70–99)
Glucose-Capillary: 148 mg/dL — ABNORMAL HIGH (ref 70–99)

## 2010-08-04 LAB — URINE MICROSCOPIC-ADD ON

## 2010-08-04 LAB — COMPREHENSIVE METABOLIC PANEL
ALT: 17 U/L (ref 0–35)
AST: 23 U/L (ref 0–37)
Albumin: 2.9 g/dL — ABNORMAL LOW (ref 3.5–5.2)
Albumin: 3.5 g/dL (ref 3.5–5.2)
Alkaline Phosphatase: 53 U/L (ref 39–117)
Alkaline Phosphatase: 80 U/L (ref 39–117)
BUN: 21 mg/dL (ref 6–23)
BUN: 28 mg/dL — ABNORMAL HIGH (ref 6–23)
BUN: 32 mg/dL — ABNORMAL HIGH (ref 6–23)
CO2: 32 mEq/L (ref 19–32)
CO2: 26 mEq/L (ref 19–32)
Chloride: 93 mEq/L — ABNORMAL LOW (ref 96–112)
Chloride: 97 mEq/L (ref 96–112)
Creatinine, Ser: 1.24 mg/dL — ABNORMAL HIGH (ref 0.4–1.2)
Creatinine, Ser: 1.57 mg/dL — ABNORMAL HIGH (ref 0.4–1.2)
GFR calc Af Amer: 39 mL/min — ABNORMAL LOW (ref >60)
GFR calc non Af Amer: 42 mL/min — ABNORMAL LOW (ref >60)
GFR calc non Af Amer: 37 mL/min — ABNORMAL LOW (ref >60)
Glucose, Bld: 178 mg/dL — ABNORMAL HIGH (ref 70–99)
Potassium: 3 mEq/L — ABNORMAL LOW (ref 3.5–5.1)
Potassium: 3.4 mEq/L — ABNORMAL LOW (ref 3.5–5.1)
Potassium: 3.8 mEq/L (ref 3.5–5.1)
Sodium: 136 mEq/L (ref 135–145)
Total Bilirubin: 0.6 mg/dL (ref 0.3–1.2)
Total Bilirubin: 0.8 mg/dL (ref 0.3–1.2)
Total Protein: 7.3 g/dL (ref 6.0–8.3)
Total Protein: 8.1 g/dL (ref 6.0–8.3)

## 2010-08-04 LAB — DIFFERENTIAL
Basophils Absolute: 0 10*3/uL (ref 0.0–0.1)
Basophils Absolute: 0 10*3/uL (ref 0.0–0.1)
Basophils Relative: 0 % (ref 0–1)
Eosinophils Absolute: 0.1 10*3/uL (ref 0.0–0.7)
Eosinophils Relative: 1 % (ref 0–5)
Lymphocytes Relative: 13 % (ref 12–46)
Monocytes Absolute: 1.2 10*3/uL — ABNORMAL HIGH (ref 0.1–1.0)
Monocytes Relative: 12 % (ref 3–12)
Neutro Abs: 7.5 10*3/uL (ref 1.7–7.7)
Neutro Abs: 6.1 10*3/uL (ref 1.7–7.7)
Neutrophils Relative %: 70 % (ref 43–77)

## 2010-08-04 LAB — BASIC METABOLIC PANEL
CO2: 26 mEq/L (ref 19–32)
Calcium: 8.6 mg/dL (ref 8.4–10.5)
Creatinine, Ser: 1.41 mg/dL — ABNORMAL HIGH (ref 0.4–1.2)
GFR calc Af Amer: 51 mL/min — ABNORMAL LOW (ref >60)
GFR calc non Af Amer: 36 mL/min — ABNORMAL LOW (ref >60)
Glucose, Bld: 103 mg/dL — ABNORMAL HIGH (ref 70–99)
Glucose, Bld: 172 mg/dL — ABNORMAL HIGH (ref 70–99)
Potassium: 4.5 mEq/L (ref 3.5–5.1)
Sodium: 136 mEq/L (ref 135–145)
Sodium: 139 mEq/L (ref 135–145)

## 2010-08-04 LAB — CBC
HCT: 33 % — ABNORMAL LOW (ref 36.0–46.0)
HCT: 35.5 % — ABNORMAL LOW (ref 36.0–46.0)
HCT: 35.9 % — ABNORMAL LOW (ref 36.0–46.0)
HCT: 39.7 % (ref 36.0–46.0)
Hemoglobin: 11.3 g/dL — ABNORMAL LOW (ref 12.0–15.0)
Hemoglobin: 11.3 g/dL — ABNORMAL LOW (ref 12.0–15.0)
Hemoglobin: 12.1 g/dL (ref 12.0–15.0)
Hemoglobin: 12.5 g/dL (ref 12.0–15.0)
Hemoglobin: 13.1 g/dL (ref 12.0–15.0)
MCHC: 34.1 g/dL (ref 30.0–36.0)
MCHC: 34.8 g/dL (ref 30.0–36.0)
MCHC: 32.9 g/dL (ref 30.0–36.0)
MCV: 87.8 fL (ref 78.0–100.0)
MCV: 88.4 fL (ref 78.0–100.0)
Platelets: 294 10*3/uL (ref 150–400)
Platelets: 298 10*3/uL (ref 150–400)
Platelets: 330 K/uL (ref 150–400)
RBC: 3.75 MIL/uL — ABNORMAL LOW (ref 3.87–5.11)
RBC: 4.02 MIL/uL (ref 3.87–5.11)
RBC: 4.09 MIL/uL (ref 3.87–5.11)
RBC: 4.47 MIL/uL (ref 3.87–5.11)
RDW: 14 % (ref 11.5–15.5)
RDW: 14.2 % (ref 11.5–15.5)
RDW: 14.5 % (ref 11.5–15.5)
RDW: 13.6 % (ref 11.5–15.5)
WBC: 10.1 K/uL (ref 4.0–10.5)
WBC: 11.1 10*3/uL — ABNORMAL HIGH (ref 4.0–10.5)

## 2010-08-04 LAB — PROTIME-INR
INR: 1.1 (ref 0.00–1.49)
Prothrombin Time: 14.8 seconds (ref 11.6–15.2)

## 2010-08-07 ENCOUNTER — Other Ambulatory Visit: Payer: Self-pay | Admitting: *Deleted

## 2010-08-07 MED ORDER — HYDRALAZINE HCL 50 MG PO TABS: 50.0000 mg | ORAL_TABLET | Freq: Three times a day (TID) | ORAL | Status: DC

## 2010-08-07 MED ORDER — BUMETANIDE 1 MG PO TABS: 1.0000 mg | ORAL_TABLET | Freq: Two times a day (BID) | ORAL | Status: DC

## 2010-08-18 ENCOUNTER — Other Ambulatory Visit: Payer: Self-pay | Admitting: *Deleted

## 2010-08-18 MED ORDER — LOSARTAN POTASSIUM-HCTZ 100-12.5 MG PO TABS: 1.0000 | ORAL_TABLET | Freq: Every day | ORAL | Status: DC

## 2010-08-18 NOTE — Telephone Encounter (Signed)
Refill sent.

## 2010-09-09 NOTE — Assessment & Plan Note (Signed)
[facility]                            CARDIOLOGY OFFICE NOTE   Shelia Woods, Shelia Woods                        MRN:          [id number]  DATE:07/20/2007                            DOB:          1930/05/11    Shelia Woods returns today for further follow-up and management of the  following problems.  1. Difficult to control hypertension.  Shelia Woods blood pressures have been      excellent running around 123-137 over 52-61 over the past month.      Shelia Woods heart rates were 51-57.  Shelia Woods is on an extensive medical program      which I will list in a moment.  Shelia Woods biggest issue is that Avalide      is costing Shelia Woods $50 a month, but I think it is worth it.  Recent      blood work showed that Shelia Woods creatinine is starting arise at 1.5, and      Shelia Woods also has a lot of microalbumin and protein in the urine.  In      fact, it was so bad that Dr. Drue Novel thought about sending Shelia Woods to a      nephrologist.  2. Paroxysmal atrial fibrillation.  This is currently under good      control.  Shelia Woods does not report any recurrences.  3. Hyperlipidemia.  Shelia Woods lipids were under excellent control recently      checked by Dr. Drue Novel.  4. Coumadin intolerance.  5. 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  6. Type 2 diabetes.  Hemoglobin A1c was 6.7% by recent check.   Shelia Woods denies orthopnea, PND, or peripheral edema.  Shelia Woods actually feels  pretty good.   Shelia Woods medications are outlined in the chart and are unchanged since Shelia Woods  last visit, except now Shelia Woods is on hydralazine 50 mg t.i.d. per my  request.  Shelia Woods antihypertensive regimen includes enalapril 20 b.i.d.,  Avalide 300/25 daily, diltiazem extended release 240 daily, clonidine  0.3 b.i.d. spironolactone 25 mg a day, Bumex 1 mg a day, and hydralazine  50 t.i.d.   Shelia Woods blood pressure is 122/58, pulse is 60 and regular.  Weight is 249,  down 1.  HEENT is unchanged.  Shelia Woods is alert and oriented x3.  SKIN:  Is warm and dry.  No JVD.  Carotids are full without bruits.  Thyroid is not enlarged.  Trachea is midline.  LUNGS:  Clear.  HEART:  Reveals regular rate and rhythm.  PMI was poorly appreciated.  ABDOMINAL EXAM:  Obese with good bowel sounds.  There is no obvious  organomegaly.  EXTREMITIES:  With no cyanosis or clubbing.  There was trace edema.  Pulses are present.  NEUROLOGICAL EXAM:  Is intact.  SKIN:  Unremarkable.   I have spent a good 30 minutes talking to Shelia Woods and Shelia Woods  about the importance of keeping Shelia Woods blood pressure under good control;  hence, I think the Avalide is worth $50 a month.  It is investment for  any future renal dysfunction, not to mention risk of stroke or ischemic  heart disease.  After  long discussion, I think Shelia Woods feels it is worth it.  When a generic does become available, we will switch Shelia Woods over.  In  addition, Shelia Woods is already on an ACE inhibitor.   I have also talked to Shelia Woods at length about Shelia Woods blood sugar control.  Shelia Woods  is working with Dr. Drue Novel on this and at the present time seems pretty  good.   I will plan on seeing Shelia Woods back in 3 months.  I have made no changes  today in Shelia Woods program.     Maisie Fus C. Daleen Squibb, MD, [facility]  Electronically Signed    TCW/MedQ  DD: 07/20/2007  DT: 07/21/2007  Job #: [id number]   cc:   Willow Ora, MD

## 2010-09-09 NOTE — Assessment & Plan Note (Signed)
[facility]                            CARDIOLOGY OFFICE NOTE   AYANNAH, FADDIS                        MRN:          [id number]  DATE:01/17/2007                            DOB:          1930/05/12    Ms. Salazar returns today for further management of her difficult to  control hypertension. Her last visit we increased her hydralazine to 50  t.i.d.   Please see my previous notes for medication list and problem list.   Her blood pressure today is 138/50, pulse 60 and regular, weight 254  which is identical to her weight several weeks ago. Pressures at home  have averaged about 140 systolic over in the 60s in the diastolic. This  is really good for her.   The pressure in her head has resolved. She has no specific complaints.   The rest of her exam is unchanged.  LUNGS:  Clear.  HEART:  Regular rate and rhythm with no gallop.  EXTREMITIES:  Trace edema. Pulses are intact.   I am delighted with how Mrs. Edwin is doing. I have made no change in  her program. I will see her back in three months.     Thomas C. Daleen Squibb, MD, [facility]  Electronically Signed    TCW/MedQ  DD: 01/17/2007  DT: 01/18/2007  Job #: [id number]   cc:   Willow Ora, MD

## 2010-09-09 NOTE — Assessment & Plan Note (Signed)
[facility]                            CARDIOLOGY OFFICE NOTE   Shelia Woods, Shelia Woods                        MRN:          [id number]  DATE:11/01/2007                            DOB:          03-11-1931    Shelia Woods returns today.  Her blood pressures have been profoundly  improved and better with her current regimen.  Her blood pressure reads  are on the average about 115-120 systolic over the 50s to low 60s  diastolic.  Her heart rates run in the high 40s to high 50s.   She has no syncope or presyncope.  She had had no orthopnea or PND.  She  has really been strict on her diet and her blood sugars are running  around 100-110.  Last hemoglobin A1c was 6.7%.  Her LDL cholesterol was  68.7, HDL was up to 51.7, total cholesterol and HDL ratio was 3.  LFTs  were stable.  Her creatinine is stable at 1.5.  Her estimated GFR is  about 36.   I have reviewed all her medications.  She is scheduled to see a  nephrologist later this summer.  I told that they may want to decrease  her simvastatin at some point and also her metformin, but now she was  doing so well and was no need to do so.  Her blood pressure control  hopefully will really decrease her risk of future renal failure.   Her weight has also dropped down to 242.  This is from 249 back in  March!   She looks remarkably good.  Again, all her medications were review.   PHYSICAL EXAMINATION:  VITAL SIGNS:  Her blood pressure today was  151/59.  Her pulse was 65 and regular.  Her blood pressure is usually  around 120/60 in the office.  Her weight is 242.  Again, we have a long  list of blood pressures that are good.  HEENT:  Unchanged.  Carotids are full.  No bruits.  Thyroid is not  enlarged.  There is no JVD.  LUNGS:  Clear.  HEART:  Regular irregular rate and rhythm.  No gallop.  She has a well-  controlled ventricular rate.  ABDOMEN:  Unchanged.  EXTREMITIES:  Only trace edema.  Pulses are  intact.  NEUROLOGIC:  Intact.   I am really pleased with Shelia Woods weight reduction, significant  improved blood pressure control, not to mention her diabetic control.  Her lipids are near optimal except for elevated triglycerides.  I am  happy with her current medical program, though Nephrology may asked to  reduce her simvastatin and metformin down the road.  I will await on  their expert input.  I will plan on seeing her back again in 3 months.    Thomas C. Daleen Squibb, MD, [facility]  Electronically Signed   TCW/MedQ  DD: 11/01/2007  DT: 11/02/2007  Job #: [id number]   cc:   Shelia Ora, MD

## 2010-09-09 NOTE — Consult Note (Signed)
NAME:  Shelia Woods, Shelia Woods NO.:  [id number]   MEDICAL RECORD NO.:  [id number]          PATIENT TYPE:  INP   LOCATION:  2028                         FACILITY:  [facility]   PHYSICIAN:  Rollene Rotunda, MD, FACCDATE OF BIRTH:  07-Nov-1930   DATE OF CONSULTATION:  DATE OF DISCHARGE:                                 CONSULTATION   PRIMARY CARE PHYSICIAN:  Dr. Drue Novel.   REASON FOR PRESENTATION:  Difficult to control hypertension.   HISTORY OF PRESENT ILLNESS:  The patient is 77 year old white female  with a history of hypertension and apparently paroxysmal A-fib in the  past.  She is followed by Dr. Daleen Squibb.  She has had fluctuating blood  pressures in the past.  She noted her blood pressure to be elevated in  the 170s, in the 190s on August 12.  She was dizzy.  She did present to  Dr. Alwyn Ren on August 14.  She was noted to have elevated blood pressures  of 230/86.  There is a question of papilledema.  She was admitted and  initially treated with IV labetalol.  She had some brady arrhythmias.  Her meds otherwise have been continued, as listed below.  She has had  hydralazine added to her regimen.  Her blood pressures have actually  been fairly well controlled.  However, she has been running with heart  rates in the 40s and 50s.  She denies any new lightheadedness, pre-  syncope or syncope.  She has had no chest pain or shortness of breath.   At baseline, the patient gets along very limited by back pain.  She does  walk around in her house.  She uses a motorized chair for the most part.   PAST MEDICAL HISTORY:  Hypertension since in the 1980s, type 2 diabetes  mellitus since 1986, dyslipidemia, obesity, paroxysmal atrial  fibrillation the past, ischemia workup (December 23, 2005), EF 69% with no  evidence of ischemia).   PAST SURGICAL HISTORY:  Tonsillectomy, hysterectomy, carpal tunnel  surgery, back surgery x3, skin cancer resected, right oophorectomy,  appendectomy.   ALLERGIES:   PENICILLIN, COUMADIN, BETA BLOCKERS (CAUSE DEPRESSION).   MEDICATIONS:  1. Avalide 300/25.  2. Enalapril 40 mg daily.  3. Diltiazem 240 mg daily.  4. Clonidine 0.3 mg b.i.d.  5. Aspirin 81 mg daily.  6. Metformin 500 mg b.i.d.  7. Humulin insulin.  8. Etodolac.  9. Osteo BiFlex  10.Simvastatin 80 mg daily.  11.Tylenol.  12.Nizoral.   SOCIAL HISTORY:  The patient lives in [place] with her husband.  She  is retired.  She has quit smoking 1980s.  She does not drink alcohol.   FAMILY HISTORY:  Is contributory for father dying of heart problems at  age 73.   REVIEW OF SYSTEMS:  As stated in the HPI, positive for chronic lower  extremity swelling, hemorrhoids, constipation, polyuria, joint pains.  Negative for other systems.   PHYSICAL EXAMINATION:  GENERAL:  The patient is in no acute distress.  VITAL SIGNS:  Blood pressure 132/52, heart rate 52 and regular,  afebrile.  HEENT:  Eyes unremarkable.  Pupils equal, round, reactive to light,  fundi not visualized, oral mucosa unremarkable.  NECK:  No jugular venous distension at 45 degrees, carotid upstroke  brisk and symmetrical.  No bruits, no thyromegaly.  LYMPHATICS:  No cervical, axillary, inguinal adenopathy.  LUNGS:  Clear to auscultation bilaterally.  B  BACK:  No costovertebral history.  CHEST:  Unremarkable.  HEART:  PMI not displaced or sustained, S1-S2 within normal.  No S3, S4,  clicks, no rubs, no murmurs.  ABDOMEN:  Obese, positive bowel sounds, normal frequency and pitch, no  bruits, rebound, no guarding, no midline pulsatile mass.  No mass, no  hepatomegaly, no splenomegaly.  SKIN:  No rashes.  EXTREMITIES:  2+ pulse, no edema.   EKG:  Sinus rhythm, rate sinus bradycardia, rate 53, first-degree AV  block, axis within normal limits, nonspecific anterior T-wave  flattening.   ASSESSMENT/PLAN:  1. Hypertension.  The patient's blood pressure is difficult to      control.  She has had an MRA, which would be one  of the preferred      tests for ruling out renal artery stenosis.  She has no other      symptoms consistent with a secondary cause.  I do not see a TSH,      but we can get that in the office when she has blood draws.  Her      treatment is made somewhat more complicated by the bradycardia.      However, it is not a symptomatic bradycardia.  It think she is      tolerating the current regimen.  She was started on hydralazine,      this admission.  Actually would stop this in favor spironolactone.      This is often beneficial with difficult to control blood pressure,      as high renin states are very prevalent.  She would need to have a      BMET checked in 1 week and 1 month.  The dose of this would be 325      mg a day and then could be titrated upward.  As mentioned, I would      get a TSH, when she has her blood work done in 1 week.  2. Bradycardia as above.  She has persistent bradycardia, and we can      back up on the Cardizem.  That would be      advisable.  She could be followed with a Holter monitor, just to      make sure there is no significant sustained dysrhythmia.  Finally,      I would consider a sleep study, given the combination of      bradycardia and difficult-to-control hypertension in a morbidly      obese patient.  I will defer to Dr. Daleen Squibb.      Rollene Rotunda, MD, [facility]  Electronically Signed     JH/MEDQ  D:  12/13/2006  T:  12/14/2006  Job:  [id number]   cc:   Thomas C. Daleen Squibb, MD, [facility]  Willow Ora, MD

## 2010-09-09 NOTE — Assessment & Plan Note (Signed)
[facility]                            CARDIOLOGY OFFICE NOTE   SHANARA, SCHNIEDERS                        MRN:          [id number]  DATE:04/25/2007                            DOB:          03/14/1931    Ms. Wahba returns today for further management of the following issues:  1. Extremely difficult to control hypertension.  This has been much      better now that she is on hydralazine 50 mg t.i.d.  She is on 7      different classes of antihypertensives.  She has lost about 20-      something pounds and has kept it off.  Her blood pressure has been      remarkably better since that time.  Blood pressures she brings in      to me average about 135 over about 70 - 75.  She says she feels      remarkably better.  Her heart rate has been in the 60s.  2. Paroxysmal atrial fibrillation.  She remains in sinus rhythm and      has been in sinus rhythm at every visit that I have seen her.  3. Hyperlipidemia.  I referred her to Dr. Drue Novel for primary care.  4. COUMADIN INTOLERANCE.  5. 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  6. Type 2 diabetes.   She feels remarkably good.  Occasionally she will get a little  lightheaded when she gets up in the middle of the night.  She has not  fallen.  She would like her Jobe's stockings renewed which seem to help.  She wears these for venous insufficiency.   MEDICATIONS:  Unchanged.   Her blood pressure day is 122/60, pulse 64 and regular, weight is 250  down 20 pounds total __________ since last visit.  HEENT:  Unchanged.  Alert and oriented x3, respiratory rate is 18, unlabored.  She is  sitting in a wheelchair  NECK:  Shows no obvious JVD, carotids are full, thyroid is not enlarged.  LUNGS:  Clear.  HEART:  Reveals a regular rate and rhythm without gallop.  ABDOMINAL:  Soft, good bowel sounds, organomegaly could not be assessed.  EXTREMITIES:  Reveal no edema whatsoever, pulses are intact, she has  some venous  varicosities.  NEURO:  Exam is intact.   EKG confirms normal sinus rhythm with low voltage.   Ms. Grimley is doing remarkably well.  I applauded her efforts at getting  her weight off and keeping him off.  I think this is one of the keys to  her blood pressure.   She continues to have low blood pressure for her and if she has further  symptoms or orthostatic hypotension we will decrease her hydralazine to  25 mg t.i.d.  She will call and let us know if  there is a problem.  I renewed her prescription for her Jobe stockings.  Will see her back in 3 months.     Thomas C. Daleen Squibb, MD, [facility]  Electronically Signed    TCW/MedQ  DD: 04/25/2007  DT: 04/25/2007  Job #:  [id number] 

## 2010-09-09 NOTE — Procedures (Signed)
RENAL ARTERY DUPLEX EVALUATION   INDICATION:  Poorly controlled hypertension.   HISTORY:  Diabetes:  Yes.  Cardiac:  A-fib, murmur, palpitations.  Hypertension:  Yes.  Smoking:  Quit 30 years ago.   RENAL ARTERY DUPLEX FINDINGS:  Aorta-Proximal:  119 cm/s  Aorta-Mid:  _______ cm/s  Aorta-Distal:  _______ cm/s  Celiac Artery Origin:  _______ cm/s  SMA Origin:  _______ cm/s                                    RIGHT               LEFT  Renal Artery Origin:             117 cm/s            137 cm/s  Renal Artery Proximal:           93 cm/s             91 cm/s  Renal Artery Mid:                107 cm/s            93 cm/s  Renal Artery Distal:             90 cm/s             115 cm/s  Hilar Acceleration Time (AT):    34 m/s2             27 m/s2  Renal-Aortic Ratio (RAR):        >1.0                1.2  Kidney Size:                     12.2 cm X 5.6 cm    11.6 cm X 5.1 cm  End Diastolic Ratio (EDR):       0.31-0.34.          0.36-0.41.  Resistive Index (RI):            _______             _______   IMPRESSION:  No evidence of significant bilateral renal artery stenosis.   ___________________________________________  Larina Earthly, M.D.   AR/MEDQ  D:  01/05/2007  T:  01/06/2007  Job:  [id number]

## 2010-09-09 NOTE — Assessment & Plan Note (Signed)
[facility]                            CARDIOLOGY OFFICE NOTE   Shelia, Woods                        MRN:          [id number]  DATE:12/15/2006                            DOB:          [birth year]    Shelia Woods returns today after being discharged from the hospital  yesterday.  Her blood pressure is still very difficult to control.  Please see the discharge summary.   Dr. Antoine Poche added Spironolactone 25 mg daily.  She is on a whole host  of antihypertensives as outlined on the discharge summary.  Our goal for  her has been a systolic of less than 160.   She has a blood pressure today of 168/78, pulse 64 and regular.  Weight  is down 15 to 255.  LUNGS:  Clear.  There is no JVD.  HEART:  Regular rate and rhythm without gallop.  EXTREMITIES:  1+ edema.   ASSESSMENT/PLAN:  It is noted by Shelia Woods and Shelia Woods that her blood  pressure came down nicely on hydralazine in the hospital.  I think that  is the most logical addition at this point.  I started hydralazine 25 mg  p.o. t.i.d.  We will check a chem-7.  I will see her back in 2 weeks.  We may need to stop one of her diuretics, specifically the HCTZ  component of Avalide.  She probably needs Bumex with her edema, and the  Spironolactone hopefully will spare her potassium and help with some of  her refractory hypertension.     Thomas C. Daleen Squibb, MD, [facility]  Electronically Signed    TCW/MedQ  DD: 12/15/2006  DT: 12/16/2006  Job #: [id number]

## 2010-09-09 NOTE — Discharge Summary (Signed)
NAME:  Shelia Woods, Shelia Woods                 ACCOUNT NO.:  [id number]   MEDICAL RECORD NO.:  [id number]          PATIENT TYPE:  INP   LOCATION:  2028                         FACILITY:  [facility]   PHYSICIAN:  Raenette Rover. Felicity Coyer, MDDATE OF BIRTH:  05-19-1930   DATE OF ADMISSION:  12/09/2006  DATE OF DISCHARGE:  12/14/2006                               DISCHARGE SUMMARY   DISCHARGE DIAGNOSES:  1. Uncontrolled hypertension with difficult medical management status      post medication adjustment and cardiology evaluation.  Close      outpatient followup.  No evidence for secondary cause.  2. Mild asymptomatic bradycardia.  Consider outpatient Holter,      outpatient followup with cardiology.  3. Type 2 diabetes, reasonable control with hemoglobin A1c of 7.5.      Continue home medications.  4. Dyslipidemia.  Continue home statin.  5. Osteoarthritis.  Discontinuation of nonsteroidal antiinflammatory      and to continue Tylenol therapy in place.  6. Obesity.   DISCHARGE MEDICATIONS:  1. Avalide 300/25 one p.o. daily.  2. Enalapril 20 mg two tablets p.o. daily.  3. Diltiazem ER 240 mg p.o. daily.  4. Clonidine 0.3 mg p.o. b.i.d.  5. Bumex 1 mg p.o. daily.  6. Spironolactone 25 mg p.o. daily.  7. Aspirin 81 mg p.o. b.i.d.  8. Metformin 500 mg b.i.d.  9. Humulin N 26 units q.h.s.  10.Osteo Bi-Flex one tablet b.i.d.  11.Simvastatin 80 mg p.o. q.h.s.  12.Tylenol Arthritis two tablets b.i.d. and p.r.n.  13.Ketoconazole 2% cream apply vaginally p.r.n.  14.Centrum Silver one tablet daily.  15.Benadryl 25 mg q.h.s.  16.Calcium 600 mg tablets p.o. daily.   DISPOSITION:  Discharge home in medically stable and improved condition.  Her blood pressure still fluctuates, systolic pressures 130-180.  The  continued ongoing management will be pursued closely on an outpatient  basis by primary care and cardiology.  Hospital followup is scheduled at  [facility] with her primary care physician, Dr. Drue Novel  or perhaps  Dr. Alwyn Ren, next week for a blood pressure recheck as well as a basic  metabolic to monitor her renal function and electrolytes.  Will also  follow up with her cardiologist, Dr. Daleen Squibb, in 2 weeks per instructions  of Dr. Antoine Poche to consider Holter monitor if persisting or symptomatic  bradycardia.   HOSPITAL COURSE BY PROBLEM:  #1 - HYPERTENSIVE CRISIS.  The patient is a  pleasant 77 year old woman with diabetes and poorly-controlled  hypertension who was admitted by her primary care physician's office due  to accelerated hypertension with symptomatic dizziness.  Her pressure in  the office was 230/86 and recheck in several minutes showed it still to  be systolic in the 190s.  As such, given symptoms, she was admitted for  acute medical management of her hypertension.  She was placed on  labetalol drip as well as attempt at hydralazine and her diuretics were  switched to Bumex, and then at recommendation of cardiology to  spironolactone as well.  Pushing on diltiazem and clonidine were limited  by asymptomatic bradycardia.  Though during this hospitalization  perfect  blood pressure control was difficult to maintain, the patient  symptomatically was much improved.  She experienced no chest pain or  recurrent dizziness or head symptoms during this hospitalization.  She  was seen in cardiology consult on the day prior to discharge for any  other suggestions who felt that she was medically stable for discharge  home with close outpatient followup with rechecks and adjustment on an  as-needed ongoing basis.  This has been scheduled with her primary care  office for next week.  Office to contact her for more information on  exact day and time, and will recheck electrolytes as well.  This  information has been reviewed with the patient who understands plans for  followup.   #2 - OTHER MEDICAL ISSUES.  The patient's diabetes were managed during  this hospitalization with sliding  scale insulin although her NPH was  held to prevent hypoglycemia during testing.  She is safe to resume her  home medications as prior, as her A1c shows reasonable control at a  level of 7.5.  Further outpatient followup and management will be  deferred to primary MD.  Her dyslipidemia statin was continued as prior  to admission, and her arthritis medications were changed from NSAID to  Tylenol by cardiology service.      Valerie A. Felicity Coyer, MD  Electronically Signed     VAL/MEDQ  D:  12/14/2006  T:  12/14/2006  Job:  [id number]

## 2010-09-09 NOTE — H&P (Signed)
NAMESOLEIA, Woods NO.:  [id number]   MEDICAL RECORD NO.:  [id number]          PATIENT TYPE:  INP   LOCATION:  2907                         FACILITY:  [facility]   PHYSICIAN:  Titus Dubin. Hopper, MD,FACP,FCCPDATE OF BIRTH:  [birth year]   DATE OF ADMISSION:  12/09/2006  DATE OF DISCHARGE:                              HISTORY & PHYSICAL   Shelia Woods is a 77 year old white female admitted with accelerated  hypertension.   Her symptoms began on December 07, 2006, as dizziness; she did not notify  the physician but simply rested in bed.  On December 08, 2006, she still  had some vague symptoms in her head without definite headache.  Her  blood pressure was found to be 197/82 on self monitor.   On December 09, 2006, because of persistent symptoms of the vague head  pressure, she attempted to check a blood pressure.  On two occasions she  got error.  She stated that she had to pump the blood pressure cuff up  to a systolic over 250.  She still could not get an adequate reading but  noted vascular lesions below the cuff on the left forearm.   She called Dr. Vern Claude office and was told to come to Dr. Leta Jungling office  for a blood pressure check and notify them by 5:30 of recordings.  In  the office, her initial blood pressure was 230/86.  Recheck 20 minutes  later revealed a blood pressure of 190/80.  The profound hypertension,  despite five antihypertensive agents and the question of papilledema on  exam prompted admission for observation.   PAST MEDICAL HISTORY:  Her past medical history is long and complicated.  1. She had tonsillectomy in 1942;  2. In 1960, she had a right oophorectomy and tube resection as well as      an incidental appendectomy.  3. In 1968, she had disk surgery in the lumbosacral area.  4. In 1973, hysterectomy was performed.  5. She had carpal tunnel surgery in 1993.  6. A second disk surgery was done in 1994.  7. Arthritic spurs were removed in 1995.  8.  She had skin cancer on the left face in 2007.  9. She was hospitalized most recently with atrial fibrillation in      2007.   MEDICATIONS:  The five antihypertensive medications include  1. Avalide 300/25 one daily.  2. Enalapril 20 mg two daily.  3. Diltiazem ER 240 mg daily.  4. Clonidine 0.3 mg twice a day.  5. She is also on 81 mg aspirin two daily as well as  6. Etodolac 400 mg daily.  7. She is on Humulin N 26 units daily as well as  8. Metformin, and mg twice a day.  9. She is on simvastatin 80 mg but also takes  10.OsteoBiflex which would have glucosamine; this may increase LDL.  11.She takes two Extra Strength Tylenol twice a day.  12.She is on multivitamins, calcium supplements.  13.She also takes Benadryl as needed.   ALLERGIES AND INTOLERANCES:  She is intolerant or allergic to  PENICILLIN,  COUMADIN and BETA BLOCKERS.  Specifically the BETA BLOCKERS  cause depression.  PENICILLIN was associated with hives in the  intestinal tract.   SOCIAL HISTORY:  She does not drink or smoke.  She resides with her  husband.   FAMILY HISTORY:  Noncontributory to the present admission.   REVIEW OF SYSTEMS:  No definite headache; she has had no epistaxis.  She  has had no definite localizing neurologic signs or symptoms such as  disequilibrium, memory loss or paresthesias.   She denies any chest pain or palpitations at this time.   She has had no cough, sputum or shortness of breath.  She does have  exertional dyspnea.   She denies polydipsia or polyphagia.  She has polyuria each morning.   She states that her blood sugars have been less than 112 fasting,  although her A1c was 7 apparently one month prior to admission.   She denies any nonhealing skin lesions.  The only new skin lesions are  those related to the blood pressure monitoring earlier today.   She has no dysuria, hematuria or other genitourinary symptoms other than  the polyuria.   She denies dyspepsia, rectal  bleeding or melena.   She has no localizing neuromuscular or musculoskeletal signs other than  the chronic back issues for which she takes Tylenol and etodolac.   PHYSICAL EXAMINATION:  GENERAL APPEARANCE:  She has stigmata clinically  of the  metabolic syndrome.  She appears somewhat anxious but is in no  acute distress.  HEENT:  Otolaryngologic exam revealed clear canals.  The lenses were  clear; as noted the optic disks were blurred and papilledema could not  be ruled out.  I did not appreciate any hemorrhages.  Nares were patent.  Dental hygiene is good.  LYMPHATIC:  She has no lymphadenopathy about the head, neck or axilla.  Thyroid is substernal.  CHEST:  Chest is clear with no increased work of breathing at rest.  CARDIAC:  Heart sounds are somewhat distant.  ABDOMEN:  Abdomen is protuberant and nontender.  I cannot appreciate  organomegaly.  EXTREMITIES:  Peripheral pulses are intact.  There is no edema.  NEUROLOGIC:  She ambulates using a rolling walker with no obvious  dysfunction.   She is admitted to observation because of the accelerated blood pressure  despite five drugs.  Hydralazine 10 mg four times a day will be  initiated.  I did not initiate IV therapy immediately as there had been  drop in the systolic blood pressure of 40 points of during the first 20  minutes of her office visit.   The metformin will be held as imaging studies may be necessary.  The old  chart was not available; it is unclear whether she has had MRA/MRI of  the renal arteries to rule out renal artery stenosis as component of her  resistant hypertension.   He did recommend she discontinue the etodolac as it eliminates the  cardioprotectivity of the aspirin and also has black box warnings of  increased cardiovascular risk &/or GI risk.   Additionally, if her lipids are not controlled, then the glucosamine  should be taken discontinued.   She will be placed on sensitive sliding scale insulin  t.i.d. a.c.   The other antihypertensive medications will be continued.  Cardiac  enzymes will be checked.      Titus Dubin. Alwyn Ren, MD,FACP,FCCP  Electronically Signed     WFH/MEDQ  D:  12/10/2006  T:  12/10/2006  Job:  [id number]   cc:   Thomas C. Daleen Squibb, MD, [facility]  Willow Ora, MD

## 2010-09-09 NOTE — Assessment & Plan Note (Signed)
[facility]                            CARDIOLOGY OFFICE NOTE   PEGI, MILAZZO                        MRN:          [id number]  DATE:12/30/2006                            DOB:          [birth year]    Ms. Shelia Woods comes back for close monitoring of her difficult to control  hypertension.   I added hydralazine 25 mg p.o. t.i.d. since this worked so nicely in the  hospital.  Her blood pressures have dropped about 10 pounds, from the  160s down to around 145-150.  She has had 2 readings earlier this month  that were back up to 160 and 173.  Her diastolics are always good.  Her  heart rate runs in the 50s and low 60s.  She had a Chem-7 last visit  which showed a stable creatinine of 1, potassium of 3.9 on  spironolactone 25 mg a day which was added in the hospital. She feels  well.  She says her edema has decreased on the spironolactone.   PHYSICAL EXAMINATION:  VITAL SIGNS:  Her blood pressure today was  177/75.  Her pulse is 70 and regular.  NECK:  Shows no JVD.  Carotids are full.  LUNGS:  Clear.  HEART:  Reveals an irregular rate and rhythm.  ABDOMEN:  Soft.  EXTREMITIES:  Reveal no edema.  Pulses are intact.   Her EKG actually shows sinus rhythm today.   I think we gained some benefit from low dose hydralazine.  I have  increased her hydralazine to 50 mg p.o. t.i.d.  Hopefully this will get  her blood pressure down about 140-145 on the average which should be our  goal.  This would be really good for her as she reinforces as well.  It  is remarkable that she is in a sinus rhythm today.   I will plan on seeing her back again on September 22nd on a day her  husband has a visit.  She will keep some blood pressure readings for me  in the meantime.     Thomas C. Daleen Squibb, MD, [facility]  Electronically Signed    TCW/MedQ  DD: 12/30/2006  DT: 12/30/2006  Job #: [id number]   cc:   Pearlean Brownie, M.D.

## 2010-09-09 NOTE — H&P (Signed)
NAME:  Shelia Woods, NESSEL NO.:  [id number]   MEDICAL RECORD NO.:  [id number]          PATIENT TYPE:  EMS   LOCATION:  MAJO                         FACILITY:  [facility]   PHYSICIAN:  Clovis Pu. Cornett, M.D.DATE OF BIRTH:  [birth year]   DATE OF ADMISSION:  10/15/2008  DATE OF DISCHARGE:                              HISTORY & PHYSICAL   CHIEF COMPLAINT:  Abdominal pain.   HISTORY OF PRESENT ILLNESS:  The patient is a 77 year old female with a  4-day history of abdominal pain.  It started on Thursday.  It was  diffuse.  It was mild to moderate in intensity without radiation.  She  had diarrhea with it initially and then one other bowel movement since  then.  It is associated with nausea and vomiting on Wednesday or  Thursday she states and then again today.  She was seen at [facility]  Emergency Room.  The pain was fairly significant at a level of 8 at its  greatest.  There has been no blood in her stool or emesis.  Acute  abdominal series showed a small bowel obstruction.  She was transferred  to [facility].   PAST MEDICAL HISTORY:  1. Diabetes mellitus.  2. Atrial fibrillation.  3. Arthritis.  4. High cholesterol.  5. Hypertension.   PAST SURGICAL HISTORY:  Hysterectomy.   SOCIAL HISTORY:  She is married.  Denies tobacco, alcohol use, or drug  use.   FAMILY HISTORY:  Noncontributory.   ALLERGIES:  COUMADIN, PENICILLIN, BETA-BLOCKERS.   MEDICATIONS:  1. Amlodipine 5 mg daily.  2. Aspirin 81 mg daily.  3. Avalide 25 mg daily.  4. Bumetanide 1 mg daily.  5. Clonidine 0.3 mg daily.  6. Humulin R 50 units daily.  7. Simvastatin 40 mg daily.   REVIEW OF SYSTEMS:  Positive for the above, otherwise negative x15  points.   PHYSICAL EXAMINATION:  VITAL SIGNS:  Temperature 98, pulse 91, blood  pressure 168/51, respiratory rate is 20.  HEENT:  Extraocular movements are intact.  No jaundice.  NECK:  Supple and nontender.  Trachea midline.  PULMONARY:  Lung  sounds are clear bilaterally.  Chest wall motion  normal.  CARDIOVASCULAR:  Regular rate and rhythm without rub, murmur, or gallop.  EXTREMITIES:  Warm and well perfused.  Trace edema.  Otherwise muscle  tone and range of motion are normal.  ABDOMEN:  Distended, tympanitic.  Bowel sounds are present.  Mildly  tender without rebound or guarding.  No evidence of hernia.  NEURO:  Glasgow Coma Scale is 15.  Motor and sensory functions are  grossly intact.   DIAGNOSTIC STUDIES:  I reviewed her plain films which showed small bowel  obstruction.  She has a white count of 8700, hemoglobin of 13, platelet  counts 233,000.  She does have no evidence of shift.  Urinalysis is  otherwise normal except for some trace ketones, bilirubin, sodium 136,  potassium 3.8, chloride 93, CO2 26, BUN 32, creatinine 1.4, and glucose  178.   IMPRESSION:  1. Small bowel obstruction.  2. Mild dehydration.  3. Type  2 diabetes mellitus.  4. Hypertension.  5. Obesity.  6. Arthritis.  7. Hyperlipidemia.   PLAN:  We will admit for IV fluids, hydration, NG tube decompression,  and abdominal pelvic CT scan deferred following her intraabdominal  pathology.  We discussed her care with her family.      Thomas A. Cornett, M.D.  Electronically Signed     TAC/MEDQ  D:  10/15/2008  T:  10/15/2008  Job:  [id number]

## 2010-09-09 NOTE — Assessment & Plan Note (Signed)
[facility]                            CARDIOLOGY OFFICE NOTE   CAMRIN, LAPRE                        MRN:          [id number]  DATE:02/01/2008                            DOB:          1931/03/26    Shelia Woods returns today for further management of her paroxysmal atrial  fibrillation and difficult to control hypertension.   She is now following along with Dr. Paulo Fruit, who she really  likes in her [facility] office.  In addition, Dr. Andrey Campanile has taken  initiative and working with her antihypertensive meds.  Specifically  because Shelia Woods was feeling so poorly and her creatinine has increased  as had her potassium and she has discontinued her enalapril.  She also  stopped her spironolactone.   The rest of her medicines are the same as outlined in my previous note.   She has not been plagued by any clinical atrial fib.  She is very  compliant.  She brings a whole list of blood pressures today which are  on the average of 120-130 systolic.  Over the last week or so, they have  been running a little higher at around 140 systolic.  The diastolics  were all good.   PHYSICAL EXAMINATION:  Her blood pressure today is 162/67, her pulse is  60 and regular, her weight is 248 which is up 6 pounds.  HEENT is  normal.  Carotids upstrokes are equal bilaterally without bruits, no  JVD.  Thyroid is not enlarged.  Trachea is midline.  Lungs are clear.  Heart reveals an irregular rate and rhythm.  No gallop.  Abdomen exam is  soft, good bowel sounds.  Extremities revealed no significant edema.  Pulses are intact.   I had a long talk with Shelia Woods today.  She has seen Dr. Annie Sable of Nephrology as well.  With Dr. Andrey Campanile and Dr.  Kathrene Bongo working with her blood pressure, I will step out of the  picture.  We will continue to follow her closely for atrial fib and  anticoagulation.   I will plan on seeing her back again in 6  months.     Thomas C. Daleen Squibb, MD, [facility]  Electronically Signed    TCW/MedQ  DD: 02/01/2008  DT: 02/02/2008  Job #: [id number]

## 2010-09-12 NOTE — Discharge Summary (Signed)
NAMEKERRIANN, KAMPHUIS NO.:  [id number]   MEDICAL RECORD NO.:  [id number]          PATIENT TYPE:  INP   LOCATION:  2032                         FACILITY:  [facility]   PHYSICIAN:  Angelia Mould. Derrell Lolling, M.D.DATE OF BIRTH:  [birth year]   DATE OF ADMISSION:  10/15/2008  DATE OF DISCHARGE:  10/18/2008                               DISCHARGE SUMMARY   ADMITTING PHYSICIAN:  Maisie Fus A. Cornett, MD   DISCHARGING PHYSICIAN:  Angelia Mould. Derrell Lolling, MD   PROCEDURES:  None.   CONSULTANTS:  [facility].   REASON FOR ADMISSION:  Ms. Kronberg is a 77 year old female with a 4-day  history of abdominal pain.  Her pain was described as diffuse and mild  to moderate in intensity without radiation.  She had diarrhea initially  with this but then has only had 1 bowel movement since then.  She did  have some associated nausea and vomiting.  She went to the [facility]  Emergency Department where an acute abdominal series showed a small  bowel obstruction, and she was transferred to [facility] for admission.  The patient was admitted and NG tube was placed, and Cardiology was  consulted due to her atrial fibrillation and hypertension.  Please see  admitting history and physical for further details.   ADMITTING DIAGNOSES:  1. Small bowel obstruction.  2. Mild dehydration.  3. Type 2 diabetes mellitus.  4. Hypertension.  5. Obesity.  6. Arthritis.  7. Hyperlipidemia.   HOSPITAL COURSE:  At this time, the patient was admitted and placed on  IV fluids, as well as an NG tube for decompression.  By hospital day 1,  the patient was feeling better with much less pain.  At this time, her  NG tube has fallen out.  A repeat x-ray showed a partial small bowel  obstruction and therefore, her NG tube was left out, but she was  continued n.p.o.  The following day, the patient states she had had 3  bowel movements and was very hungry and therefore, was started on clear  liquids.  Her diet was then  advanced as tolerated, and by the following  day, the patient was felt stable for discharge home.   DISCHARGE DIAGNOSES:  1. Small bowel obstruction, resolved.  2. Diabetes mellitus.  3. Atrial fibrillation.  4. Arthritis.  5. Hypercholesterolemia.  6. Hypertension.   DISCHARGE MEDICATIONS:  1. Amlodipine 5 mg daily.  2. Aspirin 81 mg daily.  3. Avalide 25 mg daily.  4. Bumetanide 1 mg daily.  5. Clonidine 0.3 mg daily.  6. Humulin R 15 units daily.  7. Simvastatin 40 mg daily.   DISCHARGE INSTRUCTIONS:  The patient may resume a low-sodium heart-  healthy diet.  She may increase her activity, to walk up steps.  She may  shower.  She is to return to see her primary care physician in  approximately 2 weeks.  Otherwise, she is encouraged to get a  colonoscopy for colorectal cancer screening.      Letha Cape, PA      [place]. Derrell Lolling, M.D.  Electronically  Signed    KEO/MEDQ  D:  11/15/2008  T:  11/16/2008  Job:  [id number]

## 2010-09-12 NOTE — Assessment & Plan Note (Signed)
[facility]                            CARDIOLOGY OFFICE NOTE   VETRA, SHINALL                        MRN:          [id number]  DATE:06/15/2006                            DOB:          [birth year]    I was asked by Dr. Deirdre Priest to establish with Junious Dresser as her  cardiologist.  She just moved here from [place].  I also saw her husband  today.   She is 77 years old, married, and just moved back to live close to her  son.   She has a history of a heart murmur and atrial fibrillation.  She has  had hypertension since the mid 80s, and also type 2 diabetes since 1986.   She has never been told she has had a coronary problem or vascular  issue.  I do have an adenosine Cardiolite from December 23, 2005, which  showed an EF of 69%, normal wall motion, no ischemia.   She walks with a walker.  She denies any current symptoms of ischemia or  angina.  Her blood pressure usually is quite high when she gets around.  It has been running higher lately, and she has been concerned.   PAST MEDICAL HISTORY:  SHE IS INTOLERANT OF PENICILLIN.   She has no dye reactions.  She has also had significant bleeding  problems in her eye, and also under her skin with Coumadin.  This was  discontinued by her cardiologist.   She is on:  1. Avalide 300/25 daily.  2. Enalapril 20 mg p.o. b.i.d.  3. Diltiazem extended release 240 daily.  4. Clonidine 0.2 b.i.d.  5. Aspirin 81 mg p.o. b.i.d.  6. Metformin 500 b.i.d.  7. Humulin N as directed.  8. Etodolac 400 mg a day.  9. Glucosamine chondroitin b.i.d.  10.Simvastatin 80 mg a day.  11.Tylenol 2 b.i.d.  12.Multivitamin.  13.Calcium.  14.Bumetanide 1 mg p.r.n.   She does not smoke.  She did smoke until 46.  She does not use any  alcohol.  She does have a history of hyperlipidemia.   Surgeries include:  1. Tonsillectomy in 1942.  2. Right oophorectomy.  3. Tubal.  4. Appendectomy in 1960.  5. Disk surgery on  her back in 1968.  6. Hysterectomy in 1973.  7. Hand surgery carpal tunnels in 19147.  8. Back surgery for a disk in 1994.  9. Back surgery for removal of arthritic mass and bone in 1995.   SOCIAL HISTORY:  She has 4 children.  She is here with her husband  today.  She is retired since 1994.   REVIEW OF SYSTEMS:  Other than the HPI, is negative.   EXAMINATION:  Her blood pressure was 225/84 in left arm with the  automatic cuff.  We repeated it after been sitting for a while with a  manual large cuff in the left arm, it was 224/72.  Her pulse is 60 and  regular.  She is 5 feet 6 and weighs 270 pounds.  She is very immobile.  HEENT:  Normocephalic and atraumatic.  PERRLA.  Extraocular movements  intact.  Sclerae are clear.  Facial symmetry is normal.  Dentition  actually satisfactory with 1 small plate.  NECK:  Supple.  Carotid upstrokes are equal bilaterally without bruits.  There is no JVD.  Thyroid is not enlarged.  Trachea is midline.  LUNGS:  Clear to auscultation.  HEART:  Reveals a soft S1 and S2.  She is in sinus rhythm today, as  confirmed by EKG.  ABDOMINAL EXAM:  Obese with good bowel sounds.  Organomegaly could not  be adequately assessed.  EXTREMITIES:  Reveal 1+ edema bilaterally.  Pulses are present.  NEUROLOGIC:  Exam is grossly intact.  SKIN:  Shows a few ecchymoses.   ASSESSMENT AND PLAN:  1. Chronic atrial fibrillation.  I am not sure she is really      intolerance of Coumadin, but she gives a history of such.  I have      increased her aspirin to 325 mg a day for thromboembolic      prevention.  2. Severe systolic hypertension.  I have increased her clonidine to      0.3 b.i.d.  We will follow up in 4 weeks for further titration of      her medicines.  Next time I will probably change her enalapril to      40 mg once a day.  She also may benefit from increasing her      diltiazem if her heart rate will tolerate it.  3. Hyperlipidemia.  On a statin.  Unknown  values at present.  4. Type 2 diabetes.  5. Obesity.     Thomas C. Daleen Squibb, MD, [facility]  Electronically Signed    TCW/MedQ  DD: 06/15/2006  DT: 06/16/2006  Job #: [id number]   cc:   Pearlean Brownie, M.D.

## 2010-09-12 NOTE — Assessment & Plan Note (Signed)
[facility]                            CARDIOLOGY OFFICE NOTE   LAM, BJORKLUND                        MRN:          [id number]  DATE:08/12/2006                            DOB:          July 19, 1930    Ms. Cuadrado comes in today for close followup of her blood pressure and  chronic atrial fibrillation.   She was having problems with her stomach and said she had to cut her  aspirin back from 325 to 81 mg per day. She is very compliant with her  medications. Her blood pressures always run 160-170 throughout the  years.   Her last visit we changed her enalapril to 40 mg once a day as opposed  to 20 b.i.d. The rest of the medicines with left as they were. Her  clonidine was increased on the previous visit.   Her systolic pressure has dropped from 225 to 176 to today being 160  with a large cuff. Her pressure is 160/84, her heart rate is 60 and  regular. The rest of her exam is unchanged.   I am pleased with the response that Ms. Rumore has obtained. I have made  no changes. We have pretty well maxed out on all the current medications  she is on and would have to add an additional drug. Beta blockers make  her severely depressed. We would have to use something like hydralazine  which she would have to take 3 or 4 times a day. For the time being, I  think we will be satisfied with this current level.   I will plan on seeing her back in 6 months.     Thomas C. Daleen Squibb, MD, [facility]  Electronically Signed    TCW/MedQ  DD: 08/12/2006  DT: 08/13/2006  Job #: [id number]   cc:   Pearlean Brownie, M.D.

## 2010-09-12 NOTE — Assessment & Plan Note (Signed)
[facility]                            CARDIOLOGY OFFICE NOTE   Shelia, Woods                        MRN:          [id number]  DATE:07/12/2006                            DOB:          07/21/1930    Shelia Woods returns today for close followup, and to get to know her  better from her initial visit with me on June 15, 2006.  Please see  my note at that time.   Her blood pressure was 225/184 at that time.  We increased her clonidine  to 0.3 b.i.d.   PHYSICAL EXAMINATION:  VITAL SIGNS:  Her blood pressures have now been  running in the 170 systolic range.  She is 176/80 today.  Her pulse is  60 and irregular.  GENERAL:  She is in no acute distress.  SKIN:  Warm and dry.  LUNGS:  Clear.  NECK:  There is no JVD.  HEART:  Reveals an irregular rate and rhythm that is well controlled.  EXTREMITIES:  Reveal trace edema.  Pulses are intact.   Shelia Woods blood pressure is significantly better.  However, we need to  make some additional changes.  I have changed her enalapril from 20  b.i.d. to 40 once a day.  If this is not successful, we will change her  lisinopril to 40 mg a day for potential better 24-hour coverage.  We  will continue with her Avalide 300/25 daily, diltiazem extended release  240 daily (I cannot really increase this with a resting heart rate of  60), clonidine 0.3 b.i.d.   She recently had blood work and said her cholesterol was remarkably good  with Dr. Deirdre Priest.  I will see her back in 3 weeks for blood pressure  followup.     Thomas C. Daleen Squibb, MD, [facility]  Electronically Signed    TCW/MedQ  DD: 07/12/2006  DT: 07/13/2006  Job #: [id number]   cc:   Pearlean Brownie, M.D.

## 2010-10-23 ENCOUNTER — Other Ambulatory Visit: Payer: Self-pay | Admitting: Family Medicine

## 2010-10-23 MED ORDER — ATORVASTATIN CALCIUM 20 MG PO TABS: 20.0000 mg | ORAL_TABLET | Freq: Every day | ORAL | Status: DC

## 2010-10-23 NOTE — Telephone Encounter (Signed)
Refill sent.

## 2010-11-24 ENCOUNTER — Encounter: Payer: Self-pay | Admitting: *Deleted

## 2010-11-24 NOTE — Telephone Encounter (Signed)
A user error has taken place: encounter opened in error, closed for administrative reasons.

## 2010-12-04 ENCOUNTER — Other Ambulatory Visit: Payer: Self-pay | Admitting: Family Medicine

## 2010-12-04 MED ORDER — FENOFIBRATE 160 MG PO TABS: 160.0000 mg | ORAL_TABLET | Freq: Every day | ORAL | Status: DC

## 2010-12-04 MED ORDER — HYDRALAZINE HCL 50 MG PO TABS: 50.0000 mg | ORAL_TABLET | Freq: Three times a day (TID) | ORAL | Status: DC

## 2010-12-04 NOTE — Telephone Encounter (Signed)
done

## 2010-12-09 ENCOUNTER — Other Ambulatory Visit: Payer: Self-pay | Admitting: Family Medicine

## 2010-12-09 NOTE — Telephone Encounter (Signed)
Patient said pharmacy fax request several times for bumex (generic) [facility] [name]

## 2010-12-10 MED ORDER — BUMETANIDE 1 MG PO TABS: 1.0000 mg | ORAL_TABLET | Freq: Two times a day (BID) | ORAL | Status: DC

## 2010-12-10 NOTE — Telephone Encounter (Signed)
Ok for 3 refills but she is overdue for appt

## 2010-12-10 NOTE — Telephone Encounter (Signed)
Saw endo in April.

## 2010-12-10 NOTE — Telephone Encounter (Signed)
Done

## 2010-12-15 ENCOUNTER — Encounter: Payer: Self-pay | Admitting: Endocrinology

## 2010-12-15 ENCOUNTER — Ambulatory Visit (INDEPENDENT_AMBULATORY_CARE_PROVIDER_SITE_OTHER): Payer: Medicare Other | Admitting: Endocrinology

## 2010-12-15 ENCOUNTER — Other Ambulatory Visit (INDEPENDENT_AMBULATORY_CARE_PROVIDER_SITE_OTHER): Payer: Medicare Other

## 2010-12-15 VITALS — BP 142/64 | HR 56 | Temp 97.8°F | Ht 65.5 in | Wt 249.1 lb

## 2010-12-15 DIAGNOSIS — E119 Type 2 diabetes mellitus without complications: Secondary | ICD-10-CM

## 2010-12-15 NOTE — Progress Notes (Signed)
Subjective:    Patient ID: Shelia Woods, female    DOB: 01-29-1931, 77 y.o.   MRN: [id number]  HPI pt states she feels well in general.  she brings a record of her cbg's which i have reviewed today.  It varies from 118-190.  It is in general highest in am, and lower at other times.   Past Medical History  Diagnosis Date  . PAROXYSMAL ATRIAL FIBRILLATION 09/29/2008  . HYPERCHOLESTEROLEMIA 06/24/2006  . HYPERTENSION, BENIGN SYSTEMIC 06/24/2006  . FATIGUE 11/22/2007  . RENAL FAILURE 11/22/2007  . RENAL INSUFFICIENCY, CHRONIC 04/10/2008  . Proteinuria 05/17/2008  . Dermatophytosis of the body 11/30/2007  . NEOP, MALIGNANT, SKIN NOS 11/08/2006  . DIABETES MELLITUS II, UNCOMPLICATED 06/24/2006  . SYNDROME, CARPAL TUNNEL 11/08/2006  . PERIPHERAL NEUROPATHY, LOWER EXTREMITY 11/08/2006  . MACULAR DEGENERATION, SENILE, NONEXUDATIVE 01/01/2010  . CATARACTS, SENILE, NUCLEAR, BILATERAL 01/01/2010  . SMALL BOWEL OBSTRUCTION 11/05/2008  . INGROWN TOENAIL, INFECTED 04/10/2008  . OSTEOARTHRITIS, LOWER LEG 06/24/2006  . MUSCLE SPASM, TRAPEZIUS 04/02/2010  . Hyperpotassemia     Past Surgical History  Procedure Date  . Carpal tunnel release   . Appendectomy   . Abdominal hysterectomy   . Oophorectomy   . Tonsillectomy   . Spinal fusion     status post at least three back surgeries    History   Social History  . Marital Status: Married    Spouse Name: N/A    Number of Children: 4  . Years of Education: N/A   Occupational History  .      retired/disabled (1994)   Social History Main Topics  . Smoking status: Former Smoker    Quit date: 04/27/1978  . Smokeless tobacco: Not on file  . Alcohol Use: No  . Drug Use: No  . Sexually Active: Not on file   Other Topics Concern  . Not on file   Social History Narrative   Four childrenRegular exercise-no    Current Outpatient Prescriptions on File Prior to Visit  Medication Sig Dispense Refill  . acetaminophen (TYLENOL) 500 MG tablet 2 by mouth  three times a day       . aspirin 81 MG chewable tablet Take 2 tablets by mouth once daily       . atorvastatin (LIPITOR) 20 MG tablet Take 1 tablet (20 mg total) by mouth at bedtime.  30 tablet  6  . bumetanide (BUMEX) 1 MG tablet Take 1 tablet (1 mg total) by mouth 2 (two) times daily.  60 tablet  3  . Calcium 500 MG tablet 1 tablet by mouth once daily       . cetirizine (ZYRTEC) 10 MG tablet Take 10 mg by mouth daily as needed.       . cloNIDine (CATAPRES) 0.3 MG tablet Take 1 tablet (0.3 mg total) by mouth 2 (two) times daily.  60 tablet  6  . cyclobenzaprine (FLEXERIL) 10 MG tablet Take 10 mg by mouth 2 (two) times daily as needed. For back pain.       . diphenhydrAMINE (BENADRYL) 25 MG tablet Take 25 mg by mouth daily.        . fenofibrate 160 MG tablet Take 1 tablet (160 mg total) by mouth daily.  30 tablet  5  . Glucosamine-Chondroitin (OSTEO BI-FLEX REGULAR STRENGTH) 250-200 MG TABS Take 2 tablets by mouth daily.        Marland Kitchen glucose blood (ONE TOUCH ULTRA TEST) test strip Once daily dx 250.00  100 each  3  . hydrALAZINE (APRESOLINE) 50 MG tablet Take 1 tablet (50 mg total) by mouth 3 (three) times daily.  90 tablet  0  . insulin NPH (HUMULIN N) 100 UNIT/ML injection Inject 30 Units into the skin at bedtime.        . insulin regular (HUMULIN R,NOVOLIN R) 100 UNIT/ML injection 3x a day (just before each meal) 02-09-24 units  20 mL  11  . Insulin Syringe-Needle U-100 (THINPRO INSULIN SYRINGE) 31G X 3/8" 0.3 ML MISC Any brand, four times a day  360 each  11  . Lancets (ONETOUCH ULTRASOFT) lancets Use as instructed       . losartan-hydrochlorothiazide (HYZAAR) 100-12.5 MG per tablet Take 1 tablet by mouth daily.  30 tablet  6  . Multiple Vitamins-Minerals (ICAPS) TABS Take 1 tablet by mouth 2 (two) times daily.        Marland Kitchen nystatin-triamcinolone (MYCOLOG II) cream Apply sparingly twice daily to affected areas       . ondansetron (ZOFRAN) 8 MG tablet Take by mouth every 8 (eight) hours as needed.  For nausea       . polyethylene glycol (MIRALAX) powder As needed       . TAZTIA XT 300 MG 24 hr capsule Take 300 mg by mouth daily.         Allergies  Allergen Reactions  . Beta Adrenergic Blockers     REACTION: depression  . Cephalexin   . Penicillins     REACTION: hives/intestinal tract  . Warfarin Sodium     REACTION: bleeding on skin surface    Family History  Problem Relation Age of Onset  . Diabetes Sister   . Parkinsonism Sister     BP 142/64  Pulse 56  Temp(Src) 97.8 F (36.6 C) (Oral)  Ht 5' 5.5" (1.664 m)  Wt 249 lb 1.9 oz (113 kg)  BMI 40.83 kg/m2  SpO2 94%  Review of Systems denies hypoglycemia    Objective:   Physical Exam GENERAL: no distress.  Obese.  In wheelchair.  PSYCH: Alert and oriented x 3.  Does not appear anxious nor depressed.  Lab Results  Component Value Date   HGBA1C 6.7* 12/15/2010      Assessment & Plan:  Dm, well-controlled

## 2010-12-15 NOTE — Patient Instructions (Addendum)
check your blood sugar 2 times a day.  vary the time of day when you check, between before the 3 meals, and at bedtime.  also check if you have symptoms of your blood sugar being too high or too low.  please keep a record of the readings and bring it to your next appointment here.  please call us sooner if you are having low blood sugar episodes.  blood tests are being requested for you today.  please call [phone] to hear your test results.  You will be prompted to enter the 9-digit "MRN" number that appears at the top left of this page, followed by #.  Then you will hear the message.  pending the test results, please continue regular insulin three times a day (just before each meal), 02-09-24 units, and: continue nph insulin, 30 units at bedtime.  Please schedule a follow-up appointment in 4 months.  At that appointment, please come in without compression stockings, so we can check your feet.   (update: i left message on phone-tree:  Same rx)

## 2010-12-18 ENCOUNTER — Other Ambulatory Visit: Payer: Self-pay | Admitting: Family Medicine

## 2010-12-18 MED ORDER — DILTIAZEM HCL ER BEADS 300 MG PO CP24: 300.0000 mg | ORAL_CAPSULE | Freq: Every day | ORAL | Status: DC

## 2010-12-18 NOTE — Telephone Encounter (Signed)
Rx sent 

## 2011-01-02 ENCOUNTER — Other Ambulatory Visit: Payer: Self-pay | Admitting: Family Medicine

## 2011-01-02 MED ORDER — HYDRALAZINE HCL 50 MG PO TABS: 50.0000 mg | ORAL_TABLET | Freq: Three times a day (TID) | ORAL | Status: DC

## 2011-01-02 NOTE — Telephone Encounter (Signed)
Done

## 2011-01-06 ENCOUNTER — Other Ambulatory Visit: Payer: Self-pay | Admitting: Endocrinology

## 2011-01-07 ENCOUNTER — Telehealth: Payer: Self-pay | Admitting: *Deleted

## 2011-01-07 MED ORDER — INSULIN NPH (HUMAN) (ISOPHANE) 100 UNIT/ML ~~LOC~~ SUSP: 30.0000 [IU] | Freq: Every day | SUBCUTANEOUS | Status: DC

## 2011-01-07 NOTE — Telephone Encounter (Signed)
Ok to change--same dosage 

## 2011-01-07 NOTE — Telephone Encounter (Signed)
Rx changed to Novolin N, pharmacy informed, pt informed.

## 2011-01-07 NOTE — Telephone Encounter (Signed)
[facility] called to request that pt's Humulin N be changed to Relion Novolin N because they no longer receive Humulin N through their manufacturer.

## 2011-01-12 ENCOUNTER — Encounter: Payer: Self-pay | Admitting: Family Medicine

## 2011-01-12 ENCOUNTER — Ambulatory Visit (INDEPENDENT_AMBULATORY_CARE_PROVIDER_SITE_OTHER): Payer: Medicare Other | Admitting: Family Medicine

## 2011-01-12 DIAGNOSIS — I10 Essential (primary) hypertension: Secondary | ICD-10-CM

## 2011-01-12 DIAGNOSIS — E7800 Pure hypercholesterolemia, unspecified: Secondary | ICD-10-CM

## 2011-01-12 DIAGNOSIS — Z23 Encounter for immunization: Secondary | ICD-10-CM

## 2011-01-12 DIAGNOSIS — Z0000 Encounter for general adult medical examination without abnormal findings: Secondary | ICD-10-CM

## 2011-01-12 DIAGNOSIS — E119 Type 2 diabetes mellitus without complications: Secondary | ICD-10-CM

## 2011-01-12 LAB — LIPID PANEL
Cholesterol: 186 mg/dL (ref 0–200)
Total CHOL/HDL Ratio: 4
Triglycerides: 255 mg/dL — ABNORMAL HIGH (ref 0.0–149.0)
VLDL: 51 mg/dL — ABNORMAL HIGH (ref 0.0–40.0)

## 2011-01-12 LAB — CBC WITH DIFFERENTIAL/PLATELET
Basophils Absolute: 0 10*3/uL (ref 0.0–0.1)
Eosinophils Absolute: 0.1 10*3/uL (ref 0.0–0.7)
HCT: 37 % (ref 36.0–46.0)
Hemoglobin: 12.2 g/dL (ref 12.0–15.0)
Lymphs Abs: 1.5 10*3/uL (ref 0.7–4.0)
MCHC: 33 g/dL (ref 30.0–36.0)
MCV: 89.6 fl (ref 78.0–100.0)
Monocytes Absolute: 0.7 10*3/uL (ref 0.1–1.0)
Neutro Abs: 3.8 10*3/uL (ref 1.4–7.7)
RDW: 14.8 % — ABNORMAL HIGH (ref 11.5–14.6)

## 2011-01-12 LAB — HEPATIC FUNCTION PANEL
Bilirubin, Direct: 0 mg/dL (ref 0.0–0.3)
Total Bilirubin: 0.5 mg/dL (ref 0.3–1.2)

## 2011-01-12 LAB — BASIC METABOLIC PANEL
BUN: 38 mg/dL — ABNORMAL HIGH (ref 6–23)
Calcium: 10.3 mg/dL (ref 8.4–10.5)
Creatinine, Ser: 1.4 mg/dL — ABNORMAL HIGH (ref 0.4–1.2)

## 2011-01-12 LAB — TSH: TSH: 1.74 u[IU]/mL (ref 0.35–5.50)

## 2011-01-12 NOTE — Progress Notes (Signed)
  Subjective:    Patient ID: Shelia Woods, female    DOB: 03/13/1931, 77 y.o.   MRN: [id number]  HPI Here today for CPE.  Risk Factors: DM- chronic problem, now following w/ Endo.  Insulin being adjusted by Dr Everardo All- 'that's working out really good'. HTN- chronic problem, has been difficult to control.  On Bumex, clonidine, dilt, hydralazine, Hyzaar.  BP somewhat elevated today but pt reports Hydralazine often causes brief elevation after taking.  Has not taken other meds today.  No CP, SOB, HAs, visual changes, edema. Hyperlipidemia- chronic problem, on Lipitor.  No abd pain, N/V, myalgias. Physical Activity: very limited, will use wheel chair for 'long walks' but otherwise uses walker Fall risk: elevated risk but feels steady w/ walker.  Not interested in PT to improve strength or balance Depression: denies sxs Hearing: normal to whispered voice at 6 ft ADL's: independent Cognitive: normal linear thought process, memory and concentration intact Home Safety: feels safe at home, lives w/ husband Height, Weight, BMI, Visual Acuity: see vitals, vision corrected to 20/20 w/ glasses. Counseling:  Has not had colonoscopy, 'and i'm not up for one'.  Not interested in mammo due to difficulty w/ standing.  UTD on eye exam.  Has had pneumovax. Labs Ordered: See A&P Care Plan: See A&P    Review of Systems Patient reports no vision/ hearing changes, adenopathy,fever, weight change,  persistant/recurrent hoarseness , swallowing issues, chest pain, palpitations, edema, persistant/recurrent cough, hemoptysis, dyspnea (rest/exertional/paroxysmal nocturnal), gastrointestinal bleeding (melena, rectal bleeding), abdominal pain, significant heartburn, bowel changes, GU symptoms (dysuria, hematuria, incontinence), Gyn symptoms (abnormal  bleeding, pain),  syncope, focal weakness, memory loss, numbness & tingling, skin/hair/nail changes, abnormal bruising or bleeding, anxiety, or depression.     Objective:   Physical Exam  General Appearance:    Alert, cooperative, no distress, appears stated age  Head:    Normocephalic, without obvious abnormality, atraumatic  Eyes:    PERRL, conjunctiva/corneas clear, EOM's intact, both eyes  Ears:    Normal TM's and external ear canals, both ears  Nose:   Nares normal, septum midline, mucosa normal, no drainage    or sinus tenderness  Throat:   Lips, mucosa, and tongue normal; teeth and gums normal  Neck:   Supple, symmetrical, trachea midline, no adenopathy;    Thyroid: no enlargement/tenderness/nodules  Back:     Pt sitting in wheel chair- unable to assess  Lungs:     Clear to auscultation bilaterally, respirations unlabored  Chest Wall:    No tenderness or deformity   Heart:    Regular rate and rhythm, S1 and S2 normal, no murmur, rub   or gallop  Breast Exam:    Pt declines  Abdomen:     Soft, non-tender, bowel sounds active all four quadrants,    no masses, no organomegaly  Genitalia:    Pt declines  Rectal:    Extremities:   Extremities normal, atraumatic, no cyanosis or edema  Pulses:   2+ and symmetric all extremities  Skin:   Skin color, texture, turgor normal, no rashes or lesions  Lymph nodes:   Cervical, supraclavicular, and axillary nodes normal  Neurologic:   CNII-XII intact, decreased strength, normal sensation          Assessment & Plan:

## 2011-01-12 NOTE — Patient Instructions (Signed)
Follow up in 6 months to recheck blood pressure and cholesterol We'll notify you of your lab results Keep up the good work- you look great! Call your insurance company and check on the shingles vaccine- here vs pharmacy Call with any questions or concerns Have a great Fall season!!!

## 2011-01-13 ENCOUNTER — Encounter: Payer: Self-pay | Admitting: Family Medicine

## 2011-01-13 DIAGNOSIS — Z0000 Encounter for general adult medical examination without abnormal findings: Secondary | ICD-10-CM | POA: Insufficient documentation

## 2011-01-13 NOTE — Assessment & Plan Note (Signed)
Pt's PE limited due to position in wheelchair but systems examined are WNL.  Pt declines mammo, colonoscopy, DEXA.  Check labs.  Anticipatory guidance provided.

## 2011-01-13 NOTE — Assessment & Plan Note (Signed)
Slightly elevated today but has not taken meds.  Asymptomatic.  No changes.

## 2011-01-13 NOTE — Assessment & Plan Note (Signed)
Chronic problem for pt- now following w/ Endo.

## 2011-01-13 NOTE — Assessment & Plan Note (Signed)
Chronic problem for pt, on both fenofibrate and lipitor.  Tolerating meds w/out problem.  Check labs.  Adjust meds prn.

## 2011-01-15 ENCOUNTER — Telehealth: Payer: Self-pay

## 2011-01-15 NOTE — Telephone Encounter (Signed)
Message copied by Beverely Low on Thu Jan 15, 2011 10:50 AM ------      Message from: Sheliah Hatch      Created: Wed Jan 14, 2011  4:58 PM       Kidney function is stable      Triglycerides are elevated- this is due to diet.  Should pay close attention to food choices.      No changes at this time

## 2011-01-15 NOTE — Telephone Encounter (Signed)
Pt aware.

## 2011-01-22 ENCOUNTER — Other Ambulatory Visit: Payer: Self-pay | Admitting: Family Medicine

## 2011-01-22 MED ORDER — ZOSTER VACCINE LIVE 19400 UNT/0.65ML ~~LOC~~ SOLR: 0.6500 mL | Freq: Once | SUBCUTANEOUS | Status: DC

## 2011-01-22 MED ORDER — DILTIAZEM HCL ER BEADS 300 MG PO CP24: 300.0000 mg | ORAL_CAPSULE | Freq: Every day | ORAL | Status: DC

## 2011-01-22 NOTE — Telephone Encounter (Signed)
Last OV 01/12/11. Last filled 12/18/10

## 2011-01-22 NOTE — Telephone Encounter (Signed)
Done

## 2011-01-22 NOTE — Telephone Encounter (Signed)
Ok for 6 months- ask pt if she wants #90 or #30

## 2011-01-29 ENCOUNTER — Other Ambulatory Visit: Payer: Self-pay | Admitting: Family Medicine

## 2011-01-29 MED ORDER — HYDRALAZINE HCL 50 MG PO TABS: 50.0000 mg | ORAL_TABLET | Freq: Three times a day (TID) | ORAL | Status: DC

## 2011-01-29 NOTE — Telephone Encounter (Signed)
Done

## 2011-02-05 ENCOUNTER — Other Ambulatory Visit: Payer: Self-pay | Admitting: Family Medicine

## 2011-02-05 MED ORDER — CLONIDINE HCL 0.3 MG PO TABS: 0.3000 mg | ORAL_TABLET | Freq: Two times a day (BID) | ORAL | Status: DC

## 2011-02-05 NOTE — Telephone Encounter (Signed)
Done

## 2011-02-06 LAB — BASIC METABOLIC PANEL
CO2: 28
Chloride: 101
GFR calc Af Amer: >60
Potassium: 3.5

## 2011-02-09 LAB — DIFFERENTIAL
Basophils Absolute: 0
Lymphocytes Relative: 19
Monocytes Absolute: 0.6
Monocytes Relative: 7
Neutro Abs: 6.5

## 2011-02-09 LAB — CK TOTAL AND CKMB (NOT AT ARMC): Total CK: 49

## 2011-02-09 LAB — CBC
HCT: 42.7
Hemoglobin: 14.3
MCHC: 33.5
MCV: 86.1
RBC: 4.96

## 2011-02-09 LAB — BASIC METABOLIC PANEL
CO2: 28
Chloride: 104
GFR calc Af Amer: >60
Glucose, Bld: 137 — ABNORMAL HIGH
Potassium: 3.7
Sodium: 141

## 2011-02-09 LAB — HEMOGLOBIN A1C: Mean Plasma Glucose: 190

## 2011-02-18 ENCOUNTER — Telehealth: Payer: Self-pay

## 2011-02-18 ENCOUNTER — Other Ambulatory Visit: Payer: Self-pay | Admitting: Family Medicine

## 2011-02-18 NOTE — Telephone Encounter (Signed)
Pt informed and will monitor CBGs and call back on Friday with reading

## 2011-02-18 NOTE — Telephone Encounter (Signed)
Pt's spouse call stating pt's CBGs have been fluctuating from 275 yesterday to 150 last night and 235 today. Husband says pt is more tired that usual. Dr Everardo All is not in clinic this afternoon, please advise.

## 2011-02-18 NOTE — Telephone Encounter (Signed)
Based on her sugar readings she will need to increase her insulin.  If she is still taking her mealtime insulin 02-09-24 she will need to increase each of these by 2 units (04-12-26).  She should increase her NPH to 33 units at night.  She needs to check her sugars regularly and touch base w/ Dr Everardo All when he returns.  If she is not feeling well- she may need an appt (often infection will cause sugars to go up).

## 2011-02-19 NOTE — Telephone Encounter (Signed)
Pt states that she does not need the zofran refill

## 2011-02-19 NOTE — Telephone Encounter (Signed)
Ok for #20 but please ask why she needs this

## 2011-02-19 NOTE — Telephone Encounter (Signed)
Last OV 01/12/11. Last filled 07/10/10

## 2011-02-23 ENCOUNTER — Telehealth: Payer: Self-pay | Admitting: *Deleted

## 2011-02-23 NOTE — Telephone Encounter (Signed)
Pt c/o some lightness on last week and BP was 186/85,154/84. Pt BP today 173/73 . Pt  notes that BP has been running in 170 s and 80-90s. Pt states that she has already taking BP med this am around 8 am. Pt is asymptomatic now, Pt denies any dizziness, headache, SOB, numbness, tingling or chest pain.Pt also indicated that BS were also elevated at same time of BP spike and she has spoke with ENDO about that issue. Please advise

## 2011-02-23 NOTE — Telephone Encounter (Signed)
Left message to call office

## 2011-02-23 NOTE — Telephone Encounter (Signed)
Elevated BP may be in response to her symptoms from the elevated sugars.  If her BP remains elevated after she gets the sugars controlled, she'll need an OV to address.  If she develops any symptoms like CP, SOB, HAs, dizziness she needs to go to the ER.

## 2011-02-23 NOTE — Telephone Encounter (Signed)
Pt returned call OV scheduled and Pt advised of warning signs to be evaluated in ED. Pt ok info and verbalized understanding.

## 2011-02-24 ENCOUNTER — Other Ambulatory Visit: Payer: Self-pay | Admitting: Family Medicine

## 2011-02-25 ENCOUNTER — Ambulatory Visit (INDEPENDENT_AMBULATORY_CARE_PROVIDER_SITE_OTHER): Payer: Medicare Other | Admitting: Family Medicine

## 2011-02-25 ENCOUNTER — Encounter: Payer: Self-pay | Admitting: Family Medicine

## 2011-02-25 DIAGNOSIS — I10 Essential (primary) hypertension: Secondary | ICD-10-CM

## 2011-02-25 DIAGNOSIS — J309 Allergic rhinitis, unspecified: Secondary | ICD-10-CM

## 2011-02-25 MED ORDER — ONDANSETRON HCL 8 MG PO TABS: 8.0000 mg | ORAL_TABLET | Freq: Three times a day (TID) | ORAL | Status: DC | PRN

## 2011-02-25 NOTE — Telephone Encounter (Signed)
rx sent to pharmacy by e-script For zofran

## 2011-02-25 NOTE — Patient Instructions (Signed)
Schedule an appt in 2 weeks to recheck blood pressure You currently have sinus inflammation- this could be the cause of your dizziness Restart the Zyrtec daily Use the nasal spray- 2 sprays each nostril daily Call cardiology and schedule an appt- I don't want to adjust your BP meds right now b/c it's been so good and i don't want to lower your pulse Hang in there!!!

## 2011-02-25 NOTE — Progress Notes (Signed)
  Subjective:    Patient ID: Shelia Woods, female    DOB: 1930-07-05, 77 y.o.   MRN: [id number]  HPI HTN- chronic problem.  Recently BP has been labile.  Reports increased activity- cooking, cleaning.  'i've felt good up until last week and then i started feeling light headed'.  Woke up in the middle of the night to use bathroom and felt like she was going sideways.  Still lightheaded when walking.  BP ranging from 154-186/73-90.  Pulse ranging 54-70.  Denies CP, SOB.  'my head feels like it's full of cotton'.   Review of Systems For ROS see HPI     Objective:   Physical Exam  Vitals reviewed. Constitutional: She appears well-developed and well-nourished. No distress.  HENT:  Head: Normocephalic and atraumatic.  Right Ear: Tympanic membrane normal.  Left Ear: Tympanic membrane normal.  Nose: Mucosal edema and rhinorrhea present. Right sinus exhibits no maxillary sinus tenderness and no frontal sinus tenderness. Left sinus exhibits no maxillary sinus tenderness and no frontal sinus tenderness.  Mouth/Throat: Mucous membranes are normal. Posterior oropharyngeal erythema (w/ PND) present.  Eyes: Conjunctivae and EOM are normal. Pupils are equal, round, and reactive to light.  Neck: Normal range of motion. Neck supple.  Cardiovascular: Normal rate and regular rhythm.   Murmur heard. Pulmonary/Chest: Effort normal and breath sounds normal. No respiratory distress. She has no wheezes. She has no rales.  Musculoskeletal: She exhibits edema (trace LE edema bilaterally).  Lymphadenopathy:    She has no cervical adenopathy.          Assessment & Plan:

## 2011-03-01 NOTE — Assessment & Plan Note (Signed)
Chronic problem- deteriorated.  Hx of labile BP.  Pulse remains well controlled.  Hx of multiple med intolerances.  Hesitate to adjust meds due to risk of lowering pulse rate and causing syncope.  Some of pt's dizziness is likely due to her deconditioned state.  Elevated BP may be related to current sinus/allergy sxs.  Will follow closely.  Reviewed supportive care and red flags that should prompt return.  Pt expressed understanding and is in agreement w/ plan.

## 2011-03-01 NOTE — Assessment & Plan Note (Signed)
Chronic problem- deteriorated.  Pt currently w/ signs of sinus inflammation but not infxn.  Restart antihistamine and nasal steroid.  Reviewed supportive care and red flags that should prompt return.  Pt expressed understanding and is in agreement w/ plan.

## 2011-03-04 ENCOUNTER — Other Ambulatory Visit: Payer: Self-pay | Admitting: Family Medicine

## 2011-03-05 MED ORDER — ONDANSETRON HCL 8 MG PO TABS: 8.0000 mg | ORAL_TABLET | Freq: Three times a day (TID) | ORAL | Status: DC | PRN

## 2011-03-05 NOTE — Telephone Encounter (Signed)
rx sent to pharmacy by e-script For zofran 

## 2011-03-06 ENCOUNTER — Other Ambulatory Visit: Payer: Self-pay | Admitting: Endocrinology

## 2011-03-09 ENCOUNTER — Other Ambulatory Visit: Payer: Self-pay | Admitting: *Deleted

## 2011-03-09 MED ORDER — GLUCOSE BLOOD VI STRP: ORAL_STRIP | Status: DC

## 2011-03-09 NOTE — Telephone Encounter (Signed)
R'cd fax from [facility] for refill of test strips  Last OV-11/2010 Last filled-03/08/2011

## 2011-03-11 ENCOUNTER — Ambulatory Visit (INDEPENDENT_AMBULATORY_CARE_PROVIDER_SITE_OTHER): Payer: Medicare Other | Admitting: Family Medicine

## 2011-03-11 ENCOUNTER — Encounter: Payer: Self-pay | Admitting: Family Medicine

## 2011-03-11 VITALS — BP 140/90 | HR 60 | Temp 98.4°F | Ht 66.0 in | Wt 250.6 lb

## 2011-03-11 DIAGNOSIS — I10 Essential (primary) hypertension: Secondary | ICD-10-CM

## 2011-03-11 MED ORDER — NYSTATIN-TRIAMCINOLONE 100000-0.1 UNIT/GM-% EX CREA: TOPICAL_CREAM | Freq: Two times a day (BID) | CUTANEOUS | Status: DC

## 2011-03-11 NOTE — Progress Notes (Signed)
  Subjective:    Patient ID: Shelia Woods, female    DOB: 1930/06/29, 77 y.o.   MRN: [id number]  HPI HTN- home BPs recently have been running 139-163/72-83.  No CP, SOB, HAs, visual changes, edema.   Review of Systems For ROS see HPI     Objective:   Physical Exam  Vitals reviewed. Constitutional: She is oriented to person, place, and time. She appears well-developed and well-nourished. No distress.  HENT:  Head: Normocephalic and atraumatic.  Eyes: Conjunctivae and EOM are normal. Pupils are equal, round, and reactive to light.  Neck: Normal range of motion. Neck supple. No thyromegaly present.  Cardiovascular: Normal rate, regular rhythm, normal heart sounds and intact distal pulses.   No murmur heard. Pulmonary/Chest: Effort normal and breath sounds normal. No respiratory distress.  Abdominal: Soft. She exhibits no distension. There is no tenderness.  Musculoskeletal: She exhibits edema (trace to +1 bilaterally).  Lymphadenopathy:    She has no cervical adenopathy.  Neurological: She is alert and oriented to person, place, and time.  Skin: Skin is warm and dry.  Psychiatric: She has a normal mood and affect. Her behavior is normal.          Assessment & Plan:

## 2011-03-11 NOTE — Patient Instructions (Signed)
Follow up as scheduled- sooner if you need me You look great!  Keep up the good work! Call with any questions or concerns Happy Thanksgiving!

## 2011-03-12 ENCOUNTER — Other Ambulatory Visit: Payer: Self-pay | Admitting: Family Medicine

## 2011-03-12 MED ORDER — LOSARTAN POTASSIUM-HCTZ 100-12.5 MG PO TABS: 1.0000 | ORAL_TABLET | Freq: Every day | ORAL | Status: DC

## 2011-03-12 NOTE — Telephone Encounter (Signed)
rx sent to pharmacy by e-script  

## 2011-03-14 NOTE — Assessment & Plan Note (Signed)
BP has improved since last visit.  Hx of labile, hard to control pressures.  She is very sensitive to med changes.  Asymptomatic.  No changes at this time.  Will continue to follow.

## 2011-04-08 ENCOUNTER — Other Ambulatory Visit: Payer: Self-pay | Admitting: Family Medicine

## 2011-04-08 MED ORDER — BUMETANIDE 1 MG PO TABS: 1.0000 mg | ORAL_TABLET | Freq: Two times a day (BID) | ORAL | Status: DC

## 2011-04-08 NOTE — Telephone Encounter (Signed)
rx sent to pharmacy by e-script  

## 2011-04-13 ENCOUNTER — Ambulatory Visit: Payer: Medicare Other | Admitting: Endocrinology

## 2011-05-07 ENCOUNTER — Other Ambulatory Visit: Payer: Self-pay | Admitting: Family Medicine

## 2011-05-07 MED ORDER — HYDRALAZINE HCL 50 MG PO TABS: 50.0000 mg | ORAL_TABLET | Freq: Three times a day (TID) | ORAL | Status: DC

## 2011-05-07 NOTE — Telephone Encounter (Signed)
rx sent to pharmacy by e-script  

## 2011-05-14 ENCOUNTER — Ambulatory Visit: Payer: Medicare Other | Admitting: Endocrinology

## 2011-05-14 ENCOUNTER — Telehealth: Payer: Self-pay | Admitting: Family Medicine

## 2011-05-14 MED ORDER — ATORVASTATIN CALCIUM 20 MG PO TABS: 20.0000 mg | ORAL_TABLET | Freq: Every day | ORAL | Status: DC

## 2011-05-14 NOTE — Telephone Encounter (Signed)
rx sent to pharmacy by e-script  

## 2011-05-14 NOTE — Telephone Encounter (Signed)
Refill- atorvastatin 20mg  tablets. Take one tablet by mouth every night at bedtime. Qty 30 last fill 12.19.12

## 2011-06-11 ENCOUNTER — Telehealth: Payer: Self-pay | Admitting: Family Medicine

## 2011-06-11 MED ORDER — FENOFIBRATE 160 MG PO TABS: 160.0000 mg | ORAL_TABLET | Freq: Every day | ORAL | Status: DC

## 2011-06-11 NOTE — Telephone Encounter (Signed)
rx sent to pharmacy by e-script  

## 2011-06-11 NOTE — Telephone Encounter (Signed)
Refill- fenofibrate 160mg  tablets. Take one tablet by mouth daily. Qty 30 last fill 1.10.13

## 2011-07-13 ENCOUNTER — Ambulatory Visit: Payer: Medicare Other | Admitting: Family Medicine

## 2011-07-20 ENCOUNTER — Ambulatory Visit: Payer: Medicare Other | Admitting: Family Medicine

## 2011-07-22 ENCOUNTER — Encounter: Payer: Self-pay | Admitting: Family Medicine

## 2011-07-22 ENCOUNTER — Ambulatory Visit (INDEPENDENT_AMBULATORY_CARE_PROVIDER_SITE_OTHER): Payer: Medicare Other | Admitting: Family Medicine

## 2011-07-22 VITALS — BP 128/84 | HR 51 | Temp 98.0°F | Ht 66.0 in | Wt 251.2 lb

## 2011-07-22 DIAGNOSIS — I10 Essential (primary) hypertension: Secondary | ICD-10-CM

## 2011-07-22 DIAGNOSIS — E7800 Pure hypercholesterolemia, unspecified: Secondary | ICD-10-CM

## 2011-07-22 DIAGNOSIS — E119 Type 2 diabetes mellitus without complications: Secondary | ICD-10-CM

## 2011-07-22 DIAGNOSIS — J309 Allergic rhinitis, unspecified: Secondary | ICD-10-CM

## 2011-07-22 MED ORDER — GLUCOSE BLOOD VI STRP: ORAL_STRIP | Status: DC

## 2011-07-22 MED ORDER — ONETOUCH ULTRASOFT LANCETS MISC: Status: DC

## 2011-07-22 MED ORDER — INSULIN REGULAR HUMAN 100 UNIT/ML IJ SOLN: INTRAMUSCULAR | Status: DC

## 2011-07-22 MED ORDER — INSULIN NPH (HUMAN) (ISOPHANE) 100 UNIT/ML ~~LOC~~ SUSP: 30.0000 [IU] | Freq: Every day | SUBCUTANEOUS | Status: DC

## 2011-07-22 NOTE — Assessment & Plan Note (Signed)
Chronic problem.  Excellent control today.  Asymptomatic.  Continue current meds.

## 2011-07-22 NOTE — Patient Instructions (Signed)
Follow up in 3 months to check your diabetes We'll notify you of your lab results Restart the nasal spray- 2 sprays each nostril Call with any questions or concerns Happy Belated Birthday!

## 2011-07-22 NOTE — Assessment & Plan Note (Signed)
Pt would like to resume care w/ me.  Check A1C.  UTD on eye exam.  Asymptomatic.  Adjust meds prn.

## 2011-07-22 NOTE — Assessment & Plan Note (Signed)
Chronic problem.  Continue antihistamine.  Add nasal steroid.  Sample of Qnasl given.

## 2011-07-22 NOTE — Progress Notes (Signed)
  Subjective:    Patient ID: Shelia Woods, female    DOB: Feb 18, 1931, 78 y.o.   MRN: [id number]  HPI HTN- chronic problem, well controlled today.  On Hyzaar, hydralazine, dilt, clonidine, bumex.  No CP, SOB, HAs, visual changes, edema.  Hyperlipidemia- chronic problem, on lipitor daily.  No abd pain, N/V, myalgias.  DM- chronic problem.  Has been seeing Dr Everardo All- likes him and is appreciative- but is having difficulty getting to appts.  UTD on eye exam.  On Novolin N (30 QHS) and Humalin R (04-12-29)  Allergic rhinitis- chronic problem, taking Zyrtec daily.  Having increased nasal congestion and drainage, itchy watery eyes.   Review of Systems For ROS see HPI     Objective:   Physical Exam  Vitals reviewed. Constitutional: She is oriented to person, place, and time. She appears well-developed and well-nourished. No distress.  HENT:  Head: Normocephalic and atraumatic.  Eyes: Conjunctivae and EOM are normal. Pupils are equal, round, and reactive to light.  Neck: Normal range of motion. Neck supple. No thyromegaly present.  Cardiovascular: Normal rate, regular rhythm and intact distal pulses.   Murmur (I/VI SEM) heard. Pulmonary/Chest: Effort normal and breath sounds normal. No respiratory distress.  Abdominal: Soft. She exhibits no distension. There is no tenderness.  Musculoskeletal: She exhibits no edema.  Lymphadenopathy:    She has no cervical adenopathy.  Neurological: She is alert and oriented to person, place, and time.  Skin: Skin is warm and dry.  Psychiatric: She has a normal mood and affect. Her behavior is normal.          Assessment & Plan:

## 2011-07-22 NOTE — Assessment & Plan Note (Signed)
Chronic problem.  Tolerating statin w/out difficulty.  Check labs.  Adjust meds prn  

## 2011-07-23 LAB — HEPATIC FUNCTION PANEL
ALT: 18 U/L (ref 0–35)
Bilirubin, Direct: 0 mg/dL (ref 0.0–0.3)
Total Bilirubin: 0.3 mg/dL (ref 0.3–1.2)

## 2011-07-23 LAB — HEMOGLOBIN A1C: Hgb A1c MFr Bld: 6.8 % — ABNORMAL HIGH (ref 4.6–6.5)

## 2011-07-23 LAB — BASIC METABOLIC PANEL
Chloride: 99 mEq/L (ref 96–112)
Creatinine, Ser: 1.6 mg/dL — ABNORMAL HIGH (ref 0.4–1.2)
GFR: 33.52 mL/min — ABNORMAL LOW (ref >60.00)

## 2011-07-23 LAB — LIPID PANEL
HDL: 43.9 mg/dL (ref >39.00)
Total CHOL/HDL Ratio: 4
Triglycerides: 345 mg/dL — ABNORMAL HIGH (ref 0.0–149.0)
VLDL: 69 mg/dL — ABNORMAL HIGH (ref 0.0–40.0)

## 2011-07-23 LAB — LDL CHOLESTEROL, DIRECT: Direct LDL: 103.5 mg/dL

## 2011-08-06 ENCOUNTER — Telehealth: Payer: Self-pay | Admitting: Family Medicine

## 2011-08-06 NOTE — Telephone Encounter (Signed)
Refill: Fenofibrate 160mg  tablets. Take 1 tablet by mouth every day. Qty 30. Last fill 07-08-11  Hydralazine 50mg  tablets (orange). Take 1 tablet by mouth three times daily. Qty 90. Last fill 07-08-11

## 2011-08-07 MED ORDER — FENOFIBRATE 160 MG PO TABS: 160.0000 mg | ORAL_TABLET | Freq: Every day | ORAL | Status: DC

## 2011-08-07 MED ORDER — HYDRALAZINE HCL 50 MG PO TABS: 50.0000 mg | ORAL_TABLET | Freq: Three times a day (TID) | ORAL | Status: DC

## 2011-08-07 MED ORDER — BUMETANIDE 1 MG PO TABS: 1.0000 mg | ORAL_TABLET | Freq: Two times a day (BID) | ORAL | Status: DC

## 2011-08-07 NOTE — Telephone Encounter (Signed)
Called pt to clarify that she also needs the bumitide to be refilled, sent all rx to [facility] on [address], pt is aware

## 2011-08-14 ENCOUNTER — Other Ambulatory Visit: Payer: Self-pay | Admitting: Family Medicine

## 2011-08-14 DIAGNOSIS — R7989 Other specified abnormal findings of blood chemistry: Secondary | ICD-10-CM

## 2011-08-17 ENCOUNTER — Other Ambulatory Visit: Payer: Medicare Other

## 2011-08-19 ENCOUNTER — Other Ambulatory Visit (INDEPENDENT_AMBULATORY_CARE_PROVIDER_SITE_OTHER): Payer: Medicare Other

## 2011-08-19 DIAGNOSIS — R799 Abnormal finding of blood chemistry, unspecified: Secondary | ICD-10-CM

## 2011-08-19 DIAGNOSIS — R7989 Other specified abnormal findings of blood chemistry: Secondary | ICD-10-CM

## 2011-08-19 LAB — BASIC METABOLIC PANEL
Calcium: 9.9 mg/dL (ref 8.4–10.5)
Creatinine, Ser: 1.5 mg/dL — ABNORMAL HIGH (ref 0.4–1.2)
GFR: 35.05 mL/min — ABNORMAL LOW (ref >60.00)

## 2011-08-19 NOTE — Progress Notes (Signed)
LABS ONLY  

## 2011-08-24 ENCOUNTER — Encounter: Payer: Self-pay | Admitting: *Deleted

## 2011-09-09 ENCOUNTER — Other Ambulatory Visit: Payer: Self-pay | Admitting: Family Medicine

## 2011-09-09 MED ORDER — CLONIDINE HCL 0.3 MG PO TABS: 0.3000 mg | ORAL_TABLET | Freq: Two times a day (BID) | ORAL | Status: DC

## 2011-09-09 NOTE — Telephone Encounter (Signed)
rx sent to pharmacy by e-script  

## 2011-09-09 NOTE — Telephone Encounter (Signed)
refill Clonidine 0.3MG  Tablets Take one tablet by mouth twice daily Qty 60 Last filled 04.10.13 Last ov 03.27.13

## 2011-10-01 ENCOUNTER — Telehealth: Payer: Self-pay | Admitting: Family Medicine

## 2011-10-01 MED ORDER — LOSARTAN POTASSIUM-HCTZ 100-12.5 MG PO TABS: 1.0000 | ORAL_TABLET | Freq: Every day | ORAL | Status: DC

## 2011-10-01 NOTE — Telephone Encounter (Signed)
Refill: Losartan/hctz 100/12.5mg  tablets. Take 1 tablet by mouth every day. Qty 30. Last fill 09-05-11

## 2011-10-01 NOTE — Telephone Encounter (Signed)
rx sent to pharmacy by e-script  

## 2011-10-21 ENCOUNTER — Ambulatory Visit: Payer: Medicare Other | Admitting: Family Medicine

## 2011-10-22 ENCOUNTER — Ambulatory Visit (INDEPENDENT_AMBULATORY_CARE_PROVIDER_SITE_OTHER): Payer: Medicare Other | Admitting: Family Medicine

## 2011-10-22 ENCOUNTER — Encounter: Payer: Self-pay | Admitting: Family Medicine

## 2011-10-22 VITALS — BP 125/82 | HR 52 | Temp 98.6°F | Ht 66.0 in | Wt 253.0 lb

## 2011-10-22 DIAGNOSIS — E119 Type 2 diabetes mellitus without complications: Secondary | ICD-10-CM

## 2011-10-22 NOTE — Patient Instructions (Addendum)
Schedule your complete physical for mid September We'll notify you of your lab results and make any changes if needed You look great!!  Keep up the good work! Have a great summer!!!

## 2011-10-22 NOTE — Progress Notes (Signed)
  Subjective:    Patient ID: Shelia Woods, female    DOB: [birth year], 78 y.o.   MRN: [id number]  HPI DM- chronic problem, reports CBGs have been labile.  CBG was 136 yesterday.  Dr Everardo All told pt that her goal is 100-150 b/c pt starts to have symptomatic lows near 100.  Taking 14 units R qAM, 19 units R q lunch, 30 units R q dinner and 30 units NPH at night.  No CP, SOB, HAs, visual changes (UTD on eye exam), edema.   Review of Systems For ROS see HPI     Objective:   Physical Exam  Vitals reviewed. Constitutional: She is oriented to person, place, and time. She appears well-developed and well-nourished. No distress.  HENT:  Head: Normocephalic and atraumatic.  Eyes: Conjunctivae and EOM are normal. Pupils are equal, round, and reactive to light.  Neck: Normal range of motion. Neck supple. No thyromegaly present.  Cardiovascular: Normal rate, regular rhythm, normal heart sounds and intact distal pulses.   No murmur heard. Pulmonary/Chest: Effort normal and breath sounds normal. No respiratory distress.  Abdominal: Soft. She exhibits no distension. There is no tenderness.  Musculoskeletal: She exhibits no edema.  Lymphadenopathy:    She has no cervical adenopathy.  Neurological: She is alert and oriented to person, place, and time.  Skin: Skin is warm and dry.  Psychiatric: She has a normal mood and affect. Her behavior is normal.          Assessment & Plan:

## 2011-10-23 LAB — BASIC METABOLIC PANEL
Calcium: 10.3 mg/dL (ref 8.4–10.5)
GFR: 34 mL/min — ABNORMAL LOW (ref >60.00)
Potassium: 3.8 mEq/L (ref 3.5–5.1)
Sodium: 138 mEq/L (ref 135–145)

## 2011-10-23 LAB — HEMOGLOBIN A1C: Hgb A1c MFr Bld: 7.5 % — ABNORMAL HIGH (ref 4.6–6.5)

## 2011-10-25 NOTE — Assessment & Plan Note (Signed)
Chronic problem.  Pt reports CBGs are 'good'.  Due for A1C.  Adjust meds prn.  UTD on eye exam.

## 2011-10-26 ENCOUNTER — Encounter: Payer: Self-pay | Admitting: *Deleted

## 2011-10-28 ENCOUNTER — Other Ambulatory Visit: Payer: Self-pay | Admitting: Family Medicine

## 2011-10-28 MED ORDER — HYDRALAZINE HCL 50 MG PO TABS: 50.0000 mg | ORAL_TABLET | Freq: Three times a day (TID) | ORAL | Status: DC

## 2011-10-28 NOTE — Telephone Encounter (Signed)
Hydralazine 50 mg tablets  Qty:90 Take one tablet by mouth three times daily Refill last: 09/30/11

## 2011-10-28 NOTE — Telephone Encounter (Signed)
rx sent to pharmacy by e-script  

## 2011-12-03 ENCOUNTER — Telehealth: Payer: Self-pay | Admitting: Family Medicine

## 2011-12-03 MED ORDER — BUMETANIDE 1 MG PO TABS: 1.0000 mg | ORAL_TABLET | Freq: Two times a day (BID) | ORAL | Status: DC

## 2011-12-03 MED ORDER — HYDRALAZINE HCL 50 MG PO TABS: 50.0000 mg | ORAL_TABLET | Freq: Three times a day (TID) | ORAL | Status: DC

## 2011-12-03 NOTE — Telephone Encounter (Signed)
Refill: Bumetanide 1mg  tablets. Take 1 tablet by mouth twice daily. Qty 60. Last fill 10-30-11 Hydralazine 50mg  tablets (orange). Take 1 tablet by mouth three times daily. Qty 90. Last fill 10-30-11

## 2011-12-03 NOTE — Telephone Encounter (Signed)
rx sent to pharmacy by e-script  

## 2011-12-10 ENCOUNTER — Telehealth: Payer: Self-pay | Admitting: Family Medicine

## 2011-12-10 MED ORDER — ATORVASTATIN CALCIUM 20 MG PO TABS: 20.0000 mg | ORAL_TABLET | Freq: Every day | ORAL | Status: DC

## 2011-12-10 NOTE — Telephone Encounter (Signed)
rx sent to pharmacy by e-script  

## 2011-12-10 NOTE — Telephone Encounter (Signed)
Refill: Atorvastatin 20mg  tablets. Take 1 tablet by mouth every night at bedtime. Qty 30. Last fill 11-12-11

## 2011-12-31 ENCOUNTER — Other Ambulatory Visit: Payer: Self-pay | Admitting: Family Medicine

## 2011-12-31 MED ORDER — HYDRALAZINE HCL 50 MG PO TABS: 50.0000 mg | ORAL_TABLET | Freq: Three times a day (TID) | ORAL | Status: DC

## 2011-12-31 NOTE — Telephone Encounter (Signed)
rx sent to pharmacy by e-script  

## 2011-12-31 NOTE — Telephone Encounter (Signed)
HYDRALAZINE 50 MG TABLETS (ORGANGE) QTY:90 LAST REFILL: 12/03/11 TAKE 1 TABLET BY MOUTH THREE TIMES DAILY

## 2011-12-31 NOTE — Telephone Encounter (Signed)
HYDRALAZINE 50 MG TABLETS QTY: 90 LAST REFILL: 12/03/11 TAKE ONE TABLET BY MOUTH THREE TIMES DAILY

## 2012-01-01 MED ORDER — HYDRALAZINE HCL 50 MG PO TABS: 50.0000 mg | ORAL_TABLET | Freq: Three times a day (TID) | ORAL | Status: DC

## 2012-01-01 NOTE — Telephone Encounter (Signed)
rx sent to pharmacy by e-script  

## 2012-01-07 ENCOUNTER — Telehealth: Payer: Self-pay | Admitting: Family Medicine

## 2012-01-07 MED ORDER — DILTIAZEM HCL ER BEADS 300 MG PO CP24: 300.0000 mg | ORAL_CAPSULE | Freq: Every day | ORAL | Status: DC

## 2012-01-07 NOTE — Telephone Encounter (Signed)
Refill: Taztia xt 300mg  capsules. Take 1 capsule by mouth daily. Qty 30. Last fill 12-09-11

## 2012-01-07 NOTE — Telephone Encounter (Signed)
rx sent to pharmacy by e-script Per verbal from MD Beverely Low

## 2012-01-13 ENCOUNTER — Encounter: Payer: Self-pay | Admitting: Family Medicine

## 2012-01-13 ENCOUNTER — Ambulatory Visit (INDEPENDENT_AMBULATORY_CARE_PROVIDER_SITE_OTHER): Payer: Medicare Other | Admitting: Family Medicine

## 2012-01-13 VITALS — BP 135/82 | HR 62 | Temp 98.2°F | Ht 66.0 in | Wt 249.0 lb

## 2012-01-13 DIAGNOSIS — E7800 Pure hypercholesterolemia, unspecified: Secondary | ICD-10-CM

## 2012-01-13 DIAGNOSIS — I10 Essential (primary) hypertension: Secondary | ICD-10-CM

## 2012-01-13 DIAGNOSIS — Z23 Encounter for immunization: Secondary | ICD-10-CM

## 2012-01-13 DIAGNOSIS — E119 Type 2 diabetes mellitus without complications: Secondary | ICD-10-CM

## 2012-01-13 DIAGNOSIS — Z0000 Encounter for general adult medical examination without abnormal findings: Secondary | ICD-10-CM

## 2012-01-13 LAB — HEPATIC FUNCTION PANEL
ALT: 20 U/L (ref 0–35)
AST: 18 U/L (ref 0–37)
Bilirubin, Direct: 0.1 mg/dL (ref 0.0–0.3)
Total Bilirubin: 0.6 mg/dL (ref 0.3–1.2)
Total Protein: 7.2 g/dL (ref 6.0–8.3)

## 2012-01-13 LAB — BASIC METABOLIC PANEL
BUN: 35 mg/dL — ABNORMAL HIGH (ref 6–23)
CO2: 26 mEq/L (ref 19–32)
Chloride: 103 mEq/L (ref 96–112)
Creatinine, Ser: 1.4 mg/dL — ABNORMAL HIGH (ref 0.4–1.2)
Potassium: 3.5 mEq/L (ref 3.5–5.1)

## 2012-01-13 LAB — LDL CHOLESTEROL, DIRECT: Direct LDL: 95.7 mg/dL

## 2012-01-13 LAB — CBC WITH DIFFERENTIAL/PLATELET
Basophils Absolute: 0 10*3/uL (ref 0.0–0.1)
Basophils Relative: 0.6 % (ref 0.0–3.0)
Hemoglobin: 11.9 g/dL — ABNORMAL LOW (ref 12.0–15.0)
Lymphocytes Relative: 17.1 % (ref 12.0–46.0)
Monocytes Relative: 9 % (ref 3.0–12.0)
Neutro Abs: 4.7 10*3/uL (ref 1.4–7.7)
Neutrophils Relative %: 71.8 % (ref 43.0–77.0)
RBC: 4.11 Mil/uL (ref 3.87–5.11)

## 2012-01-13 LAB — LIPID PANEL
Cholesterol: 179 mg/dL (ref 0–200)
Total CHOL/HDL Ratio: 5
VLDL: 57.2 mg/dL — ABNORMAL HIGH (ref 0.0–40.0)

## 2012-01-13 NOTE — Patient Instructions (Addendum)
Follow up in 3-4 months to recheck diabetes We'll notify you of your lab results and make any changes if needed Keep up the good work!  You look great! Call with any questions or concerns Happy Fall! 

## 2012-01-13 NOTE — Assessment & Plan Note (Signed)
Chronic problem.  Tolerating med w/out difficulty.  Check labs.  Adjust meds prn  

## 2012-01-13 NOTE — Assessment & Plan Note (Signed)
Chronic problem.  Well controlled today.  Asymptomatic.  No changes in current meds.

## 2012-01-13 NOTE — Progress Notes (Signed)
  Subjective:    Patient ID: Shelia Woods, female    DOB: 03-14-1931, 78 y.o.   MRN: [id number]  HPI Here today for CPE.  Risk Factors: DM- chronic problem, on insulin.  No longer seeing Dr Everardo All.  UTD on eye exam- due next month.  Denies symptomatic lows- no shaky or dizzy feeling.  No N/V/D.  + neuropathy of feet.  Does not see podiatry. HTN- chronic problem, on Losartan HCTZ, Dilt, Clonidine, Hydralazine.  No CP, SOB, HAs, visual changes, edema Hyperlipidemia- chronic problem, on Lipitor, fenofibrate.  No N/V/D Physical Activity: very little activity, typically uses walker or wheechair Fall Risk: high risk due to limited mobility Depression: no concerns about depression Hearing: normal to conversational and whispered tones ADL's: independent Cognitive:  Normal linear thought process, memory and attention intact Home Safety:  Feels safe at home, lives w/ husband Height, Weight, BMI, Visual Acuity: see vitals, vision corrected to 20/20 w/ glasses Counseling: not interested in mammo or DEXA, not interested in colonoscopy. Labs Ordered: See A&P Care Plan: See A&P    Review of Systems Patient reports no vision/ hearing changes, adenopathy,fever, weight change,  persistant/recurrent hoarseness , swallowing issues, chest pain, palpitations, edema, persistant/recurrent cough, hemoptysis, dyspnea (rest/exertional/paroxysmal nocturnal), gastrointestinal bleeding (melena, rectal bleeding), abdominal pain, significant heartburn, bowel changes, GU symptoms (dysuria, hematuria, incontinence), Gyn symptoms (abnormal  bleeding, pain),  syncope, memory loss, skin/hair/nail changes, abnormal bruising or bleeding, anxiety, or depression.     Objective:   Physical Exam General Appearance:    Alert, cooperative, no distress, appears stated age  Head:    Normocephalic, without obvious abnormality, atraumatic  Eyes:    PERRL, conjunctiva/corneas clear, EOM's intact, fundi    benign, both eyes  Ears:     Normal TM's and external ear canals, both ears  Nose:   Nares normal, septum midline, mucosa normal, no drainage    or sinus tenderness  Throat:   Lips, mucosa, and tongue normal; teeth and gums normal  Neck:   Supple, symmetrical, trachea midline, no adenopathy;    Thyroid: no enlargement/tenderness/nodules  Back:     Symmetric, no curvature, ROM normal, no CVA tenderness  Lungs:     Clear to auscultation bilaterally, respirations unlabored  Chest Wall:    No tenderness or deformity   Heart:    Regular rate and rhythm, S1 and S2 normal, no murmur, rub   or gallop  Breast Exam:    Deferred at pt request  Abdomen:     Soft, non-tender, bowel sounds active all four quadrants,    no masses, no organomegaly  Genitalia:    Deferred at pt request  Rectal:    Extremities:   Extremities normal, atraumatic, no cyanosis or edema  Pulses:   2+ and symmetric all extremities  Skin:   Skin color, texture, turgor normal, no rashes or lesions  Lymph nodes:   Cervical, supraclavicular, and axillary nodes normal  Neurologic:   CNII-XII intact, normal strength, sensation and reflexes    throughout          Assessment & Plan:

## 2012-01-13 NOTE — Assessment & Plan Note (Signed)
Pt's PE WNL.  Refusing all health maintenance at this time.  Check labs.  Anticipatory guidance provided.

## 2012-01-13 NOTE — Assessment & Plan Note (Signed)
Chronic problem.  No longer following w/ Endo due to distance to office.  UTD on eye exam.  Asymptomatic.  Check labs.  Adjust meds prn

## 2012-01-28 ENCOUNTER — Telehealth: Payer: Self-pay | Admitting: Family Medicine

## 2012-01-28 MED ORDER — FENOFIBRATE 160 MG PO TABS: 160.0000 mg | ORAL_TABLET | Freq: Every day | ORAL | Status: DC

## 2012-01-28 MED ORDER — LOSARTAN POTASSIUM-HCTZ 100-12.5 MG PO TABS: 1.0000 | ORAL_TABLET | Freq: Every day | ORAL | Status: DC

## 2012-01-28 NOTE — Telephone Encounter (Signed)
Refill: Losartan/hctz 100/12.5mg  tablets. Take 1 tablet by mouth once daily. Qty 30. Last fill 9.5.13  Refill: Fenofibrate 160mg  tablets. Take 1 tablet by mouth every day. Qty 30. Last fill 12-31-11

## 2012-01-28 NOTE — Telephone Encounter (Signed)
Rx sent.    MW 

## 2012-03-31 ENCOUNTER — Other Ambulatory Visit: Payer: Self-pay | Admitting: Family Medicine

## 2012-03-31 MED ORDER — CLONIDINE HCL 0.3 MG PO TABS: 0.3000 mg | ORAL_TABLET | Freq: Two times a day (BID) | ORAL | Status: DC

## 2012-03-31 MED ORDER — BUMETANIDE 1 MG PO TABS: 1.0000 mg | ORAL_TABLET | Freq: Two times a day (BID) | ORAL | Status: DC

## 2012-03-31 NOTE — Telephone Encounter (Signed)
Rx sent 

## 2012-03-31 NOTE — Telephone Encounter (Signed)
refills x 2  Clonidine & Bumetanide last ov wt/labs 9.18.13 V70  1-Clonidine 0.3mg  tablets #60 last fill 11.6.13--take 1 tablet by mouth twice daily  2-Bumetanide 1MG  tablets #60 last fill 11.7.13--take 1 tablet by mouth twice daily

## 2012-04-08 ENCOUNTER — Telehealth: Payer: Self-pay | Admitting: Family Medicine

## 2012-04-08 MED ORDER — ATORVASTATIN CALCIUM 20 MG PO TABS: 20.0000 mg | ORAL_TABLET | Freq: Every day | ORAL | Status: DC

## 2012-04-08 NOTE — Telephone Encounter (Signed)
Refill: Atorvastatin 20 mg tablets. Take 1 tablet by mouth every night at bedtime. Qty 30. Last fill 03-09-12

## 2012-04-08 NOTE — Telephone Encounter (Signed)
Request for rx to be RF'ed is sent.//AB/CMA

## 2012-05-03 ENCOUNTER — Telehealth: Payer: Self-pay | Admitting: Family Medicine

## 2012-05-03 NOTE — Telephone Encounter (Signed)
Refill: Bumetanide 1 mg tablet. Take 1 tablet by mouth twice daily. Qty 60. Last fill 03-31-12

## 2012-05-04 NOTE — Telephone Encounter (Signed)
Ok for #60, 3 refills 

## 2012-05-04 NOTE — Telephone Encounter (Signed)
Please advise on RF request.  Last OV and labs:01-13-12.

## 2012-05-05 MED ORDER — BUMETANIDE 1 MG PO TABS: 1.0000 mg | ORAL_TABLET | Freq: Two times a day (BID) | ORAL | Status: DC

## 2012-05-05 MED ORDER — CLONIDINE HCL 0.3 MG PO TABS: 0.3000 mg | ORAL_TABLET | Freq: Two times a day (BID) | ORAL | Status: DC

## 2012-05-05 NOTE — Telephone Encounter (Signed)
Please advise on RF request for Clonidine 0.3mg .  Last OV and labs 01-13-12.//AB/CMA   Rx for Bumetanide 1mg  sent to the pharmacy by e-script.//AB/CMA

## 2012-05-05 NOTE — Telephone Encounter (Signed)
Ok for 1 month, 6 refills

## 2012-05-05 NOTE — Telephone Encounter (Signed)
Refill: Clonidine 0.3mg  tablets. Take 1 tablet by mouth twice daily. Qty 60. Last fill 03-31-12

## 2012-05-05 NOTE — Telephone Encounter (Signed)
Rx for the Clonidine 0.3mg  sent to the pharmacy by e-script.//AB/CMA

## 2012-05-16 ENCOUNTER — Ambulatory Visit: Payer: Medicare Other | Admitting: Family Medicine

## 2012-05-18 ENCOUNTER — Ambulatory Visit: Payer: Medicare Other | Admitting: Family Medicine

## 2012-05-24 ENCOUNTER — Other Ambulatory Visit: Payer: Self-pay | Admitting: Family Medicine

## 2012-05-24 NOTE — Telephone Encounter (Signed)
Please advise on RF request.  Last OV:01-13-12.//AB/CMA

## 2012-05-24 NOTE — Telephone Encounter (Signed)
REFILL Ondansetron HCl (Tab) 8 MG Take 1 tablet (8 mg total) by mouth every 8 (eight) hours as needed. For nausea #20 LAST FILL 12.18.12

## 2012-05-25 MED ORDER — ONDANSETRON HCL 8 MG PO TABS: 8.0000 mg | ORAL_TABLET | Freq: Three times a day (TID) | ORAL | Status: DC | PRN

## 2012-05-25 NOTE — Telephone Encounter (Signed)
Ok for #20, no refills 

## 2012-05-25 NOTE — Telephone Encounter (Signed)
Rx sent to the pharmacy by e-script.//AB/CMA 

## 2012-06-01 ENCOUNTER — Encounter: Payer: Self-pay | Admitting: Family Medicine

## 2012-06-01 ENCOUNTER — Ambulatory Visit (INDEPENDENT_AMBULATORY_CARE_PROVIDER_SITE_OTHER): Payer: Medicare Other | Admitting: Family Medicine

## 2012-06-01 VITALS — BP 120/70 | HR 54 | Temp 98.0°F | Wt 243.0 lb

## 2012-06-01 DIAGNOSIS — E119 Type 2 diabetes mellitus without complications: Secondary | ICD-10-CM

## 2012-06-01 DIAGNOSIS — E7800 Pure hypercholesterolemia, unspecified: Secondary | ICD-10-CM

## 2012-06-01 LAB — BASIC METABOLIC PANEL
BUN: 47 mg/dL — ABNORMAL HIGH (ref 6–23)
GFR: 33.21 mL/min — ABNORMAL LOW (ref >60.00)
Potassium: 3.5 mEq/L (ref 3.5–5.1)

## 2012-06-01 LAB — LIPID PANEL
Cholesterol: 169 mg/dL (ref 0–200)
Total CHOL/HDL Ratio: 4
Triglycerides: 269 mg/dL — ABNORMAL HIGH (ref 0.0–149.0)
VLDL: 53.8 mg/dL — ABNORMAL HIGH (ref 0.0–40.0)

## 2012-06-01 LAB — HEMOGLOBIN A1C: Hgb A1c MFr Bld: 6.6 % — ABNORMAL HIGH (ref 4.6–6.5)

## 2012-06-01 LAB — HEPATIC FUNCTION PANEL
AST: 20 U/L (ref 0–37)
Bilirubin, Direct: 0.1 mg/dL (ref 0.0–0.3)
Total Bilirubin: 0.8 mg/dL (ref 0.3–1.2)

## 2012-06-01 NOTE — Assessment & Plan Note (Signed)
Chronic problem, added fenofibrate at last visit.  Due to recheck labs and LFTs.  Adjust meds prn.

## 2012-06-01 NOTE — Assessment & Plan Note (Signed)
Chronic problem.  Pt's CBGs well controlled by pt report.  Med compliance excellent.  UTD on eye exam.  Foot exam done today w/ diminished sensation on L.  Check labs, adjust meds prn.

## 2012-06-01 NOTE — Progress Notes (Signed)
  Subjective:    Patient ID: Shelia Woods, female    DOB: [birth year], 79 y.o.   MRN: [id number]  HPI DM- chronic problem, on regular insulin TID 04-10-29, on NPH (N) 30 units QHS.  Rare symptomatic lows.  No N/V/D, CP, SOB, HAs, visual changes, edema.  UTD on eye exam ([place]).  + numbness/tingling of feet unless wearing compression hose.  No sores on feet.  Hyperlipidemia- chronic problem, fenofibrate was added to lipitor at last visit due to increased trigs.  No abd pain, N/V, myalgias.   Review of Systems For ROS see HPI     Objective:   Physical Exam  Vitals reviewed. Constitutional: She is oriented to person, place, and time. She appears well-developed and well-nourished. No distress.  HENT:  Head: Normocephalic and atraumatic.  Neck: Normal range of motion. Neck supple.  Cardiovascular: Normal rate, normal heart sounds and intact distal pulses.   Pulmonary/Chest: Effort normal and breath sounds normal. No respiratory distress. She has no wheezes. She has no rales.  Musculoskeletal: She exhibits edema (nonpitting bilaterally).  Lymphadenopathy:    She has no cervical adenopathy.  Neurological: She is alert and oriented to person, place, and time.  Skin: Skin is warm and dry.          Assessment & Plan:

## 2012-06-01 NOTE — Patient Instructions (Addendum)
Follow up in 3-4 months to recheck sugars and cholesterol We'll notify you of your lab results and make any changes if needed Keep up the good work!  You're doing great! Call with any questions or concerns Happy Early Birthday!!!

## 2012-06-02 ENCOUNTER — Other Ambulatory Visit: Payer: Self-pay | Admitting: Family Medicine

## 2012-06-02 NOTE — Telephone Encounter (Signed)
Refills x 2  1-Hydralazine 50MG  tablets ([place]) #90, Take 1 tablet by mouth three times daily last fill 1.7.14  2-Losartan/HCTZ 100/12.5MG  tablets #30, take 1 tablet by mouth daily last fill 1.7.14

## 2012-06-03 MED ORDER — HYDRALAZINE HCL 50 MG PO TABS: 50.0000 mg | ORAL_TABLET | Freq: Three times a day (TID) | ORAL | Status: DC

## 2012-06-03 MED ORDER — LOSARTAN POTASSIUM-HCTZ 100-12.5 MG PO TABS: 1.0000 | ORAL_TABLET | Freq: Every day | ORAL | Status: DC

## 2012-06-03 NOTE — Telephone Encounter (Signed)
Rx sent to the pharmacy by e-script.//AB/CMA 

## 2012-07-21 ENCOUNTER — Other Ambulatory Visit: Payer: Self-pay | Admitting: Family Medicine

## 2012-07-25 ENCOUNTER — Telehealth: Payer: Self-pay | Admitting: Family Medicine

## 2012-07-25 NOTE — Telephone Encounter (Signed)
Refill- fenofibrate 160 mg tablets. Take one tablet by mouth daily. Qty 30 last fill 3.3.14

## 2012-07-26 MED ORDER — FENOFIBRATE 160 MG PO TABS: 160.0000 mg | ORAL_TABLET | Freq: Every day | ORAL | Status: DC

## 2012-07-26 NOTE — Telephone Encounter (Signed)
LM @ (4:22pm) asking the pt to RTC.//AB/CMA

## 2012-07-28 NOTE — Telephone Encounter (Signed)
Pt returned your call.  

## 2012-07-28 NOTE — Telephone Encounter (Signed)
2nd request from pharmacy for fenofibrate 160 mg tabs,

## 2012-07-29 MED ORDER — FENOFIBRATE 160 MG PO TABS: 160.0000 mg | ORAL_TABLET | Freq: Every day | ORAL | Status: DC

## 2012-07-29 NOTE — Telephone Encounter (Signed)
Resent rx to pharmacy.

## 2012-08-17 ENCOUNTER — Other Ambulatory Visit: Payer: Self-pay | Admitting: Family Medicine

## 2012-08-17 NOTE — Telephone Encounter (Signed)
Med filled.  

## 2012-08-24 ENCOUNTER — Other Ambulatory Visit: Payer: Self-pay | Admitting: General Practice

## 2012-08-24 MED ORDER — BUMETANIDE 1 MG PO TABS: 1.0000 mg | ORAL_TABLET | Freq: Two times a day (BID) | ORAL | Status: DC

## 2012-08-24 NOTE — Telephone Encounter (Signed)
Med filled.  

## 2012-08-25 ENCOUNTER — Other Ambulatory Visit: Payer: Self-pay | Admitting: General Practice

## 2012-08-29 ENCOUNTER — Telehealth: Payer: Self-pay | Admitting: Family Medicine

## 2012-08-29 NOTE — Telephone Encounter (Signed)
Lm @ (4:34pm) asking the pt to RTC.//AB/CMA

## 2012-08-29 NOTE — Telephone Encounter (Signed)
Pt came in and stated that her pharmacy was wrong. Her pharmacy is on [address] and [facility]. Pt also stated that her insulin is only to [facility] to [facility] [address]. Thanks.

## 2012-08-31 NOTE — Telephone Encounter (Signed)
Pt states that she was referring to all meds and they need to go the [facility] except [facility]. Pt states that the last time she got her bumetanide it was sent to the wrong pharmacy and she had to get it transfer so she like for [place] to note on her account the specific pharmacy for her meds so that this will not occur again. Pt advised will update pharmacy in chart and place note on chart and let Dr Beverely Low assistance know as well. Pt Ok

## 2012-09-21 ENCOUNTER — Other Ambulatory Visit: Payer: Self-pay | Admitting: Family Medicine

## 2012-09-22 NOTE — Telephone Encounter (Signed)
Med filled.  

## 2012-10-05 ENCOUNTER — Ambulatory Visit: Payer: Medicare Other | Admitting: Family Medicine

## 2012-10-06 ENCOUNTER — Ambulatory Visit (INDEPENDENT_AMBULATORY_CARE_PROVIDER_SITE_OTHER): Payer: Medicare Other | Admitting: Family Medicine

## 2012-10-06 ENCOUNTER — Encounter: Payer: Self-pay | Admitting: Family Medicine

## 2012-10-06 VITALS — BP 180/50 | HR 82 | Temp 98.2°F | Wt 238.8 lb

## 2012-10-06 DIAGNOSIS — E1149 Type 2 diabetes mellitus with other diabetic neurological complication: Secondary | ICD-10-CM

## 2012-10-06 DIAGNOSIS — E1142 Type 2 diabetes mellitus with diabetic polyneuropathy: Secondary | ICD-10-CM

## 2012-10-06 DIAGNOSIS — E1140 Type 2 diabetes mellitus with diabetic neuropathy, unspecified: Secondary | ICD-10-CM

## 2012-10-06 DIAGNOSIS — I10 Essential (primary) hypertension: Secondary | ICD-10-CM

## 2012-10-06 DIAGNOSIS — B372 Candidiasis of skin and nail: Secondary | ICD-10-CM

## 2012-10-06 LAB — BASIC METABOLIC PANEL
BUN: 42 mg/dL — ABNORMAL HIGH (ref 6–23)
Chloride: 104 mEq/L (ref 96–112)
GFR: 37.2 mL/min — ABNORMAL LOW (ref >60.00)
Glucose, Bld: 166 mg/dL — ABNORMAL HIGH (ref 70–99)
Potassium: 3.6 mEq/L (ref 3.5–5.1)
Sodium: 140 mEq/L (ref 135–145)

## 2012-10-06 MED ORDER — NYSTATIN-TRIAMCINOLONE 100000-0.1 UNIT/GM-% EX OINT: 1.0000 "application " | TOPICAL_OINTMENT | Freq: Two times a day (BID) | CUTANEOUS | Status: DC

## 2012-10-06 NOTE — Patient Instructions (Addendum)
Schedule your complete physical in 3-4 months We'll notify you of your lab results and make any changes if needed Keep up the good work!  You look great! Call with any questions or concerns Have a good summer!!!

## 2012-10-06 NOTE — Progress Notes (Signed)
  Subjective:    Patient ID: Shelia Woods, female    DOB: [birth year], 79 y.o.   MRN: [id number]  HPI DM-chronic problem, on Novolin R and N- R dose is 15, 20,30 and N is 30 QHS.  CBGs 'doing good' per pt report, 100-150.  Denies symptomatic lows.  No CP, SOB above baseline, HAs, visual changes, edema.  UTD on eye exam (Shelia Woods)  HTN- chronic problem, on Bumex, Dilt, Hydralazine, Hyzaar.  Asymptomatic.  Cutaneous yeast- pt has been using Mycolog cream but it is sticking to underwear and causing skin to bleed when she peels underpants away.  Would prefer ointment to serve as barrier.  Review of Systems For ROS see HPI     Objective:   Physical Exam  Vitals reviewed. Constitutional: She is oriented to person, place, and time. She appears well-developed and well-nourished. No distress.  HENT:  Head: Normocephalic and atraumatic.  Eyes: Conjunctivae and EOM are normal. Pupils are equal, round, and reactive to light.  Neck: Normal range of motion. Neck supple. No thyromegaly present.  Cardiovascular: Normal rate, regular rhythm and intact distal pulses.   Murmur (I-II/VI SEM) heard. Pulmonary/Chest: Effort normal and breath sounds normal. No respiratory distress.  Abdominal: Soft. She exhibits no distension. There is no tenderness.  Musculoskeletal: She exhibits edema (trace edema bilaterally).  Lymphadenopathy:    She has no cervical adenopathy.  Neurological: She is alert and oriented to person, place, and time.  Skin: Skin is warm and dry.  Psychiatric: She has a normal mood and affect. Her behavior is normal.          Assessment & Plan:

## 2012-10-06 NOTE — Assessment & Plan Note (Signed)
Chronic problem.  Pt doing well on insulin.  UTD on eye exam.  Asymptomatic.  Check labs.  Adjust meds prn

## 2012-10-06 NOTE — Assessment & Plan Note (Signed)
Pts BP was very elevated at time of OV.  Pt flustered and has difficulty w/ ambulation from parking lot to exam room.  Asymptomatic.  Plan was to recheck BP prior to leaving but pt left before this occurred.  Will follow closely.

## 2012-10-06 NOTE — Assessment & Plan Note (Signed)
Ongoing problem.  Pt refused skin exam today.  Switch to Mycolog Ointment.  If no improvement, will refer to derm.  Pt expressed understanding and is in agreement w/ plan.

## 2012-10-18 ENCOUNTER — Other Ambulatory Visit: Payer: Self-pay | Admitting: Family Medicine

## 2012-10-21 ENCOUNTER — Other Ambulatory Visit: Payer: Self-pay | Admitting: Family Medicine

## 2012-12-16 ENCOUNTER — Other Ambulatory Visit: Payer: Self-pay | Admitting: Family Medicine

## 2012-12-20 ENCOUNTER — Other Ambulatory Visit: Payer: Self-pay | Admitting: *Deleted

## 2012-12-20 MED ORDER — DILTIAZEM HCL ER BEADS 300 MG PO CP24: 300.0000 mg | ORAL_CAPSULE | Freq: Every day | ORAL | Status: DC

## 2012-12-20 NOTE — Telephone Encounter (Signed)
Rx was refilled for Shelia Woods.  Ag cma

## 2013-01-16 ENCOUNTER — Other Ambulatory Visit: Payer: Self-pay | Admitting: Family Medicine

## 2013-01-17 NOTE — Telephone Encounter (Signed)
[facility]. SW, CMA 

## 2013-01-25 ENCOUNTER — Other Ambulatory Visit: Payer: Self-pay | Admitting: Family Medicine

## 2013-01-26 ENCOUNTER — Other Ambulatory Visit: Payer: Self-pay | Admitting: Family Medicine

## 2013-01-26 NOTE — Telephone Encounter (Signed)
Med filled.  

## 2013-01-26 NOTE — Telephone Encounter (Signed)
Med filled. Pt has an appt with tabori on Oct 9th

## 2013-02-01 ENCOUNTER — Telehealth: Payer: Self-pay

## 2013-02-01 NOTE — Telephone Encounter (Signed)
Medication and allergies: done  Pharmacy updated, uses [facility] [address] for 90 day supply Pharmacy updated, uses Therapist, occupational for local pharmacy Patient pruchases insulin and supplies from [facility] (neighborhood [place])  HM UTD:. Patient states not interested in updating any HM items Immunizations due: admin flu vaccine upon arrival  A/P: PAP: Unknown  Last: MMG: Unknown Last: Dexa: Never Last: CCS: Never DM: due HTN: due  Lipids: due  To Discuss with Provider: Patient had requested several weeks ago to referral to derm. (with assistant) Not in system. Has "spot" on right leg.  Patient would like refills for year due to inability to get transportation to pharmacy (suggested 90 day Rx-agrees) States that she has also been having issues getting refills. States she was only getting 1 with no refills. (advised it was due to time for OV)

## 2013-02-02 ENCOUNTER — Ambulatory Visit (INDEPENDENT_AMBULATORY_CARE_PROVIDER_SITE_OTHER): Payer: Medicare Other | Admitting: Family Medicine

## 2013-02-02 ENCOUNTER — Other Ambulatory Visit: Payer: Self-pay | Admitting: General Practice

## 2013-02-02 ENCOUNTER — Encounter: Payer: Self-pay | Admitting: Family Medicine

## 2013-02-02 VITALS — BP 150/60 | HR 68 | Temp 98.2°F | Resp 17 | Ht 66.0 in | Wt 242.4 lb

## 2013-02-02 DIAGNOSIS — S41102A Unspecified open wound of left upper arm, initial encounter: Secondary | ICD-10-CM

## 2013-02-02 DIAGNOSIS — E1140 Type 2 diabetes mellitus with diabetic neuropathy, unspecified: Secondary | ICD-10-CM

## 2013-02-02 DIAGNOSIS — Z23 Encounter for immunization: Secondary | ICD-10-CM

## 2013-02-02 DIAGNOSIS — E1142 Type 2 diabetes mellitus with diabetic polyneuropathy: Secondary | ICD-10-CM

## 2013-02-02 DIAGNOSIS — I10 Essential (primary) hypertension: Secondary | ICD-10-CM

## 2013-02-02 DIAGNOSIS — E7800 Pure hypercholesterolemia, unspecified: Secondary | ICD-10-CM

## 2013-02-02 DIAGNOSIS — E1149 Type 2 diabetes mellitus with other diabetic neurological complication: Secondary | ICD-10-CM

## 2013-02-02 DIAGNOSIS — I4891 Unspecified atrial fibrillation: Secondary | ICD-10-CM

## 2013-02-02 DIAGNOSIS — Z0000 Encounter for general adult medical examination without abnormal findings: Secondary | ICD-10-CM

## 2013-02-02 LAB — BASIC METABOLIC PANEL
BUN: 34 mg/dL — ABNORMAL HIGH (ref 6–23)
CO2: 25 mEq/L (ref 19–32)
Chloride: 104 mEq/L (ref 96–112)
Creatinine, Ser: 1.5 mg/dL — ABNORMAL HIGH (ref 0.4–1.2)
Glucose, Bld: 178 mg/dL — ABNORMAL HIGH (ref 70–99)
Potassium: 3.5 mEq/L (ref 3.5–5.1)
Sodium: 141 mEq/L (ref 135–145)

## 2013-02-02 LAB — CBC WITH DIFFERENTIAL/PLATELET
Basophils Relative: 0.6 % (ref 0.0–3.0)
Eosinophils Relative: 2.2 % (ref 0.0–5.0)
Hemoglobin: 12.2 g/dL (ref 12.0–15.0)
Lymphocytes Relative: 18.8 % (ref 12.0–46.0)
MCHC: 33.7 g/dL (ref 30.0–36.0)
MCV: 85.7 fl (ref 78.0–100.0)
Monocytes Absolute: 0.6 10*3/uL (ref 0.1–1.0)
Monocytes Relative: 7.9 % (ref 3.0–12.0)
Neutro Abs: 5.1 10*3/uL (ref 1.4–7.7)
Neutrophils Relative %: 70.5 % (ref 43.0–77.0)
Platelets: 349 10*3/uL (ref 150.0–400.0)
RBC: 4.23 Mil/uL (ref 3.87–5.11)
WBC: 7.3 10*3/uL (ref 4.5–10.5)

## 2013-02-02 LAB — LIPID PANEL
HDL: 40.4 mg/dL (ref >39.00)
Total CHOL/HDL Ratio: 5

## 2013-02-02 LAB — TSH: TSH: 2.45 u[IU]/mL (ref 0.35–5.50)

## 2013-02-02 LAB — HEPATIC FUNCTION PANEL
ALT: 15 U/L (ref 0–35)
AST: 18 U/L (ref 0–37)
Albumin: 4.1 g/dL (ref 3.5–5.2)
Alkaline Phosphatase: 35 U/L — ABNORMAL LOW (ref 39–117)
Total Bilirubin: 0.6 mg/dL (ref 0.3–1.2)
Total Protein: 7.6 g/dL (ref 6.0–8.3)

## 2013-02-02 LAB — LDL CHOLESTEROL, DIRECT: Direct LDL: 104.7 mg/dL

## 2013-02-02 MED ORDER — DILTIAZEM HCL ER BEADS 300 MG PO CP24: 300.0000 mg | ORAL_CAPSULE | Freq: Every day | ORAL | Status: DC

## 2013-02-02 MED ORDER — ONETOUCH ULTRASOFT LANCETS MISC: Status: DC

## 2013-02-02 MED ORDER — GLUCOSE BLOOD VI STRP: ORAL_STRIP | Status: DC

## 2013-02-02 MED ORDER — LOSARTAN POTASSIUM-HCTZ 100-12.5 MG PO TABS: ORAL_TABLET | ORAL | Status: DC

## 2013-02-02 MED ORDER — ATORVASTATIN CALCIUM 20 MG PO TABS: ORAL_TABLET | ORAL | Status: DC

## 2013-02-02 MED ORDER — BUMETANIDE 1 MG PO TABS: ORAL_TABLET | ORAL | Status: DC

## 2013-02-02 MED ORDER — INSULIN NPH (HUMAN) (ISOPHANE) 100 UNIT/ML ~~LOC~~ SUSP
SUBCUTANEOUS | Status: DC
Start: 2013-02-02 — End: 2013-03-15

## 2013-02-02 MED ORDER — "INSULIN SYRINGE-NEEDLE U-100 31G X 3/8"" 0.3 ML MISC": Status: DC

## 2013-02-02 MED ORDER — FENOFIBRATE 160 MG PO TABS: ORAL_TABLET | ORAL | Status: DC

## 2013-02-02 MED ORDER — CLONIDINE HCL 0.3 MG PO TABS: ORAL_TABLET | ORAL | Status: DC

## 2013-02-02 NOTE — Assessment & Plan Note (Signed)
Chronic problem for pt.  Had recent episode.  Appears to be in sinus rhythm.  Has not seen cards recently due to access issues and Dr Vern Claude retirement.  Will refer to Cards at [facility] for continued care but closer location.  Pt expressed understanding and is in agreement w/ plan.

## 2013-02-02 NOTE — Assessment & Plan Note (Signed)
Deteriorated when compared to previous but not bad for pt's difficult to control BP.  Is virtually maxed out on BP meds.  Will continue to follow.

## 2013-02-02 NOTE — Patient Instructions (Addendum)
Follow up in 6 months to recheck diabetes and cholesterol We'll call you with your cardiology appt We'll notify you of your lab results and make any changes if needed Call with any questions or concerns Happy Fall!!

## 2013-02-02 NOTE — Assessment & Plan Note (Signed)
Pt refusing health maintenance- mammo, DEXA, colonoscopy.  Check labs.  Adjust med prn.

## 2013-02-02 NOTE — Progress Notes (Signed)
  Subjective:    Patient ID: Shelia Woods, female    DOB: 1930/09/27, 79 y.o.   MRN: [id number]  HPI Here today for CPE.  Risk Factors: DM- chronic problem, UTD on eye exam (seeing regularly q6 months).  Taking Novolin R 15-20-30, Novolin N 30 at QHS.  Rare symptomatic lows.  + neuropathy of feet.  No N/V/D.  No HAs, visual changes. HTN- chronic problem, on Bumex, clonidine, Diltiazem, Hydralazine, Losartan HCTZ.  Has had some chest pressure and recurrent Afib (previously seeing cardiology- Dr Daleen Squibb retired).  Chronic SOB. Hyperlipidemia- chronic problem, tolerating lipitor w/out difficulty.  Physical Activity: very limited due to arthritis Fall Risk: moderate due to inactivity and walker use Depression: no current sxs Hearing: normal to conversational tones, mildly decreased to whispered voice ADL's: independent Cognitive: normal linear thought process, memory and attention intact Home Safety: safe at home Height, Weight, BMI, Visual Acuity: see vitals, vision corrected to 20/20 w/ glasses Counseling: pt declines mammo, DEXA, colonoscopy.  No need for pap smears. Labs Ordered: See A&P Care Plan: See A&P    Review of Systems Patient reports no vision/ hearing changes, adenopathy,fever, weight change,  persistant/recurrent hoarseness , swallowing issues, edema, persistant/recurrent cough, hemoptysis, dyspnea (rest/exertional/paroxysmal nocturnal), gastrointestinal bleeding (melena, rectal bleeding), abdominal pain, significant heartburn, bowel changes, GU symptoms (dysuria, hematuria, incontinence), Gyn symptoms (abnormal  bleeding, pain),  syncope, focal weakness, memory loss, skin/hair/nail changes, abnormal bruising or bleeding, anxiety, or depression.     Objective:   Physical Exam General Appearance:    Alert, cooperative, no distress, appears stated age  Head:    Normocephalic, without obvious abnormality, atraumatic  Eyes:    PERRL, conjunctiva/corneas clear, EOM's intact, both  eyes  Ears:    Normal TM's and external ear canals, both ears  Nose:   Nares normal, septum midline, mucosa normal, no drainage    or sinus tenderness  Throat:   Lips, mucosa, and tongue normal; teeth and gums normal  Neck:   Supple, symmetrical, trachea midline, no adenopathy;    Thyroid: no enlargement/tenderness/nodules  Back:     Symmetric, no curvature, ROM normal, no CVA tenderness  Lungs:     Clear to auscultation bilaterally, respirations unlabored  Chest Wall:    No tenderness or deformity   Heart:    Regular rate and rhythm, S1 and S2 normal, no murmur, rub   or gallop  Breast Exam:    Deferred  Abdomen:     Soft, non-tender, bowel sounds active all four quadrants,    no masses, no organomegaly  Genitalia:    Deferred  Rectal:    Extremities:   Extremities normal, atraumatic, no cyanosis or edema  Pulses:   2+ and symmetric all extremities  Skin:   Skin color, texture, turgor normal, no rashes or lesions  Lymph nodes:   Cervical, supraclavicular, and axillary nodes normal  Neurologic:   CNII-XII intact, normal strength, sensation and reflexes    throughout          Assessment & Plan:

## 2013-02-02 NOTE — Assessment & Plan Note (Signed)
Chronic problem.  Tolerating statin and fenofibrate w/out difficulty.  Check labs.  Adjust meds prn

## 2013-02-02 NOTE — Assessment & Plan Note (Signed)
Chronic problem.  UTD on eye exam.  Decreased sensation in L foot on monofilament exam.  Check labs.  Adjust meds prn

## 2013-02-13 ENCOUNTER — Other Ambulatory Visit: Payer: Self-pay | Admitting: Family Medicine

## 2013-02-14 NOTE — Telephone Encounter (Signed)
Med filled.  

## 2013-02-22 ENCOUNTER — Other Ambulatory Visit: Payer: Self-pay | Admitting: Family Medicine

## 2013-02-22 NOTE — Telephone Encounter (Signed)
Med filled.  

## 2013-03-14 ENCOUNTER — Telehealth: Payer: Self-pay | Admitting: Family Medicine

## 2013-03-14 NOTE — Telephone Encounter (Signed)
Novolin N- 30 units QHS Novolin R- 15 units QAM, 20 units qlunch, 30 units QPM

## 2013-03-14 NOTE — Telephone Encounter (Signed)
Please advise the correct dosage pt is suppose to be on?

## 2013-03-14 NOTE — Telephone Encounter (Signed)
Patient states that she is almost out of her insulin at her [address] pharmacy on [address]. Patient says that they are not allowing her to have it filled because they are telling her it is too early. Patient states that the rx they have does not reflect the dosage increase of her now using it 3x per day. Patient asks that we sed new rx to [facility] reflecting this change. Please advise.

## 2013-03-15 ENCOUNTER — Ambulatory Visit (INDEPENDENT_AMBULATORY_CARE_PROVIDER_SITE_OTHER): Payer: Medicare Other | Admitting: Cardiology

## 2013-03-15 ENCOUNTER — Encounter: Payer: Self-pay | Admitting: Cardiology

## 2013-03-15 VITALS — BP 170/60 | HR 61 | Ht 66.0 in | Wt 242.0 lb

## 2013-03-15 DIAGNOSIS — I4891 Unspecified atrial fibrillation: Secondary | ICD-10-CM

## 2013-03-15 MED ORDER — INSULIN REGULAR HUMAN 100 UNIT/ML IJ SOLN: INTRAMUSCULAR | Status: DC

## 2013-03-15 MED ORDER — INSULIN NPH (HUMAN) (ISOPHANE) 100 UNIT/ML ~~LOC~~ SUSP: SUBCUTANEOUS | Status: DC

## 2013-03-15 MED ORDER — APIXABAN 2.5 MG PO TABS: 2.5000 mg | ORAL_TABLET | Freq: Two times a day (BID) | ORAL | Status: DC

## 2013-03-15 NOTE — Progress Notes (Signed)
HPI: 79 year old female previously followed by Dr. Daleen Squibb for followup of atrial fibrillation. Patient apparently had a nuclear study in 2007 that showed an ejection fraction of 69% and no ischemia. Renal Dopplers in 2009 showed no renal artery stenosis. Patient has not been seen since 2012. She does not have dyspnea, chest pain or syncope. Occasional mild pedal edema. In September she had an episode of what she feels was atrial fibrillation. She describes her heart as pounding. This lasted 20 minutes and resolved.  Current Outpatient Prescriptions  Medication Sig Dispense Refill  . acetaminophen (TYLENOL) 500 MG tablet 2 by mouth three times a day       . aspirin 81 MG chewable tablet Take 2 tablets by mouth once daily       . atorvastatin (LIPITOR) 20 MG tablet TAKE 1 TABLET BY MOUTH AT BEDTIME  90 tablet  3  . bumetanide (BUMEX) 1 MG tablet TAKE 1 TABLET BY MOUTH TWICE DAILY  180 tablet  3  . Calcium 500 MG tablet 1 tablet by mouth once daily       . cetirizine (ZYRTEC) 10 MG tablet Take 10 mg by mouth daily as needed.       . cloNIDine (CATAPRES) 0.3 MG tablet TAKE 1 TABLET BY MOUTH TWICE DAILY  180 tablet  3  . cyclobenzaprine (FLEXERIL) 10 MG tablet Take 10 mg by mouth 2 (two) times daily as needed. For back pain.       Marland Kitchen diltiazem (TAZTIA XT) 300 MG 24 hr capsule Take 1 capsule (300 mg total) by mouth daily.  90 capsule  3  . fenofibrate 160 MG tablet TAKE 1 TABLET BY MOUTH ONCE DAILY  90 tablet  3  . Glucosamine-Chondroitin (OSTEO BI-FLEX REGULAR STRENGTH) 250-200 MG TABS Take 2 tablets by mouth daily.        Marland Kitchen glucose blood (ONE TOUCH ULTRA TEST) test strip Use as directed twice daily dx 250.00  100 each  3  . hydrALAZINE (APRESOLINE) 50 MG tablet TAKE 1 TABLET BY MOUTH THREE TIMES DAILY  90 tablet  1  . insulin NPH (NOVOLIN N RELION) 100 UNIT/ML injection INJECT 30 UNITS INTO THE SKIN AT BEDTIME DAILY.  60 mL  3  . insulin regular (NOVOLIN R RELION) 100 units/mL injection INJECT  15 UNITS BEFORE BREAKFAST, THEN INJECT 20 UNITS BEFORE LUNCH, THEN 30 UNITS BEFORE DINNER  60 mL  6  . Insulin Syringe-Needle U-100 (THINPRO INSULIN SYRINGE) 31G X 3/8" 0.3 ML MISC Any brand, four times a day  360 each  11  . Lancets (ONETOUCH ULTRASOFT) lancets Use as instructed  100 each  3  . losartan-hydrochlorothiazide (HYZAAR) 100-12.5 MG per tablet TAKE 1 TABLET BY MOUTH DAILY  90 tablet  3  . Multiple Vitamins-Minerals (PRESERVISION AREDS 2 PO) Take 1 capsule by mouth daily.      Marland Kitchen nystatin-triamcinolone ointment (MYCOLOG) Apply 1 application topically 2 (two) times daily.  60 g  3  . ondansetron (ZOFRAN) 8 MG tablet Take 1 tablet (8 mg total) by mouth every 8 (eight) hours as needed. For nausea  20 tablet  0  . polyethylene glycol (MIRALAX) powder As needed        No current facility-administered medications for this visit.     Past Medical History  Diagnosis Date  . PAROXYSMAL ATRIAL FIBRILLATION 09/29/2008  . HYPERCHOLESTEROLEMIA 06/24/2006  . HYPERTENSION, BENIGN SYSTEMIC 06/24/2006  . FATIGUE 11/22/2007  . RENAL FAILURE 11/22/2007  . RENAL  INSUFFICIENCY, CHRONIC 04/10/2008  . Proteinuria 05/17/2008  . Dermatophytosis of the body 11/30/2007  . NEOP, MALIGNANT, SKIN NOS 11/08/2006  . DIABETES MELLITUS II, UNCOMPLICATED 06/24/2006  . SYNDROME, CARPAL TUNNEL 11/08/2006  . PERIPHERAL NEUROPATHY, LOWER EXTREMITY 11/08/2006  . MACULAR DEGENERATION, SENILE, NONEXUDATIVE 01/01/2010  . CATARACTS, SENILE, NUCLEAR, BILATERAL 01/01/2010  . SMALL BOWEL OBSTRUCTION 11/05/2008  . INGROWN TOENAIL, INFECTED 04/10/2008  . OSTEOARTHRITIS, LOWER LEG 06/24/2006  . MUSCLE SPASM, TRAPEZIUS 04/02/2010  . Hyperpotassemia     Past Surgical History  Procedure Laterality Date  . Carpal tunnel release    . Appendectomy    . Abdominal hysterectomy    . Oophorectomy    . Tonsillectomy    . Spinal fusion      status post at least three back surgeries    History   Social History  . Marital Status: Married      Spouse Name: N/A    Number of Children: 4  . Years of Education: N/A   Occupational History  .      retired/disabled (1994)   Social History Main Topics  . Smoking status: Former Smoker    Quit date: 04/27/1978  . Smokeless tobacco: Not on file  . Alcohol Use: No  . Drug Use: No  . Sexual Activity: Not on file   Other Topics Concern  . Not on file   Social History Narrative   Four children   Regular exercise-no    ROS: no fevers or chills, productive cough, hemoptysis, dysphasia, odynophagia, melena, hematochezia, dysuria, hematuria, rash, seizure activity, orthopnea, PND, claudication. Remaining systems are negative.  Physical Exam: Well-developed obese in no acute distress.  Skin is warm and dry.  HEENT is normal.  Neck is supple.  Chest is clear to auscultation with normal expansion.  Cardiovascular exam is regular rate and rhythm. 2/6 systolic murmur apex Abdominal exam nontender or distended. No masses palpated. Extremities show trace edema. neuro grossly intact  ECG sinus rhythm with first degree AV block. Nonspecific ST changes.

## 2013-03-15 NOTE — Assessment & Plan Note (Signed)
Continue statin. 

## 2013-03-15 NOTE — Assessment & Plan Note (Signed)
Blood pressure is mildly elevated. I have asked her to follow this and we will increase hydralazine as needed.

## 2013-03-15 NOTE — Assessment & Plan Note (Addendum)
Patient has a history of paroxysmal atrial fibrillation and feels she had an episode in September. She is in sinus today. Continue Cardizem. She has multiple embolic risk factors. Discontinue aspirin. Begin apixaban 2.5 mg by mouth twice a day. Check hemoglobin and renal function in 4 weeks. Repeat echocardiogram.

## 2013-03-15 NOTE — Telephone Encounter (Signed)
Med filled. For additional dose. Per pharmacist we were not giving her enough to cover the month.

## 2013-03-15 NOTE — Patient Instructions (Signed)
Your physician recommends that you schedule a follow-up appointment in: 8 WEEKS WITH DR CRENSHAW IN [place]  TRACK BLOOD PRESSURE AT HOME AND BRING TO NEXT APPOINTMENT  STOP ASPIRIN  START ELIQUIS 2.5 MG ONE TABLET TWICE DAILY  Your physician has requested that you have an echocardiogram. Echocardiography is a painless test that uses sound waves to create images of your heart. It provides your doctor with information about the size and shape of your heart and how well your heart's chambers and valves are working. This procedure takes approximately one hour. There are no restrictions for this procedure.AT THE [facility] OFFICE  Your physician recommends that you return for lab work in: 4 WEEKS

## 2013-03-16 ENCOUNTER — Telehealth: Payer: Self-pay | Admitting: *Deleted

## 2013-03-16 NOTE — Telephone Encounter (Signed)
Prior authorization from [employer] for eliquis 2.5 mg for 1 year PA# [id number], [facility] called.

## 2013-03-17 ENCOUNTER — Telehealth: Payer: Self-pay | Admitting: Nurse Practitioner

## 2013-03-17 NOTE — Telephone Encounter (Signed)
Spoke with patient to review Dr. Ludwig Clarks advice.  Patient states she cannot take Coumadin but is willing to try Xarelto.  I gave the patient the number for Xarelto [facility] [phone]) and advised patient to call for price.  I am sending message to Dr. Jens Som for desired dose.  Patient verbalized understanding and agreement.

## 2013-03-17 NOTE — Telephone Encounter (Signed)
Cannot take pradaxa or xeralto as GFR <30. Shelia Woods

## 2013-03-17 NOTE — Telephone Encounter (Signed)
If she can afford xeralto or pradaxa, can consider; also can consider coumadin if willing. Risk of CVA with afib and aspirin higher than with coumadin or NOAC Shelia Woods

## 2013-03-17 NOTE — Telephone Encounter (Signed)
Spoke with patient while reviewing her husband's lab results.  Patient states Dr. Jens Som wanted her to start Eliquis.  Patient states that even with the help from the pharmaceutical company Eliquis is too expensive and she will remain on ASA.  I asked if patient had card for free 30 day supply and she states that she does but that there is no need to take it because she cannot afford it after the 1st month.  Patient asked me to let Dr. Jens Som and Deliah Goody, RN know.  I am sending message to both.

## 2013-03-20 MED ORDER — ASPIRIN EC 325 MG PO TBEC: 325.0000 mg | DELAYED_RELEASE_TABLET | Freq: Every day | ORAL | Status: DC

## 2013-03-20 NOTE — Addendum Note (Signed)
Addended by: Freddi Starr on: 03/20/2013 12:20 PM   Modules accepted: Orders, Medications

## 2013-03-20 NOTE — Telephone Encounter (Signed)
Dr Jens Som would like the pt to take coumadin. After discussing with the patient she is unable to use warfarin, she has tried it in the past. Thew pt was instructed to start aspirin 325 mg once daily with food.

## 2013-03-29 ENCOUNTER — Encounter: Payer: Self-pay | Admitting: Cardiology

## 2013-03-29 ENCOUNTER — Ambulatory Visit (HOSPITAL_COMMUNITY): Payer: Medicare Other | Attending: Cardiology | Admitting: Radiology

## 2013-03-29 DIAGNOSIS — E7800 Pure hypercholesterolemia, unspecified: Secondary | ICD-10-CM | POA: Insufficient documentation

## 2013-03-29 DIAGNOSIS — Z87891 Personal history of nicotine dependence: Secondary | ICD-10-CM | POA: Insufficient documentation

## 2013-03-29 DIAGNOSIS — E119 Type 2 diabetes mellitus without complications: Secondary | ICD-10-CM | POA: Insufficient documentation

## 2013-03-29 DIAGNOSIS — Z6839 Body mass index (BMI) 39.0-39.9, adult: Secondary | ICD-10-CM | POA: Insufficient documentation

## 2013-03-29 DIAGNOSIS — I4891 Unspecified atrial fibrillation: Secondary | ICD-10-CM

## 2013-03-29 NOTE — Progress Notes (Signed)
Echocardiogram performed.  

## 2013-04-26 ENCOUNTER — Other Ambulatory Visit: Payer: Self-pay | Admitting: Family Medicine

## 2013-04-28 NOTE — Telephone Encounter (Signed)
Med filled.  

## 2013-05-10 ENCOUNTER — Ambulatory Visit: Payer: Medicare Other | Admitting: Cardiology

## 2013-05-23 ENCOUNTER — Other Ambulatory Visit: Payer: Self-pay | Admitting: Family Medicine

## 2013-05-23 NOTE — Telephone Encounter (Signed)
Med filled.  

## 2013-05-27 ENCOUNTER — Other Ambulatory Visit: Payer: Self-pay | Admitting: Family Medicine

## 2013-05-29 NOTE — Telephone Encounter (Signed)
Med filled.  

## 2013-06-17 ENCOUNTER — Other Ambulatory Visit: Payer: Self-pay | Admitting: Family Medicine

## 2013-06-20 NOTE — Telephone Encounter (Signed)
Med filled.  

## 2013-07-15 ENCOUNTER — Other Ambulatory Visit: Payer: Self-pay | Admitting: Family Medicine

## 2013-07-17 NOTE — Telephone Encounter (Signed)
Med filled.  

## 2013-08-03 ENCOUNTER — Encounter: Payer: Self-pay | Admitting: Family Medicine

## 2013-08-03 ENCOUNTER — Ambulatory Visit (INDEPENDENT_AMBULATORY_CARE_PROVIDER_SITE_OTHER): Payer: Medicare Other | Admitting: Family Medicine

## 2013-08-03 VITALS — BP 148/62 | HR 56 | Temp 98.2°F | Resp 16

## 2013-08-03 DIAGNOSIS — I10 Essential (primary) hypertension: Secondary | ICD-10-CM

## 2013-08-03 DIAGNOSIS — E7800 Pure hypercholesterolemia, unspecified: Secondary | ICD-10-CM

## 2013-08-03 DIAGNOSIS — E1142 Type 2 diabetes mellitus with diabetic polyneuropathy: Secondary | ICD-10-CM

## 2013-08-03 DIAGNOSIS — E1140 Type 2 diabetes mellitus with diabetic neuropathy, unspecified: Secondary | ICD-10-CM

## 2013-08-03 DIAGNOSIS — G5790 Unspecified mononeuropathy of unspecified lower limb: Secondary | ICD-10-CM

## 2013-08-03 DIAGNOSIS — E1149 Type 2 diabetes mellitus with other diabetic neurological complication: Secondary | ICD-10-CM

## 2013-08-03 LAB — LIPID PANEL
Cholesterol: 226 mg/dL — ABNORMAL HIGH (ref 0–200)
HDL: 40.1 mg/dL (ref >39.00)
LDL Cholesterol: 104 mg/dL — ABNORMAL HIGH (ref 0–99)
TRIGLYCERIDES: 410 mg/dL — AB (ref 0.0–149.0)
Total CHOL/HDL Ratio: 6
VLDL: 82 mg/dL — ABNORMAL HIGH (ref 0.0–40.0)

## 2013-08-03 LAB — CBC WITH DIFFERENTIAL/PLATELET
BASOS ABS: 0 10*3/uL (ref 0.0–0.1)
Basophils Relative: 0.3 % (ref 0.0–3.0)
EOS PCT: 0.6 % (ref 0.0–5.0)
Eosinophils Absolute: 0.1 10*3/uL (ref 0.0–0.7)
HCT: 37.1 % (ref 36.0–46.0)
Hemoglobin: 12.2 g/dL (ref 12.0–15.0)
Lymphocytes Relative: 16.5 % (ref 12.0–46.0)
Lymphs Abs: 1.5 10*3/uL (ref 0.7–4.0)
MCHC: 32.9 g/dL (ref 30.0–36.0)
MCV: 86.5 fl (ref 78.0–100.0)
MONO ABS: 0.7 10*3/uL (ref 0.1–1.0)
MONOS PCT: 7.9 % (ref 3.0–12.0)
NEUTROS PCT: 74.7 % (ref 43.0–77.0)
Neutro Abs: 6.9 10*3/uL (ref 1.4–7.7)
PLATELETS: 373 10*3/uL (ref 150.0–400.0)
RBC: 4.29 Mil/uL (ref 3.87–5.11)
RDW: 15.4 % — AB (ref 11.5–14.6)
WBC: 9.2 10*3/uL (ref 4.5–10.5)

## 2013-08-03 LAB — HEPATIC FUNCTION PANEL
ALT: 16 U/L (ref 0–35)
AST: 17 U/L (ref 0–37)
Albumin: 3.5 g/dL (ref 3.5–5.2)
Alkaline Phosphatase: 36 U/L — ABNORMAL LOW (ref 39–117)
Bilirubin, Direct: 0 mg/dL (ref 0.0–0.3)
TOTAL PROTEIN: 7.1 g/dL (ref 6.0–8.3)
Total Bilirubin: 0.5 mg/dL (ref 0.3–1.2)

## 2013-08-03 LAB — BASIC METABOLIC PANEL
BUN: 40 mg/dL — ABNORMAL HIGH (ref 6–23)
CALCIUM: 9.8 mg/dL (ref 8.4–10.5)
CHLORIDE: 102 meq/L (ref 96–112)
CO2: 27 meq/L (ref 19–32)
Creatinine, Ser: 1.5 mg/dL — ABNORMAL HIGH (ref 0.4–1.2)
GFR: 34.87 mL/min — ABNORMAL LOW (ref >60.00)
GLUCOSE: 183 mg/dL — AB (ref 70–99)
Potassium: 3.7 mEq/L (ref 3.5–5.1)
SODIUM: 138 meq/L (ref 135–145)

## 2013-08-03 LAB — HEMOGLOBIN A1C: HEMOGLOBIN A1C: 6.8 % — AB (ref 4.6–6.5)

## 2013-08-03 LAB — TSH: TSH: 1.96 u[IU]/mL (ref 0.35–5.50)

## 2013-08-03 MED ORDER — GABAPENTIN 100 MG PO CAPS: 100.0000 mg | ORAL_CAPSULE | Freq: Three times a day (TID) | ORAL | Status: DC

## 2013-08-03 NOTE — Progress Notes (Signed)
   Subjective:    Patient ID: Shelia Woods, female    DOB: January 04, 1931, 80 y.o.   MRN: [id number]  HPI DM- chronic problem, on NPH and regular insulin (30 QHS ,15/20/30).  UTD on eye exam (beginning of the year).  CBGs ranging 100-150.  Will have symptomatic lows when levels near 100.  + neuropathy- unchanged.  HTN- chronic problem, on Losartan HCTZ, Bumex, Hydralazine, dilt, Clonidine.  No CP, SOB, HAs, visual changes, edema above baseline.  Hyperlipidemia- chronic problem, on fenofibrate and lipitor.  Denies abd pain, N/V, myalgias   Review of Systems For ROS see HPI     Objective:   Physical Exam  Vitals reviewed. Constitutional: She is oriented to person, place, and time. She appears well-developed and well-nourished. No distress.  HENT:  Head: Normocephalic and atraumatic.  Eyes: Conjunctivae and EOM are normal. Pupils are equal, round, and reactive to light.  Neck: Normal range of motion. Neck supple. No thyromegaly present.  Cardiovascular: Normal rate, regular rhythm, normal heart sounds and intact distal pulses.   No murmur heard. Pulmonary/Chest: Effort normal and breath sounds normal. No respiratory distress.  Abdominal: Soft. She exhibits no distension. There is no tenderness.  Musculoskeletal: She exhibits edema (bilateral LE edema).  Lymphadenopathy:    She has no cervical adenopathy.  Neurological: She is alert and oriented to person, place, and time.  Skin: Skin is warm and dry.  Psychiatric: She has a normal mood and affect. Her behavior is normal.          Assessment & Plan:

## 2013-08-03 NOTE — Assessment & Plan Note (Signed)
Chronic problem.  Asymptomatic.  Pt has hx of difficult to control BP.  Reading today is good for pt.  Check labs.  No anticipated med changes.

## 2013-08-03 NOTE — Assessment & Plan Note (Signed)
Chronic problem.  Pt has been unwilling to try medication but reports she is having difficulty sleeping at night due to pain.  Is willing to start Neurontin at 100mg  nightly and monitor for symptom improvement.  Will titrate dose as needed.

## 2013-08-03 NOTE — Assessment & Plan Note (Signed)
Chronic problem.  Pt taking insulin as directed and by report, CBGs are well controlled.  UTD on eye exam.  Unable to do foot exam today b/c pt won't let me remove her shoes for fear that she won't get them back on.  Check labs.  Adjust meds prn

## 2013-08-03 NOTE — Progress Notes (Signed)
Pre visit review using our clinic review tool, if applicable. No additional management support is needed unless otherwise documented below in the visit note. 

## 2013-08-03 NOTE — Assessment & Plan Note (Signed)
Chronic problem.  Pt tolerating Lipitor and Fenofibrate w/o difficulty.  Check labs.  Adjust meds prn

## 2013-08-03 NOTE — Patient Instructions (Signed)
Schedule your complete physical in 6 months We'll notify you of your lab results and make any changes Start the Neurontin nightly before bed.  If it helps the pain, increase to twice daily Call with any questions or concerns Hang in there!!

## 2013-08-04 ENCOUNTER — Encounter: Payer: Self-pay | Admitting: General Practice

## 2013-08-04 ENCOUNTER — Telehealth: Payer: Self-pay | Admitting: Family Medicine

## 2013-08-04 NOTE — Telephone Encounter (Signed)
Relevant patient education assigned to patient using Emmi. ° °

## 2013-08-12 ENCOUNTER — Other Ambulatory Visit: Payer: Self-pay | Admitting: Family Medicine

## 2013-08-14 NOTE — Telephone Encounter (Signed)
Med filled.  

## 2013-08-15 ENCOUNTER — Telehealth: Payer: Self-pay

## 2013-08-15 NOTE — Telephone Encounter (Signed)
Relevant patient education assigned to patient using Emmi. ° °

## 2013-09-08 ENCOUNTER — Other Ambulatory Visit: Payer: Self-pay | Admitting: Family Medicine

## 2013-09-08 NOTE — Telephone Encounter (Signed)
Med filled.  

## 2013-10-25 ENCOUNTER — Other Ambulatory Visit: Payer: Self-pay | Admitting: Family Medicine

## 2013-10-25 DIAGNOSIS — G609 Hereditary and idiopathic neuropathy, unspecified: Secondary | ICD-10-CM

## 2013-10-26 ENCOUNTER — Telehealth: Payer: Self-pay | Admitting: *Deleted

## 2013-10-26 NOTE — Telephone Encounter (Signed)
Ok 90, no RF 

## 2013-10-26 NOTE — Telephone Encounter (Signed)
rx refill - Gabapentin 100mg  Last OV- 08/03/13 Last refilled- 08/03/13 #90/3rf  UDS- none

## 2013-10-26 NOTE — Telephone Encounter (Signed)
Refill for neurontin sent to [facility]

## 2013-10-27 ENCOUNTER — Emergency Department (HOSPITAL_COMMUNITY)
Admission: EM | Admit: 2013-10-27 | Discharge: 2013-10-27 | Disposition: A | Discharge status: Home / Self Care | Payer: Medicare Other | Attending: Emergency Medicine | Admitting: Emergency Medicine

## 2013-10-27 ENCOUNTER — Encounter (HOSPITAL_COMMUNITY): Payer: Self-pay | Admitting: Emergency Medicine

## 2013-10-27 ENCOUNTER — Emergency Department (HOSPITAL_COMMUNITY): Payer: Medicare Other

## 2013-10-27 DIAGNOSIS — Z880 Allergy status to penicillin: Secondary | ICD-10-CM | POA: Insufficient documentation

## 2013-10-27 DIAGNOSIS — I4891 Unspecified atrial fibrillation: Secondary | ICD-10-CM | POA: Insufficient documentation

## 2013-10-27 DIAGNOSIS — IMO0002 Reserved for concepts with insufficient information to code with codable children: Secondary | ICD-10-CM | POA: Insufficient documentation

## 2013-10-27 DIAGNOSIS — E1139 Type 2 diabetes mellitus with other diabetic ophthalmic complication: Secondary | ICD-10-CM | POA: Insufficient documentation

## 2013-10-27 DIAGNOSIS — E782 Mixed hyperlipidemia: Secondary | ICD-10-CM | POA: Insufficient documentation

## 2013-10-27 DIAGNOSIS — Z8719 Personal history of other diseases of the digestive system: Secondary | ICD-10-CM | POA: Insufficient documentation

## 2013-10-27 DIAGNOSIS — E1136 Type 2 diabetes mellitus with diabetic cataract: Secondary | ICD-10-CM | POA: Insufficient documentation

## 2013-10-27 DIAGNOSIS — Y9389 Activity, other specified: Secondary | ICD-10-CM | POA: Insufficient documentation

## 2013-10-27 DIAGNOSIS — Z91040 Latex allergy status: Secondary | ICD-10-CM | POA: Insufficient documentation

## 2013-10-27 DIAGNOSIS — Y92009 Unspecified place in unspecified non-institutional (private) residence as the place of occurrence of the external cause: Secondary | ICD-10-CM | POA: Insufficient documentation

## 2013-10-27 DIAGNOSIS — G589 Mononeuropathy, unspecified: Secondary | ICD-10-CM | POA: Insufficient documentation

## 2013-10-27 DIAGNOSIS — S300XXA Contusion of lower back and pelvis, initial encounter: Secondary | ICD-10-CM | POA: Insufficient documentation

## 2013-10-27 DIAGNOSIS — I120 Hypertensive chronic kidney disease with stage 5 chronic kidney disease or end stage renal disease: Secondary | ICD-10-CM | POA: Insufficient documentation

## 2013-10-27 DIAGNOSIS — R296 Repeated falls: Secondary | ICD-10-CM | POA: Insufficient documentation

## 2013-10-27 DIAGNOSIS — R42 Dizziness and giddiness: Secondary | ICD-10-CM | POA: Insufficient documentation

## 2013-10-27 DIAGNOSIS — G5600 Carpal tunnel syndrome, unspecified upper limb: Secondary | ICD-10-CM | POA: Insufficient documentation

## 2013-10-27 DIAGNOSIS — Z79899 Other long term (current) drug therapy: Secondary | ICD-10-CM | POA: Insufficient documentation

## 2013-10-27 DIAGNOSIS — Z794 Long term (current) use of insulin: Secondary | ICD-10-CM | POA: Insufficient documentation

## 2013-10-27 DIAGNOSIS — W19XXXA Unspecified fall, initial encounter: Secondary | ICD-10-CM

## 2013-10-27 DIAGNOSIS — N189 Chronic kidney disease, unspecified: Secondary | ICD-10-CM | POA: Insufficient documentation

## 2013-10-27 DIAGNOSIS — H3530 Unspecified macular degeneration: Secondary | ICD-10-CM | POA: Insufficient documentation

## 2013-10-27 DIAGNOSIS — M62838 Other muscle spasm: Secondary | ICD-10-CM | POA: Insufficient documentation

## 2013-10-27 DIAGNOSIS — Z87891 Personal history of nicotine dependence: Secondary | ICD-10-CM | POA: Insufficient documentation

## 2013-10-27 DIAGNOSIS — M1710 Unilateral primary osteoarthritis, unspecified knee: Secondary | ICD-10-CM | POA: Insufficient documentation

## 2013-10-27 LAB — I-STAT CHEM 8, ED
BUN: 46 mg/dL — AB (ref 6–23)
CHLORIDE: 105 meq/L (ref 96–112)
Calcium, Ion: 1.26 mmol/L (ref 1.13–1.30)
Creatinine, Ser: 1.6 mg/dL — ABNORMAL HIGH (ref 0.50–1.10)
Glucose, Bld: 222 mg/dL — ABNORMAL HIGH (ref 70–99)
HEMATOCRIT: 42 % (ref 36.0–46.0)
Hemoglobin: 14.3 g/dL (ref 12.0–15.0)
POTASSIUM: 3.5 meq/L — AB (ref 3.7–5.3)
SODIUM: 142 meq/L (ref 137–147)
TCO2: 24 mmol/L (ref 0–100)

## 2013-10-27 LAB — CBC
HEMATOCRIT: 38.7 % (ref 36.0–46.0)
HEMOGLOBIN: 12.7 g/dL (ref 12.0–15.0)
MCH: 29.3 pg (ref 26.0–34.0)
MCHC: 32.8 g/dL (ref 30.0–36.0)
MCV: 89.2 fL (ref 78.0–100.0)
Platelets: 401 10*3/uL — ABNORMAL HIGH (ref 150–400)
RBC: 4.34 MIL/uL (ref 3.87–5.11)
RDW: 14.3 % (ref 11.5–15.5)
WBC: 11.8 10*3/uL — ABNORMAL HIGH (ref 4.0–10.5)

## 2013-10-27 MED ORDER — TRAMADOL HCL 50 MG PO TABS: 50.0000 mg | ORAL_TABLET | Freq: Four times a day (QID) | ORAL | Status: DC | PRN

## 2013-10-27 MED ORDER — MORPHINE SULFATE 4 MG/ML IJ SOLN
4.0000 mg | Freq: Once | INTRAMUSCULAR | Status: AC
  Administered 2013-10-27: 4 mg via INTRAMUSCULAR
  Filled 2013-10-27: qty 1

## 2013-10-27 MED ORDER — ONDANSETRON 4 MG PO TBDP
8.0000 mg | ORAL_TABLET | Freq: Once | ORAL | Status: AC
  Administered 2013-10-27: 8 mg via ORAL
  Filled 2013-10-27: qty 2

## 2013-10-27 MED ORDER — TRAMADOL HCL 50 MG PO TABS: 50.0000 mg | ORAL_TABLET | Freq: Once | ORAL | Status: DC

## 2013-10-27 MED ORDER — HYDROMORPHONE HCL PF 1 MG/ML IJ SOLN
1.0000 mg | Freq: Once | INTRAMUSCULAR | Status: DC
  Filled 2013-10-27: qty 1

## 2013-10-27 MED ORDER — ONDANSETRON HCL 4 MG PO TABS: 4.0000 mg | ORAL_TABLET | Freq: Four times a day (QID) | ORAL | Status: DC | PRN

## 2013-10-27 NOTE — Discharge Instructions (Signed)
Fall Prevention and Home Safety Falls cause injuries and can affect all age groups. It is possible to use preventive measures to significantly decrease the likelihood of falls. There are many simple measures which can make your home safer and prevent falls. OUTDOORS  Repair cracks and edges of walkways and driveways.  Remove high doorway thresholds.  Trim shrubbery on the main path into your home.  Have good outside lighting.  Clear walkways of tools, rocks, debris, and clutter.  Check that handrails are not broken and are securely fastened. Both sides of steps should have handrails.  Have leaves, snow, and ice cleared regularly.  Use sand or salt on walkways during winter months.  In the garage, clean up grease or oil spills. BATHROOM  Install night lights.  Install grab bars by the toilet and in the tub and shower.  Use non-skid mats or decals in the tub or shower.  Place a plastic non-slip stool in the shower to sit on, if needed.  Keep floors dry and clean up all water on the floor immediately.  Remove soap buildup in the tub or shower on a regular basis.  Secure bath mats with non-slip, double-sided rug tape.  Remove throw rugs and tripping hazards from the floors. BEDROOMS  Install night lights.  Make sure a bedside light is easy to reach.  Do not use oversized bedding.  Keep a telephone by your bedside.  Have a firm chair with side arms to use for getting dressed.  Remove throw rugs and tripping hazards from the floor. KITCHEN  Keep handles on pots and pans turned toward the center of the stove. Use back burners when possible.  Clean up spills quickly and allow time for drying.  Avoid walking on wet floors.  Avoid hot utensils and knives.  Position shelves so they are not too high or low.  Place commonly used objects within easy reach.  If necessary, use a sturdy step stool with a grab bar when reaching.  Keep electrical cables out of the  way.  Do not use floor polish or wax that makes floors slippery. If you must use wax, use non-skid floor wax.  Remove throw rugs and tripping hazards from the floor. STAIRWAYS  Never leave objects on stairs.  Place handrails on both sides of stairways and use them. Fix any loose handrails. Make sure handrails on both sides of the stairways are as long as the stairs.  Check carpeting to make sure it is firmly attached along stairs. Make repairs to worn or loose carpet promptly.  Avoid placing throw rugs at the top or bottom of stairways, or properly secure the rug with carpet tape to prevent slippage. Get rid of throw rugs, if possible.  Have an electrician put in a light switch at the top and bottom of the stairs. OTHER FALL PREVENTION TIPS  Wear low-heel or rubber-soled shoes that are supportive and fit well. Wear closed toe shoes.  When using a stepladder, make sure it is fully opened and both spreaders are firmly locked. Do not climb a closed stepladder.  Add color or contrast paint or tape to grab bars and handrails in your home. Place contrasting color strips on first and last steps.  Learn and use mobility aids as needed. Install an electrical emergency response system.  Turn on lights to avoid dark areas. Replace light bulbs that burn out immediately. Get light switches that glow.  Arrange furniture to create clear pathways. Keep furniture in the same place.  Firmly attach carpet with non-skid or double-sided tape.  Eliminate uneven floor surfaces.  Select a carpet pattern that does not visually hide the edge of steps.  Be aware of all pets. OTHER HOME SAFETY TIPS  Set the water temperature for 120 F (48.8 C).  Keep emergency numbers on or near the telephone.  Keep smoke detectors on every level of the home and near sleeping areas. Document Released: 04/03/2002 Document Revised: 10/13/2011 Document Reviewed: 07/03/2011 [facility] Patient Information 2015  [place], [place]. This information is not intended to replace advice given to you by your health care provider. Make sure you discuss any questions you have with your health care provider.  Tailbone Injury The tailbone (coccyx) is the small bone at the lower end of the spine. A tailbone injury may involve stretched ligaments, bruising, or a broken bone (fracture). Women are more vulnerable to this injury due to having a wider pelvis. CAUSES  This type of injury typically occurs from falling and landing on the tailbone. Repeated strain or friction from actions such as rowing and bicycling may also injure the area. The tailbone can be injured during childbirth. Infections or tumors may also press on the tailbone and cause pain. Sometimes, the cause of injury is unknown. SYMPTOMS   Bruising.  Pain when sitting.  Painful bowel movements.  In women, pain during intercourse. DIAGNOSIS  Your caregiver can diagnose a tailbone injury based on your symptoms and a physical exam. X-rays may be taken if a fracture is suspected. Your caregiver may also use an MRI scan imaging test to evaluate your symptoms. TREATMENT  Your caregiver may prescribe medicines to help relieve your pain. Most tailbone injuries heal on their own in 4 to 6 weeks. However, if the injury is caused by an infection or tumor, the recovery period may vary. PREVENTION  Wear appropriate padding and sports gear when bicycling and rowing. This can help prevent an injury from repeated strain or friction. HOME CARE INSTRUCTIONS   Put ice on the injured area.  Put ice in a plastic bag.  Place a towel between your skin and the bag.  Leave the ice on for 15-20 minutes, every hour while awake for the first 1 to 2 days.  Sit on a large, rubber or inflated ring or cushion to ease your pain. Lean forward when sitting to help decrease discomfort.  Avoid sitting for long periods of time.  Increase your activity as the pain allows.  Only  take over-the-counter or prescription medicines for pain, discomfort, or fever as directed by your caregiver.  You may use stool softeners if it is painful to have a bowel movement, or as directed by your caregiver.  Eat a diet with plenty of fiber to help prevent constipation.  Keep all follow-up appointments as directed by your caregiver. SEEK MEDICAL CARE IF:   Your pain becomes worse.  Your bowel movements cause a great deal of discomfort.  You are unable to have a bowel movement.  You have a fever. MAKE SURE YOU:  Understand these instructions.  Will watch your condition.  Will get help right away if you are not doing well or get worse. Document Released: 04/10/2000 Document Revised: 07/06/2011 Document Reviewed: 11/06/2010 [facility] Patient Information 2015 [place], [place]. This information is not intended to replace advice given to you by your health care provider. Make sure you discuss any questions you have with your health care provider.  Tramadol tablets What is this medicine? TRAMADOL (TRA ma dole) is  a pain reliever. It is used to treat moderate to severe pain in adults. This medicine may be used for other purposes; ask your health care provider or pharmacist if you have questions. COMMON BRAND NAME(S): Ultram What should I tell my health care provider before I take this medicine? They need to know if you have any of these conditions: -brain tumor -depression -drug abuse or addiction -head injury -if you frequently drink alcohol containing drinks -kidney disease or trouble passing urine -liver disease -lung disease, asthma, or breathing problems -seizures or epilepsy -suicidal thoughts, plans, or attempt; a previous suicide attempt by you or a family member -an unusual or allergic reaction to tramadol, codeine, other medicines, foods, dyes, or preservatives -pregnant or trying to get pregnant -breast-feeding How should I use this medicine? Take this medicine  by mouth with a full glass of water. Follow the directions on the prescription label. If the medicine upsets your stomach, take it with food or milk. Do not take more medicine than you are told to take. Talk to your pediatrician regarding the use of this medicine in children. Special care may be needed. Overdosage: If you think you have taken too much of this medicine contact a poison control center or emergency room at once. NOTE: This medicine is only for you. Do not share this medicine with others. What if I miss a dose? If you miss a dose, take it as soon as you can. If it is almost time for your next dose, take only that dose. Do not take double or extra doses. What may interact with this medicine? Do not take this medicine with any of the following medications: -MAOIs like Carbex, Eldepryl, Marplan, Nardil, and Parnate This medicine may also interact with the following medications: -alcohol or medicines that contain alcohol -antihistamines -benzodiazepines -bupropion -carbamazepine or oxcarbazepine -clozapine -cyclobenzaprine -digoxin -furazolidone -linezolid -medicines for depression, anxiety, or psychotic disturbances -medicines for migraine headache like almotriptan, eletriptan, frovatriptan, naratriptan, rizatriptan, sumatriptan, zolmitriptan -medicines for pain like pentazocine, buprenorphine, butorphanol, meperidine, nalbuphine, and propoxyphene -medicines for sleep -muscle relaxants -naltrexone -phenobarbital -phenothiazines like perphenazine, thioridazine, chlorpromazine, mesoridazine, fluphenazine, prochlorperazine, promazine, and trifluoperazine -procarbazine -warfarin This list may not describe all possible interactions. Give your health care provider a list of all the medicines, herbs, non-prescription drugs, or dietary supplements you use. Also tell them if you smoke, drink alcohol, or use illegal drugs. Some items may interact with your medicine. What should I watch  for while using this medicine? Tell your doctor or health care professional if your pain does not go away, if it gets worse, or if you have new or a different type of pain. You may develop tolerance to the medicine. Tolerance means that you will need a higher dose of the medicine for pain relief. Tolerance is normal and is expected if you take this medicine for a long time. Do not suddenly stop taking your medicine because you may develop a severe reaction. Your body becomes used to the medicine. This does NOT mean you are addicted. Addiction is a behavior related to getting and using a drug for a non-medical reason. If you have pain, you have a medical reason to take pain medicine. Your doctor will tell you how much medicine to take. If your doctor wants you to stop the medicine, the dose will be slowly lowered over time to avoid any side effects. You may get drowsy or dizzy. Do not drive, use machinery, or do anything that needs mental alertness until you  know how this medicine affects you. Do not stand or sit up quickly, especially if you are an older patient. This reduces the risk of dizzy or fainting spells. Alcohol can increase or decrease the effects of this medicine. Avoid alcoholic drinks. You may have constipation. Try to have a bowel movement at least every 2 to 3 days. If you do not have a bowel movement for 3 days, call your doctor or health care professional. Your mouth may get dry. Chewing sugarless gum or sucking hard candy, and drinking plenty of water may help. Contact your doctor if the problem does not go away or is severe. What side effects may I notice from receiving this medicine? Side effects that you should report to your doctor or health care professional as soon as possible: -allergic reactions like skin rash, itching or hives, swelling of the face, lips, or tongue -breathing difficulties, wheezing -confusion -itching -light headedness or fainting spells -redness, blistering,  peeling or loosening of the skin, including inside the mouth -seizures Side effects that usually do not require medical attention (report to your doctor or health care professional if they continue or are bothersome): -constipation -dizziness -drowsiness -headache -nausea, vomiting This list may not describe all possible side effects. Call your doctor for medical advice about side effects. You may report side effects to FDA at 1-800-FDA-1088. Where should I keep my medicine? Keep out of the reach of children. Store at room temperature between 15 and 30 degrees C (59 and 86 degrees F). Keep container tightly closed. Throw away any unused medicine after the expiration date. NOTE: This sheet is a summary. It may not cover all possible information. If you have questions about this medicine, talk to your doctor, pharmacist, or health care provider.  2015, [employer]. (2009-12-25 11:55:44)  Ondansetron tablets What is this medicine? ONDANSETRON (on DAN se tron) is used to treat nausea and vomiting caused by chemotherapy. It is also used to prevent or treat nausea and vomiting after surgery. This medicine may be used for other purposes; ask your health care provider or pharmacist if you have questions. COMMON BRAND NAME(S): Zofran What should I tell my health care provider before I take this medicine? They need to know if you have any of these conditions: -heart disease -history of irregular heartbeat -liver disease -low levels of magnesium or potassium in the blood -an unusual or allergic reaction to ondansetron, granisetron, other medicines, foods, dyes, or preservatives -pregnant or trying to get pregnant -breast-feeding How should I use this medicine? Take this medicine by mouth with a glass of water. Follow the directions on your prescription label. Take your doses at regular intervals. Do not take your medicine more often than directed. Talk to your pediatrician regarding  the use of this medicine in children. Special care may be needed. Overdosage: If you think you have taken too much of this medicine contact a poison control center or emergency room at once. NOTE: This medicine is only for you. Do not share this medicine with others. What if I miss a dose? If you miss a dose, take it as soon as you can. If it is almost time for your next dose, take only that dose. Do not take double or extra doses. What may interact with this medicine? Do not take this medicine with any of the following medications: -apomorphine -certain medicines for fungal infections like fluconazole, itraconazole, ketoconazole, posaconazole, voriconazole -cisapride -dofetilide -dronedarone -pimozide -thioridazine -ziprasidone This medicine may also interact with the following  medications: -carbamazepine -certain medicines for depression, anxiety, or psychotic disturbances -fentanyl -linezolid -MAOIs like Carbex, Eldepryl, Marplan, Nardil, and Parnate -methylene blue (injected into a vein) -other medicines that prolong the QT interval (cause an abnormal heart rhythm) -phenytoin -rifampicin -tramadol This list may not describe all possible interactions. Give your health care provider a list of all the medicines, herbs, non-prescription drugs, or dietary supplements you use. Also tell them if you smoke, drink alcohol, or use illegal drugs. Some items may interact with your medicine. What should I watch for while using this medicine? Check with your doctor or health care professional right away if you have any sign of an allergic reaction. What side effects may I notice from receiving this medicine? Side effects that you should report to your doctor or health care professional as soon as possible: -allergic reactions like skin rash, itching or hives, swelling of the face, lips or tongue -breathing problems -confusion -dizziness -fast or irregular heartbeat -feeling faint or  lightheaded, falls -fever and chills -loss of balance or coordination -seizures -sweating -swelling of the hands or feet -tightness in the chest -tremors -unusually weak or tired Side effects that usually do not require medical attention (report to your doctor or health care professional if they continue or are bothersome): -constipation or diarrhea -headache This list may not describe all possible side effects. Call your doctor for medical advice about side effects. You may report side effects to FDA at 1-800-FDA-1088. Where should I keep my medicine? Keep out of the reach of children. Store between 2 and 30 degrees C (36 and 86 degrees F). Throw away any unused medicine after the expiration date. NOTE: This sheet is a summary. It may not cover all possible information. If you have questions about this medicine, talk to your doctor, pharmacist, or health care provider.  2015, [employer]. (2013-01-18 16:27:45)

## 2013-10-27 NOTE — ED Provider Notes (Signed)
CSN: [id number]     Arrival date & time 10/27/13  1759 History   First MD Initiated Contact with Patient 10/27/13 1831     Chief Complaint  Patient presents with  . Tailbone Pain  . Fall  . Dizziness     (Consider location/radiation/quality/duration/timing/severity/associated sxs/prior Treatment) Patient is a 80 y.o. female presenting with fall and dizziness. The history is provided by the patient.  Fall  Dizziness She got up from her recliner to go to the bathroom and got dizzy and fell backwards hitting her tailbone. She was unable to get up and had to call EMS to come and help her up. She's complaining of pain in her tailbone area. Pain is severe and she rates it 10/10. She has been able to get up since that initial fall. Denies any difficulty with bowel or bladder. She denies other injury. She has frequent difficulty with dizziness and always has to walk with either a cane or with a walker. She was trying to walk with her cane when this fall occurred.  Past Medical History  Diagnosis Date  . PAROXYSMAL ATRIAL FIBRILLATION 09/29/2008  . HYPERCHOLESTEROLEMIA 06/24/2006  . HYPERTENSION, BENIGN SYSTEMIC 06/24/2006  . FATIGUE 11/22/2007  . RENAL FAILURE 11/22/2007  . RENAL INSUFFICIENCY, CHRONIC 04/10/2008  . Proteinuria 05/17/2008  . Dermatophytosis of the body 11/30/2007  . NEOP, MALIGNANT, SKIN NOS 11/08/2006  . DIABETES MELLITUS II, UNCOMPLICATED 0/93/2355  . SYNDROME, CARPAL TUNNEL 11/08/2006  . PERIPHERAL NEUROPATHY, LOWER EXTREMITY 11/08/2006  . MACULAR DEGENERATION, SENILE, NONEXUDATIVE 01/01/2010  . CATARACTS, SENILE, NUCLEAR, BILATERAL 01/01/2010  . SMALL BOWEL OBSTRUCTION 11/05/2008  . INGROWN TOENAIL, INFECTED 04/10/2008  . OSTEOARTHRITIS, LOWER LEG 06/24/2006  . MUSCLE SPASM, TRAPEZIUS 04/02/2010  . Hyperpotassemia    Past Surgical History  Procedure Laterality Date  . Carpal tunnel release    . Appendectomy    . Abdominal hysterectomy    . Oophorectomy    . Tonsillectomy    .  Spinal fusion      status post at least three back surgeries   Family History  Problem Relation Age of Onset  . Diabetes Sister   . Parkinsonism Sister    History  Substance Use Topics  . Smoking status: Former Smoker    Quit date: 04/27/1978  . Smokeless tobacco: Not on file  . Alcohol Use: No   OB History   Grav Para Term Preterm Abortions TAB SAB Ect Mult Living                 Review of Systems  Neurological: Positive for dizziness.  All other systems reviewed and are negative.     Allergies  Beta adrenergic blockers; Cephalexin; Darvon; Latex; Penicillins; and Warfarin sodium  Home Medications   Prior to Admission medications   Medication Sig Start Date End Date Taking? Authorizing Provider  acetaminophen (TYLENOL) 500 MG tablet 2 by mouth three times a day    Yes Historical Provider, MD  aspirin EC 325 MG tablet Take 1 tablet (325 mg total) by mouth daily. 03/20/13  Yes Lelon Perla, MD  atorvastatin (LIPITOR) 20 MG tablet TAKE 1 TABLET BY MOUTH AT BEDTIME 02/02/13  Yes Midge Minium, MD  bumetanide (BUMEX) 1 MG tablet TAKE 1 TABLET BY MOUTH TWICE DAILY 02/02/13  Yes Midge Minium, MD  Calcium 500 MG tablet 1 tablet by mouth once daily    Yes Historical Provider, MD  cetirizine (ZYRTEC) 10 MG tablet Take 10 mg by mouth daily as  needed.    Yes Historical Provider, MD  cloNIDine (CATAPRES) 0.3 MG tablet TAKE 1 TABLET BY MOUTH TWICE DAILY 05/27/13  Yes Midge Minium, MD  cyclobenzaprine (FLEXERIL) 10 MG tablet Take 10 mg by mouth 2 (two) times daily as needed. For back pain.    Yes Historical Provider, MD  diltiazem (TAZTIA XT) 300 MG 24 hr capsule Take 1 capsule (300 mg total) by mouth daily. 02/02/13  Yes Midge Minium, MD  fenofibrate 160 MG tablet TAKE 1 TABLET BY MOUTH ONCE DAILY 02/02/13  Yes Midge Minium, MD  fluocinonide cream (LIDEX) 0.05 % Apply to affected area twice daily as needed. Don't se on face or underarm. 06/09/13  Yes  Historical Provider, MD  gabapentin (NEURONTIN) 100 MG capsule TAKE ONE CAPSULE BY MOUTH THREE TIMES DAILY   Yes Midge Minium, MD  Glucosamine-Chondroitin (OSTEO BI-FLEX REGULAR STRENGTH) 250-200 MG TABS Take 2 tablets by mouth daily.     Yes Historical Provider, MD  glucose blood (ONE TOUCH ULTRA TEST) test strip Use as directed twice daily dx 250.00 02/02/13  Yes Midge Minium, MD  hydrALAZINE (APRESOLINE) 50 MG tablet TAKE 1 TABLET BY MOUTH THREE TIMES DAILY 09/08/13  Yes Midge Minium, MD  insulin NPH (NOVOLIN N RELION) 100 UNIT/ML injection INJECT 30 UNITS INTO THE SKIN AT BEDTIME DAILY. 03/15/13  Yes Midge Minium, MD  insulin regular (NOVOLIN R RELION) 100 units/mL injection INJECT 15 UNITS BEFORE BREAKFAST, THEN INJECT 20 UNITS BEFORE LUNCH, THEN 30 UNITS BEFORE DINNER 03/15/13  Yes Midge Minium, MD  Insulin Syringe-Needle U-100 [facility] INSULIN SYRINGE) 31G X 3/8" 0.3 ML MISC Any brand, four times a day 02/02/13  Yes Midge Minium, MD  Lancets [facility] ULTRASOFT) lancets Use as instructed 02/02/13  Yes Midge Minium, MD  losartan-hydrochlorothiazide (HYZAAR) 100-12.5 MG per tablet TAKE 1 TABLET BY MOUTH DAILY 05/27/13  Yes Midge Minium, MD  Multiple Vitamins-Minerals (PRESERVISION AREDS 2 PO) Take 2 capsules by mouth daily.    Yes Historical Provider, MD  nystatin-triamcinolone ointment (MYCOLOG) Apply 1 application topically 2 (two) times daily. 10/06/12  Yes Midge Minium, MD  ondansetron (ZOFRAN) 8 MG tablet Take 1 tablet (8 mg total) by mouth every 8 (eight) hours as needed. For nausea 05/25/12  Yes Midge Minium, MD  polyethylene glycol (MIRALAX) powder As needed    Yes Historical Provider, MD   BP 176/63  Pulse 72  Resp 21  SpO2 100% Physical Exam  Nursing note and vitals reviewed.  Morbidly obese 80 year old female, resting comfortably and in no acute distress. Vital signs are significant for hypertension with blood pressure 176/63,  and borderline tachypnea with respiratory rate of 21. Oxygen saturation is 100%, which is normal. Head is normocephalic and atraumatic. PERRLA, EOMI. Oropharynx is clear. Neck is nontender and supple without adenopathy or JVD. Back is mildly tender in the mid and lower lumbar area and moderately to severely tender of the sacral and coccygeal area. There is no CVA tenderness. Lungs are clear without rales, wheezes, or rhonchi. Chest is nontender. Heart has regular rate and rhythm without murmur. Abdomen is soft, flat, nontender without masses or hepatosplenomegaly and peristalsis is normoactive. Extremities have 1-2+ edema, full range of motion is present. Skin is warm and dry without rash. Neurologic: Mental status is normal, cranial nerves are intact, there are no motor or sensory deficits.  ED Course  Procedures (including critical care time) Labs Review Results for orders  placed during the hospital encounter of 10/27/13  CBC      Result Value Ref Range   WBC 11.8 (*) 4.0 - 10.5 K/uL   RBC 4.34  3.87 - 5.11 MIL/uL   Hemoglobin 12.7  12.0 - 15.0 g/dL   HCT 38.7  36.0 - 46.0 %   MCV 89.2  78.0 - 100.0 fL   MCH 29.3  26.0 - 34.0 pg   MCHC 32.8  30.0 - 36.0 g/dL   RDW 14.3  11.5 - 15.5 %   Platelets 401 (*) 150 - 400 K/uL  I-STAT CHEM 8, ED      Result Value Ref Range   Sodium 142  137 - 147 mEq/L   Potassium 3.5 (*) 3.7 - 5.3 mEq/L   Chloride 105  96 - 112 mEq/L   BUN 46 (*) 6 - 23 mg/dL   Creatinine, Ser 1.60 (*) 0.50 - 1.10 mg/dL   Glucose, Bld 222 (*) 70 - 99 mg/dL   Calcium, Ion 1.26  1.13 - 1.30 mmol/L   TCO2 24  0 - 100 mmol/L   Hemoglobin 14.3  12.0 - 15.0 g/dL   HCT 42.0  36.0 - 46.0 %   Imaging Review Dg Lumbar Spine Complete  10/27/2013   CLINICAL DATA:  Fall, low back pain  EXAM: LUMBAR SPINE - COMPLETE 4+ VIEW  COMPARISON:  CT abdomen pelvis dated 10/24/2009  FINDINGS: Five lumbar type vertebral bodies.  Loss of the normal lumbar lordosis.  Mild lumbar  levoscoliosis.  No evidence of fracture or dislocation. Vertebral body heights are maintained. Moderate multilevel degenerative changes.  Visualized bony pelvis appears intact.  Vascular calcifications.  IMPRESSION: No fracture or dislocation is seen.  Moderate multilevel degenerative changes.   Electronically Signed   By: Julian Hy M.D.   On: 10/27/2013 20:53   Dg Sacrum/coccyx  10/27/2013   CLINICAL DATA:  Fall, low back/ sacral pain  EXAM: SACRUM AND COCCYX - 2+ VIEW  COMPARISON:  None.  FINDINGS: There is no evidence of fracture or other focal bone lesions.  Specifically, no displaced sacrococcygeal fracture is seen.  IMPRESSION: Negative.   Electronically Signed   By: Julian Hy M.D.   On: 10/27/2013 20:53     EKG Interpretation   Date/Time:  Friday October 27 2013 18:08:53 EDT Ventricular Rate:  70 PR Interval:  214 QRS Duration: 92 QT Interval:  334 QTC Calculation: 360 R Axis:   3 Text Interpretation:  Sinus rhythm with 1st degree A-V block ST \\T \ T wave  abnormality, consider inferior ischemia Abnormal ECG When compared with  ECG of 10/27/2013, No significant change was found Confirmed by [facility]  MD,  Eren Ryser ([id number]) on 10/27/2013 7:38:43 PM      MDM   Final diagnoses:  Fall at home, initial encounter  Contusion of coccyx, initial encounter    Fall with injury to sacrum and coccyx. Patient states that she is intolerant of all codeine derivatives. She's given an injection of morphine to get adequate pain relief so that x-rays can be obtained.  X-rays are negative for fracture. She is discharged with prescription for ondansetron for nausea, and tramadol for pain. I have expressed to the patient my concern of tramadol may not be strong enough to give her adequate pain relief. However, she has a strong reactions to all the codeine-based analgesics. If she is not getting adequate pain reliever tramadol, she will need to discuss with her PCP an appropriate  alternative.  Delora Fuel, MD 70/92/95 [id number]

## 2013-10-27 NOTE — ED Notes (Signed)
Per Patient: Patient report she was ambulating to restroom when she became dizzy, pt reports falling backwards. Pt reports she landed on her tailbone, denies striking head on any objects. Pt reports pain in her tailbone. AX4, NAD.

## 2013-10-30 ENCOUNTER — Telehealth: Payer: Self-pay | Admitting: Family Medicine

## 2013-10-30 NOTE — Telephone Encounter (Signed)
Caller name: Jennie  Call back number: [phone]   Reason for call:  Pt fell on 7/3 and was at ED.  Got RX Tramadol, but states that it is not working.  Pt can not ride in the car due to pain, and can not come in for office visit.  Pt is wanting something stronger.  Please advise.

## 2013-10-31 MED ORDER — OXYCODONE HCL 5 MG PO CAPS: 5.0000 mg | ORAL_CAPSULE | Freq: Three times a day (TID) | ORAL | Status: DC

## 2013-10-31 MED ORDER — HYDROCODONE-ACETAMINOPHEN 5-325 MG PO TABS: 1.0000 | ORAL_TABLET | Freq: Three times a day (TID) | ORAL | Status: DC

## 2013-10-31 NOTE — Telephone Encounter (Signed)
Spouse came in to pick up Rx and called who states that she cannot take codeine. Advised patient over the phone of Dr Virgil Benedict research. Wanted something for nausea. Spoke with spouse who states that she has the Zofran 4mg  from the hospital. Advised that she could use those for the nausea and if she needs additional to let us know. Spouse verbalizes understanding.

## 2013-10-31 NOTE — Telephone Encounter (Signed)
Pt can have script for Oxycodone 5mg  TID prn severe pain, #30, no refills.  Is not codeine or a codeine metabolite (although morphine is).  Hope this helps

## 2013-10-31 NOTE — Telephone Encounter (Signed)
Patient's spouse called stating that his wife is in severe pain and unable to sleep at all. Patient cannot tolerate hydrocodone (was not on list but has been added).   What can we fill for her??

## 2013-10-31 NOTE — Telephone Encounter (Signed)
Please call pt and ask them what she can take- is she intolerant to all narcotics?  If so, there is not much other than tramadol that can be done.

## 2013-10-31 NOTE — Telephone Encounter (Signed)
Patient states that she cannot tolerate anything with codeine. She was given morphine in ED and tolerated ok. States it was late in the evening and she just went to sleep.

## 2013-10-31 NOTE — Telephone Encounter (Signed)
Advised spouse who will have his son bring him to pick it up.

## 2013-10-31 NOTE — Telephone Encounter (Signed)
[facility] for Hydrocodone 5/325mg  TID prn pain.  #60, no refills

## 2013-10-31 NOTE — Addendum Note (Signed)
Addended by: Kris Hartmann on: 10/31/2013 11:58 AM   Modules accepted: Orders

## 2013-10-31 NOTE — Telephone Encounter (Signed)
Med filled.  

## 2013-11-01 ENCOUNTER — Other Ambulatory Visit: Payer: Self-pay | Admitting: Family Medicine

## 2013-11-01 ENCOUNTER — Telehealth: Payer: Self-pay | Admitting: Family Medicine

## 2013-11-01 NOTE — Telephone Encounter (Signed)
Can we send in more zofran for patient?

## 2013-11-01 NOTE — Telephone Encounter (Signed)
Med filled.  

## 2013-11-01 NOTE — Telephone Encounter (Signed)
Ok for Zofran, #30, no refills

## 2013-11-01 NOTE — Telephone Encounter (Signed)
Caller name: Margrett  Call back number: [phone] Pharmacy:  Reason for call:  Pt is calling because she needs more of the Zofran medication.  Only has 2 left.  See previous messages.

## 2013-11-02 NOTE — Telephone Encounter (Signed)
Med was filled on 11/01/13.

## 2013-11-07 ENCOUNTER — Telehealth: Payer: Self-pay | Admitting: Family Medicine

## 2013-11-07 NOTE — Telephone Encounter (Signed)
Pt husband notified, expressed an understanding. Pt stopped pain meds 36 hours ago.

## 2013-11-07 NOTE — Telephone Encounter (Signed)
Spoke with pt's husband who states that she has been in "severe agony" since 7/3. Pt has tried miralax, ducolax, and 1/2 glass of prune juice to no avail. Please advise.

## 2013-11-07 NOTE — Telephone Encounter (Signed)
Cut back on pain meds as able or stop entirely. Increase Miralax to twice daily Pt can take 2-3 of the 5mg  Dulcolax tabs daily as needed Next step is Fleets Enema

## 2013-11-07 NOTE — Telephone Encounter (Signed)
Caller name: Juanda Crumble Relation to pt: husband Call back number: [phone] Pharmacy:  Reason for call: Patient's husband called stating that his wife is constipated and is in a great deal of pain. Husband thinks that the oxycodone is causing constipation.  Please advise.

## 2013-11-08 ENCOUNTER — Encounter (HOSPITAL_COMMUNITY): Payer: Self-pay | Admitting: Emergency Medicine

## 2013-11-08 ENCOUNTER — Emergency Department (HOSPITAL_COMMUNITY): Payer: Medicare Other

## 2013-11-08 ENCOUNTER — Inpatient Hospital Stay (HOSPITAL_COMMUNITY)
Admission: EM | Admit: 2013-11-08 | Discharge: 2013-11-09 | DRG: 392 | Disposition: A | Discharge status: Home / Self Care | Payer: Medicare Other | Attending: Internal Medicine | Admitting: Internal Medicine

## 2013-11-08 DIAGNOSIS — K5289 Other specified noninfective gastroenteritis and colitis: Principal | ICD-10-CM | POA: Diagnosis present

## 2013-11-08 DIAGNOSIS — E1149 Type 2 diabetes mellitus with other diabetic neurological complication: Secondary | ICD-10-CM | POA: Diagnosis present

## 2013-11-08 DIAGNOSIS — M549 Dorsalgia, unspecified: Secondary | ICD-10-CM | POA: Diagnosis present

## 2013-11-08 DIAGNOSIS — Z66 Do not resuscitate: Secondary | ICD-10-CM | POA: Diagnosis present

## 2013-11-08 DIAGNOSIS — K529 Noninfective gastroenteritis and colitis, unspecified: Secondary | ICD-10-CM

## 2013-11-08 DIAGNOSIS — K5900 Constipation, unspecified: Secondary | ICD-10-CM | POA: Diagnosis present

## 2013-11-08 DIAGNOSIS — Z91040 Latex allergy status: Secondary | ICD-10-CM

## 2013-11-08 DIAGNOSIS — Z833 Family history of diabetes mellitus: Secondary | ICD-10-CM

## 2013-11-08 DIAGNOSIS — M1710 Unilateral primary osteoarthritis, unspecified knee: Secondary | ICD-10-CM | POA: Diagnosis present

## 2013-11-08 DIAGNOSIS — Z981 Arthrodesis status: Secondary | ICD-10-CM | POA: Diagnosis not present

## 2013-11-08 DIAGNOSIS — G8929 Other chronic pain: Secondary | ICD-10-CM | POA: Diagnosis present

## 2013-11-08 DIAGNOSIS — Z794 Long term (current) use of insulin: Secondary | ICD-10-CM | POA: Diagnosis not present

## 2013-11-08 DIAGNOSIS — I480 Paroxysmal atrial fibrillation: Secondary | ICD-10-CM

## 2013-11-08 DIAGNOSIS — E1142 Type 2 diabetes mellitus with diabetic polyneuropathy: Secondary | ICD-10-CM | POA: Diagnosis present

## 2013-11-08 DIAGNOSIS — N183 Chronic kidney disease, stage 3 unspecified: Secondary | ICD-10-CM | POA: Diagnosis present

## 2013-11-08 DIAGNOSIS — Z7982 Long term (current) use of aspirin: Secondary | ICD-10-CM

## 2013-11-08 DIAGNOSIS — R1084 Generalized abdominal pain: Secondary | ICD-10-CM

## 2013-11-08 DIAGNOSIS — R140 Abdominal distension (gaseous): Secondary | ICD-10-CM

## 2013-11-08 DIAGNOSIS — I4891 Unspecified atrial fibrillation: Secondary | ICD-10-CM | POA: Diagnosis present

## 2013-11-08 DIAGNOSIS — I129 Hypertensive chronic kidney disease with stage 1 through stage 4 chronic kidney disease, or unspecified chronic kidney disease: Secondary | ICD-10-CM | POA: Diagnosis present

## 2013-11-08 DIAGNOSIS — R109 Unspecified abdominal pain: Secondary | ICD-10-CM | POA: Diagnosis present

## 2013-11-08 DIAGNOSIS — E119 Type 2 diabetes mellitus without complications: Secondary | ICD-10-CM | POA: Diagnosis present

## 2013-11-08 DIAGNOSIS — G5790 Unspecified mononeuropathy of unspecified lower limb: Secondary | ICD-10-CM | POA: Diagnosis present

## 2013-11-08 DIAGNOSIS — D72829 Elevated white blood cell count, unspecified: Secondary | ICD-10-CM

## 2013-11-08 DIAGNOSIS — I10 Essential (primary) hypertension: Secondary | ICD-10-CM

## 2013-11-08 DIAGNOSIS — H3530 Unspecified macular degeneration: Secondary | ICD-10-CM | POA: Diagnosis present

## 2013-11-08 DIAGNOSIS — N189 Chronic kidney disease, unspecified: Secondary | ICD-10-CM

## 2013-11-08 DIAGNOSIS — Z880 Allergy status to penicillin: Secondary | ICD-10-CM | POA: Diagnosis not present

## 2013-11-08 DIAGNOSIS — H269 Unspecified cataract: Secondary | ICD-10-CM | POA: Diagnosis present

## 2013-11-08 DIAGNOSIS — E7800 Pure hypercholesterolemia, unspecified: Secondary | ICD-10-CM | POA: Diagnosis present

## 2013-11-08 DIAGNOSIS — R110 Nausea: Secondary | ICD-10-CM

## 2013-11-08 DIAGNOSIS — Z885 Allergy status to narcotic agent status: Secondary | ICD-10-CM | POA: Diagnosis not present

## 2013-11-08 DIAGNOSIS — Z87891 Personal history of nicotine dependence: Secondary | ICD-10-CM

## 2013-11-08 DIAGNOSIS — Z90710 Acquired absence of both cervix and uterus: Secondary | ICD-10-CM

## 2013-11-08 DIAGNOSIS — E1140 Type 2 diabetes mellitus with diabetic neuropathy, unspecified: Secondary | ICD-10-CM

## 2013-11-08 DIAGNOSIS — I482 Chronic atrial fibrillation, unspecified: Secondary | ICD-10-CM

## 2013-11-08 HISTORY — DX: Cardiac arrhythmia, unspecified: I49.9

## 2013-11-08 LAB — COMPREHENSIVE METABOLIC PANEL
ALK PHOS: 67 U/L (ref 39–117)
ALT: 13 U/L (ref 0–35)
ANION GAP: 22 — AB (ref 5–15)
AST: 14 U/L (ref 0–37)
Albumin: 3.3 g/dL — ABNORMAL LOW (ref 3.5–5.2)
BILIRUBIN TOTAL: 0.4 mg/dL (ref 0.3–1.2)
BUN: 45 mg/dL — AB (ref 6–23)
CHLORIDE: 94 meq/L — AB (ref 96–112)
CO2: 22 mEq/L (ref 19–32)
Calcium: 10.4 mg/dL (ref 8.4–10.5)
Creatinine, Ser: 1.55 mg/dL — ABNORMAL HIGH (ref 0.50–1.10)
GFR calc non Af Amer: 30 mL/min — ABNORMAL LOW (ref >90)
GFR, EST AFRICAN AMERICAN: 35 mL/min — AB (ref >90)
GLUCOSE: 251 mg/dL — AB (ref 70–99)
Potassium: 3.6 mEq/L — ABNORMAL LOW (ref 3.7–5.3)
Sodium: 138 mEq/L (ref 137–147)
TOTAL PROTEIN: 7.5 g/dL (ref 6.0–8.3)

## 2013-11-08 LAB — CBC WITH DIFFERENTIAL/PLATELET
BASOS PCT: 0 % (ref 0–1)
Basophils Absolute: 0 10*3/uL (ref 0.0–0.1)
EOS ABS: 0 10*3/uL (ref 0.0–0.7)
Eosinophils Relative: 0 % (ref 0–5)
HEMATOCRIT: 38.7 % (ref 36.0–46.0)
HEMOGLOBIN: 12.2 g/dL (ref 12.0–15.0)
Lymphocytes Relative: 5 % — ABNORMAL LOW (ref 12–46)
Lymphs Abs: 1.1 10*3/uL (ref 0.7–4.0)
MCH: 28.3 pg (ref 26.0–34.0)
MCHC: 31.5 g/dL (ref 30.0–36.0)
MCV: 89.8 fL (ref 78.0–100.0)
MONOS PCT: 6 % (ref 3–12)
Monocytes Absolute: 1.1 10*3/uL — ABNORMAL HIGH (ref 0.1–1.0)
Neutro Abs: 18.3 10*3/uL — ABNORMAL HIGH (ref 1.7–7.7)
Neutrophils Relative %: 89 % — ABNORMAL HIGH (ref 43–77)
Platelets: 483 10*3/uL — ABNORMAL HIGH (ref 150–400)
RBC: 4.31 MIL/uL (ref 3.87–5.11)
RDW: 14.3 % (ref 11.5–15.5)
WBC: 20.5 10*3/uL — ABNORMAL HIGH (ref 4.0–10.5)

## 2013-11-08 LAB — LIPASE, BLOOD: LIPASE: 16 U/L (ref 11–59)

## 2013-11-08 LAB — GLUCOSE, CAPILLARY
Glucose-Capillary: 282 mg/dL — ABNORMAL HIGH (ref 70–99)
Glucose-Capillary: 236 mg/dL — ABNORMAL HIGH (ref 70–99)

## 2013-11-08 MED ORDER — TRAMADOL HCL 50 MG PO TABS: 50.0000 mg | ORAL_TABLET | Freq: Once | ORAL | Status: DC

## 2013-11-08 MED ORDER — MORPHINE SULFATE 2 MG/ML IJ SOLN
2.0000 mg | INTRAMUSCULAR | Status: DC | PRN
  Filled 2013-11-08: qty 1

## 2013-11-08 MED ORDER — IOHEXOL 300 MG/ML  SOLN
80.0000 mL | Freq: Once | INTRAMUSCULAR | Status: AC | PRN
  Administered 2013-11-08: 80 mL via INTRAVENOUS

## 2013-11-08 MED ORDER — ONDANSETRON HCL 4 MG/2ML IJ SOLN
4.0000 mg | Freq: Four times a day (QID) | INTRAMUSCULAR | Status: DC | PRN
Start: 2013-11-08 — End: 2013-11-09

## 2013-11-08 MED ORDER — CLONIDINE HCL 0.3 MG PO TABS
0.3000 mg | ORAL_TABLET | Freq: Two times a day (BID) | ORAL | Status: DC
  Administered 2013-11-08 – 2013-11-09 (×3): 0.3 mg via ORAL
  Filled 2013-11-08 (×4): qty 1

## 2013-11-08 MED ORDER — DILTIAZEM HCL ER COATED BEADS 300 MG PO CP24
300.0000 mg | ORAL_CAPSULE | Freq: Every day | ORAL | Status: DC
  Administered 2013-11-08 – 2013-11-09 (×2): 300 mg via ORAL
  Filled 2013-11-08 (×2): qty 1

## 2013-11-08 MED ORDER — HYDRALAZINE HCL 50 MG PO TABS
50.0000 mg | ORAL_TABLET | Freq: Three times a day (TID) | ORAL | Status: DC
  Administered 2013-11-08 – 2013-11-09 (×4): 50 mg via ORAL
  Filled 2013-11-08 (×6): qty 1

## 2013-11-08 MED ORDER — ENOXAPARIN SODIUM 30 MG/0.3ML ~~LOC~~ SOLN
30.0000 mg | SUBCUTANEOUS | Status: DC
  Filled 2013-11-08 (×2): qty 0.3

## 2013-11-08 MED ORDER — DICYCLOMINE HCL 10 MG PO CAPS
10.0000 mg | ORAL_CAPSULE | Freq: Once | ORAL | Status: AC
  Administered 2013-11-08: 10 mg via ORAL
  Filled 2013-11-08: qty 1

## 2013-11-08 MED ORDER — ATORVASTATIN CALCIUM 20 MG PO TABS
20.0000 mg | ORAL_TABLET | Freq: Every day | ORAL | Status: DC
  Administered 2013-11-08: 20 mg via ORAL
  Filled 2013-11-08 (×2): qty 1

## 2013-11-08 MED ORDER — FENOFIBRATE 160 MG PO TABS
160.0000 mg | ORAL_TABLET | Freq: Every evening | ORAL | Status: DC
  Administered 2013-11-08: 160 mg via ORAL
  Filled 2013-11-08 (×2): qty 1

## 2013-11-08 MED ORDER — INSULIN NPH (HUMAN) (ISOPHANE) 100 UNIT/ML ~~LOC~~ SUSP
20.0000 [IU] | Freq: Every day | SUBCUTANEOUS | Status: DC
Start: 2013-11-08 — End: 2013-11-09
  Administered 2013-11-08: 20 [IU] via SUBCUTANEOUS
  Filled 2013-11-08: qty 10

## 2013-11-08 MED ORDER — POLYETHYLENE GLYCOL 3350 17 G PO PACK
17.0000 g | PACK | Freq: Two times a day (BID) | ORAL | Status: DC
  Administered 2013-11-08 – 2013-11-09 (×2): 17 g via ORAL
  Filled 2013-11-08 (×3): qty 1

## 2013-11-08 MED ORDER — IOHEXOL 300 MG/ML  SOLN
25.0000 mL | Freq: Once | INTRAMUSCULAR | Status: AC | PRN
  Administered 2013-11-08: 25 mL via ORAL

## 2013-11-08 MED ORDER — ONDANSETRON HCL 4 MG/2ML IJ SOLN
4.0000 mg | Freq: Once | INTRAMUSCULAR | Status: AC
  Administered 2013-11-08: 4 mg via INTRAVENOUS
  Filled 2013-11-08: qty 2

## 2013-11-08 MED ORDER — HYDRALAZINE HCL 20 MG/ML IJ SOLN
2.0000 mg | Freq: Once | INTRAMUSCULAR | Status: DC
  Filled 2013-11-08: qty 1

## 2013-11-08 MED ORDER — CIPROFLOXACIN IN D5W 400 MG/200ML IV SOLN
400.0000 mg | Freq: Two times a day (BID) | INTRAVENOUS | Status: DC
  Administered 2013-11-08 – 2013-11-09 (×3): 400 mg via INTRAVENOUS
  Filled 2013-11-08 (×4): qty 200

## 2013-11-08 MED ORDER — METRONIDAZOLE IN NACL 5-0.79 MG/ML-% IV SOLN
500.0000 mg | Freq: Three times a day (TID) | INTRAVENOUS | Status: DC
  Administered 2013-11-08 – 2013-11-09 (×3): 500 mg via INTRAVENOUS
  Filled 2013-11-08 (×5): qty 100

## 2013-11-08 MED ORDER — SODIUM CHLORIDE 0.9 % IV SOLN: INTRAVENOUS | Status: DC

## 2013-11-08 MED ORDER — MORPHINE SULFATE 4 MG/ML IJ SOLN
4.0000 mg | Freq: Once | INTRAMUSCULAR | Status: AC
  Administered 2013-11-08: 4 mg via INTRAVENOUS
  Filled 2013-11-08: qty 1

## 2013-11-08 MED ORDER — DOCUSATE SODIUM 100 MG PO CAPS
100.0000 mg | ORAL_CAPSULE | Freq: Once | ORAL | Status: AC
  Administered 2013-11-08: 100 mg via ORAL
  Filled 2013-11-08: qty 1

## 2013-11-08 MED ORDER — FLEET ENEMA 7-19 GM/118ML RE ENEM
1.0000 | ENEMA | Freq: Once | RECTAL | Status: AC
  Administered 2013-11-08: 1 via RECTAL
  Filled 2013-11-08: qty 1

## 2013-11-08 MED ORDER — SENNOSIDES-DOCUSATE SODIUM 8.6-50 MG PO TABS
1.0000 | ORAL_TABLET | Freq: Two times a day (BID) | ORAL | Status: DC
  Administered 2013-11-08 – 2013-11-09 (×3): 1 via ORAL
  Filled 2013-11-08 (×3): qty 1

## 2013-11-08 MED ORDER — SODIUM CHLORIDE 0.9 % IV SOLN
INTRAVENOUS | Status: DC
  Administered 2013-11-08: 75 mL/h via INTRAVENOUS
  Administered 2013-11-09: 06:00:00 via INTRAVENOUS

## 2013-11-08 MED ORDER — MORPHINE SULFATE 4 MG/ML IJ SOLN
4.0000 mg | Freq: Once | INTRAMUSCULAR | Status: AC
  Administered 2013-11-08: 4 mg via INTRAMUSCULAR
  Filled 2013-11-08: qty 1

## 2013-11-08 MED ORDER — MORPHINE SULFATE 2 MG/ML IJ SOLN
2.0000 mg | INTRAMUSCULAR | Status: DC | PRN
  Administered 2013-11-08: 2 mg via INTRAVENOUS

## 2013-11-08 MED ORDER — HYDRALAZINE HCL 20 MG/ML IJ SOLN: 10.0000 mg | Freq: Four times a day (QID) | INTRAMUSCULAR | Status: DC | PRN

## 2013-11-08 MED ORDER — INSULIN ASPART 100 UNIT/ML ~~LOC~~ SOLN
0.0000 [IU] | Freq: Three times a day (TID) | SUBCUTANEOUS | Status: DC
  Administered 2013-11-08: 8 [IU] via SUBCUTANEOUS

## 2013-11-08 MED ORDER — ASPIRIN EC 81 MG PO TBEC
81.0000 mg | DELAYED_RELEASE_TABLET | Freq: Every day | ORAL | Status: DC
  Administered 2013-11-08 – 2013-11-09 (×2): 81 mg via ORAL
  Filled 2013-11-08 (×2): qty 1

## 2013-11-08 NOTE — H&P (Signed)
[facility] History and Physical  Shelia Woods XIP:[id number] DOB: 07/04/1930 DOA: 11/08/2013  Referring physician: EDP PCP: Annye Asa, MD   Chief Complaint: Abd pain/constipation  HPI: Shelia Woods is a 80 y.o. female with PMH of DM, HTN, AFib, CKD, chronic back pain, sustained a fall and subsequently had severe low back/tail bone pain on 7/3, seen in the ER, discharged home after negative Xrays on Tramadol this was changed to Oxycodone few days later by PCP due to uncontrolled pain. Subsequently developed constipation over the last week with increasing abd distension, nausea, dry heaves, was started on Miralax with no benefit, since yesterday  Has been unable to eat anything and due to worsening pain presented to the ER today. In ER, WBC 20K, Ct with mild colitis and no SBO. She denies any fever, no recent Abx use.    Review of Systems: positives bolded Constitutional:  No weight loss, night sweats, Fevers, chills, fatigue.  HEENT:  No headaches, Difficulty swallowing,Tooth/dental problems,Sore throat,  No sneezing, itching, ear ache, nasal congestion, post nasal drip,  Cardio-vascular:  No chest pain, Orthopnea, PND, swelling in lower extremities, anasarca, dizziness, palpitations  GI:  No heartburn, indigestion, abdominal pain, nausea, vomiting, diarrhea, change in bowel habits, loss of appetite  Resp:  No shortness of breath with exertion or at rest. No excess mucus, no productive cough, No non-productive cough, No coughing up of blood.No change in color of mucus.No wheezing.No chest wall deformity  Skin:  no rash or lesions.  GU:  no dysuria, change in color of urine, no urgency or frequency. No flank pain.  Musculoskeletal:  No joint pain or swelling. No decreased range of motion. No back pain.  Psych:  No change in mood or affect. No depression or anxiety. No memory loss.   Past Medical History  Diagnosis Date  . PAROXYSMAL ATRIAL FIBRILLATION 09/29/2008    . HYPERCHOLESTEROLEMIA 06/24/2006  . HYPERTENSION, BENIGN SYSTEMIC 06/24/2006  . FATIGUE 11/22/2007  . RENAL FAILURE 11/22/2007  . RENAL INSUFFICIENCY, CHRONIC 04/10/2008  . Proteinuria 05/17/2008  . Dermatophytosis of the body 11/30/2007  . NEOP, MALIGNANT, SKIN NOS 11/08/2006  . DIABETES MELLITUS II, UNCOMPLICATED 6/73/4193  . SYNDROME, CARPAL TUNNEL 11/08/2006  . PERIPHERAL NEUROPATHY, LOWER EXTREMITY 11/08/2006  . MACULAR DEGENERATION, SENILE, NONEXUDATIVE 01/01/2010  . CATARACTS, SENILE, NUCLEAR, BILATERAL 01/01/2010  . SMALL BOWEL OBSTRUCTION 11/05/2008  . INGROWN TOENAIL, INFECTED 04/10/2008  . OSTEOARTHRITIS, LOWER LEG 06/24/2006  . MUSCLE SPASM, TRAPEZIUS 04/02/2010  . Hyperpotassemia   . Dysrhythmia     atrial fibrilation   Past Surgical History  Procedure Laterality Date  . Carpal tunnel release    . Appendectomy    . Abdominal hysterectomy    . Oophorectomy    . Tonsillectomy    . Spinal fusion      status post at least three back surgeries   Social History:  reports that she quit smoking about 35 years ago. She has never used smokeless tobacco. She reports that she does not drink alcohol or use illicit drugs.  Allergies  Allergen Reactions  . Beta Adrenergic Blockers     REACTION: depression  . Cephalexin Other (See Comments)    unknown  . Codone [Hydrocodone] Nausea And Vomiting  . Darvon [Propoxyphene]     dizziness  . Penicillins     REACTION: hives/intestinal tract  . Warfarin Sodium     REACTION: bleeding on skin surface  . Latex Rash    Family History  Problem Relation Age  of Onset  . Diabetes Sister   . Parkinsonism Sister      Prior to Admission medications   Medication Sig Start Date End Date Taking? Authorizing Provider  acetaminophen (TYLENOL) 500 MG tablet 1,000 mg 3 (three) times daily. 2 by mouth three times a day   Yes Historical Provider, MD  aspirin EC 81 MG tablet Take 81 mg by mouth daily.   Yes Historical Provider, MD  atorvastatin  (LIPITOR) 20 MG tablet Take 20 mg by mouth at bedtime.   Yes Historical Provider, MD  bumetanide (BUMEX) 1 MG tablet Take 1 mg by mouth 2 (two) times daily.   Yes Historical Provider, MD  Calcium Carbonate (CALCIUM 600 PO) Take 600 mg by mouth every evening.   Yes Historical Provider, MD  cetirizine (ZYRTEC) 10 MG tablet Take 10 mg by mouth daily as needed for allergies.    Yes Historical Provider, MD  cloNIDine (CATAPRES) 0.3 MG tablet Take 0.3 mg by mouth 2 (two) times daily.   Yes Historical Provider, MD  diltiazem (TAZTIA XT) 300 MG 24 hr capsule Take 1 capsule (300 mg total) by mouth daily. 02/02/13  Yes Midge Minium, MD  fenofibrate 160 MG tablet Take 160 mg by mouth every evening.   Yes Historical Provider, MD  Glucosamine-Chondroitin (OSTEO BI-FLEX REGULAR STRENGTH) 250-200 MG TABS Take 1 tablet by mouth 2 (two) times daily.    Yes Historical Provider, MD  hydrALAZINE (APRESOLINE) 50 MG tablet Take 50 mg by mouth every 8 (eight) hours.   Yes Historical Provider, MD  insulin NPH Human (HUMULIN N,NOVOLIN N) 100 UNIT/ML injection Inject 30 Units into the skin at bedtime.   Yes Historical Provider, MD  insulin regular (NOVOLIN R,HUMULIN R) 100 units/mL injection Inject 15-30 Units into the skin 3 (three) times daily before meals. 15 units in the morning, 20 units midday and 30 units in the evening   Yes Historical Provider, MD  losartan-hydrochlorothiazide (HYZAAR) 100-12.5 MG per tablet Take 1 tablet by mouth every morning.   Yes Historical Provider, MD  Multiple Vitamins-Minerals (PRESERVISION AREDS 2 PO) Take 1 capsule by mouth 2 (two) times daily.    Yes Historical Provider, MD  nystatin-triamcinolone ointment (MYCOLOG) Apply 1 application topically 2 (two) times daily. 10/06/12  Yes Midge Minium, MD  polyethylene glycol (MIRALAX / GLYCOLAX) packet Take 17 g by mouth 2 (two) times daily as needed for mild constipation.   Yes Historical Provider, MD   Physical Exam: Filed Vitals:    11/08/13 1058  BP: 199/64  Pulse: 81  Temp: 98.7 F (37.1 C)  Resp: 16    BP 199/64  Pulse 81  Temp(Src) 98.7 F (37.1 C) (Oral)  Resp 16  Ht 5\' 6"  (1.676 m)  SpO2 90%  General:  Appears calm, AAOx3, slightly uncomfortable Eyes: PERRL, normal lids, irises & conjunctiva ENT: grossly normal hearing, lips & tongue Neck: no LAD, masses or thyromegaly Cardiovascular: RRR, no m/r/g. No LE edema. Respiratory: CTA bilaterally, no w/r/r. Normal respiratory effort. Abdomen: soft, distended, BS increased, tender diffusely in lower quadrants and LUQ, no rigidity or rebound Skin: no rash or induration seen on limited exam Musculoskeletal: 1 plus edema, chronic Psychiatric: grossly normal mood and affect, speech fluent and appropriate Neurologic: grossly non-focal.          Labs on Admission:  Basic Metabolic Panel:  Recent Labs Lab 11/08/13 0554  NA 138  K 3.6*  CL 94*  CO2 22  GLUCOSE 251*  BUN 45*  CREATININE 1.55*  CALCIUM 10.4   Liver Function Tests:  Recent Labs Lab 11/08/13 0554  AST 14  ALT 13  ALKPHOS 67  BILITOT 0.4  PROT 7.5  ALBUMIN 3.3*    Recent Labs Lab 11/08/13 0554  LIPASE 16   No results found for this basename: AMMONIA,  in the last 168 hours CBC:  Recent Labs Lab 11/08/13 0554  WBC 20.5*  NEUTROABS 18.3*  HGB 12.2  HCT 38.7  MCV 89.8  PLT 483*   Cardiac Enzymes: No results found for this basename: CKTOTAL, CKMB, CKMBINDEX, TROPONINI,  in the last 168 hours  BNP (last 3 results) No results found for this basename: PROBNP,  in the last 8760 hours CBG: No results found for this basename: GLUCAP,  in the last 168 hours  Radiological Exams on Admission: Ct Abdomen Pelvis W Contrast  11/08/2013   CLINICAL DATA:  80 year old female with severe diffuse abdominal pain nausea and vomiting. Renal insufficiency. Initial encounter.  EXAM: CT ABDOMEN AND PELVIS WITH CONTRAST  TECHNIQUE: Multidetector CT imaging of the abdomen and pelvis  was performed using the standard protocol following bolus administration of intravenous contrast.  CONTRAST:  50mL OMNIPAQUE IOHEXOL 300 MG/ML  SOLN  COMPARISON:  CT Abdomen and Pelvis 10/24/2009.  FINDINGS: Stable cardiomegaly. No pericardial or pleural effusion. Chronic mild elevation of the left hemidiaphragm. Stable lung bases with mild atelectasis.  Advanced degenerative changes in the spine. Previous lumbar spine posterior element surgery with severe posterior element overgrowth. No acute osseous abnormality identified.  No pelvic free fluid. Negative rectum. Decompressed bladder. Similar appearance of asymmetric perineum soft tissue (series 2, image 91) on the left of uncertain significance. Uterus surgically absent. Normal left adnexa. The right also appears to be surgically absent.  Retained stool in the sigmoid colon with occasional diverticula. Mildly indistinct appearance of the sigmoid wall with subtle fat stranding (e.g. Series 2, image 68). Fluid in stool in the left colon. The transverse colon is distended with fluid. The right colon is distended with fluid. No other colonic wall thickening or mesenteric stranding identified.  The distal ileum is decompressed. Oral contrast in mid small bowel loops which appear within normal limits. Minimally distended stomach with oral contrast. Duodenum within normal limits.  No abdominal free fluid or free air. Negative liver. Mildly distended gallbladder, but does not appear inflamed. Diminutive spleen. Pancreas within normal limits. Negative adrenal glands. Stable kidneys with no hydronephrosis or hydroureter. A chronic right greater than left perinephric stranding. Negative ureters.  Portal venous system appears patent. Aortoiliac calcified atherosclerosis noted. Major arterial structures in the abdomen and pelvis are patent.  Small fact containing hernia re- identified at the umbilicus. Superficial soft tissue thickening about the umbilicus appears increased  along with mild regional fat stranding (series 2, images 84-86). No subcutaneous gas or fluid.  IMPRESSION: 1. Possible mild colitis in the sigmoid colon were there is retained stool and occasional diverticula. More proximal colon distended with fluid such as due to diarrhea. No free air or free fluid. 2. Decompressed small bowel, no mechanical bowel obstruction suspected at this time. 3. Cannot exclude mild ventral abdominal wall cellulitis near the emboli kiss.   Electronically Signed   By: Lars Pinks M.D.   On: 11/08/2013 07:35   Dg Abd Acute W/chest  11/08/2013   CLINICAL DATA:  Diffuse abdominal pain.  EXAM: ACUTE ABDOMEN SERIES (ABDOMEN 2 VIEW & CHEST 1 VIEW)  COMPARISON:  10/24/2009 abdominal CT  FINDINGS: There is cardiomegaly, overestimated  by a large fat pad noted on preceding CT. Upper mediastinal contours are unremarkable. There is no edema, consolidation, effusion, or pneumothorax.  The bowel gas pattern is abnormal, with dilated and fluid-filled loops of small bowel. There is stool seen within the descending colon, but small bowel loops are disproportionately distended. Bowel overlaps both hips, likely from abdominal wall laxity rather than hernia. There is no pneumoperitoneum.  IMPRESSION: 1. Abnormal bowel gas pattern, likely small bowel obstruction. 2. No acute intrathoracic abnormality.   Electronically Signed   By: Jorje Guild M.D.   On: 11/08/2013 06:02     Assessment/Plan   1. Colitis/Constipation -check Cdiff PCR although less likely, GI pathogen panel -IV Cipro/Flagyl -Supportive care, clears, advance as tolerated -hold off on enema at this time -add miralax and senokot  2.  Type 2 diabetes -resume home insulin at lower dose and SSI  3. HTN -uncontrolled, due to pain and didn't take am meds -resume Clonidine, Hydralazine, Diltiazem, hold ACE and diuretic  4. CKD (chronic kidney disease), stage III -stable  5. P.Afib -rate controlled, continue diltiazem,  ASA -reportedly not on AC due to intolerance  DVt proph: lovenox  Code Status: DNR Family Communication: d/w spouse at bedside Disposition Plan: inpatient  Time spent: 31min  Phuoc Huy [facility] Pager [phone]  **Disclaimer: This note may have been dictated with voice recognition software. Similar sounding words can inadvertently be transcribed and this note may contain transcription errors which may not have been corrected upon publication of note.**

## 2013-11-08 NOTE — ED Notes (Signed)
Fleets Enema introduced. Pt placed on bedpan. Call light in hand

## 2013-11-08 NOTE — Progress Notes (Signed)
Shelia Woods [id number] Admission Data: 11/08/2013 1:36 PM Attending Provider: Domenic Polite, MD  GTX:[id number] Birdie Riddle, MD Consults/ Treatment Team:    Shelia Woods is a 80 y.o. female patient admitted from ED awake, alert  & orientated  X 3,  DNR, VSS - Blood pressure 199/64, pulse 81, temperature 98.7 F (37.1 C), temperature source Oral, resp. rate 16, height 5\' 6"  (1.676 m), SpO2 90.00%.no c/o shortness of breath, no c/o chest pain, no distress noted.    Allergies:   Allergies  Allergen Reactions  . Beta Adrenergic Blockers     REACTION: depression  . Cephalexin Other (See Comments)    unknown  . Codone [Hydrocodone] Nausea And Vomiting  . Darvon [Propoxyphene]     dizziness  . Penicillins     REACTION: hives/intestinal tract  . Warfarin Sodium     REACTION: bleeding on skin surface  . Latex Rash     Past Medical History  Diagnosis Date  . PAROXYSMAL ATRIAL FIBRILLATION 09/29/2008  . HYPERCHOLESTEROLEMIA 06/24/2006  . HYPERTENSION, BENIGN SYSTEMIC 06/24/2006  . FATIGUE 11/22/2007  . RENAL FAILURE 11/22/2007  . RENAL INSUFFICIENCY, CHRONIC 04/10/2008  . Proteinuria 05/17/2008  . Dermatophytosis of the body 11/30/2007  . NEOP, MALIGNANT, SKIN NOS 11/08/2006  . DIABETES MELLITUS II, UNCOMPLICATED 2/48/2500  . SYNDROME, CARPAL TUNNEL 11/08/2006  . PERIPHERAL NEUROPATHY, LOWER EXTREMITY 11/08/2006  . MACULAR DEGENERATION, SENILE, NONEXUDATIVE 01/01/2010  . CATARACTS, SENILE, NUCLEAR, BILATERAL 01/01/2010  . SMALL BOWEL OBSTRUCTION 11/05/2008  . INGROWN TOENAIL, INFECTED 04/10/2008  . OSTEOARTHRITIS, LOWER LEG 06/24/2006  . MUSCLE SPASM, TRAPEZIUS 04/02/2010  . Hyperpotassemia   . Dysrhythmia     atrial fibrilation     Pt orientation to unit, room and routine. Information packet given to patient/family.  Admission INP armband ID verified with patient/family, and in place. SR up x 2, fall risk assessment complete with Patient and family verbalizing understanding of risks associated  with falls. Pt verbalizes an understanding of how to use the call bell and to call for help before getting out of bed.  One stage two noted to Left buttock, circular and size of a dime. Two small stage II noted to Right buttock, circular and slightly smaller than a pencil eraser tip. Barrier cream applied.    Will cont to monitor and assist as needed.  Dayle Points, RN 11/08/2013 1:36 PM

## 2013-11-08 NOTE — Progress Notes (Signed)
ANTIBIOTIC CONSULT NOTE - INITIAL  Pharmacy Consult for Ciprofloxacin Indication: intraabd process  Allergies  Allergen Reactions  . Beta Adrenergic Blockers     REACTION: depression  . Cephalexin Other (See Comments)    unknown  . Codone [Hydrocodone] Nausea And Vomiting  . Darvon [Propoxyphene]     dizziness  . Penicillins     REACTION: hives/intestinal tract  . Warfarin Sodium     REACTION: bleeding on skin surface  . Latex Rash    Patient Measurements:    Vital Signs: Temp: 97.5 F (36.4 C) (07/15 0443) Temp src: Oral (07/15 0443) BP: 215/58 mmHg (07/15 0700) Pulse Rate: 76 (07/15 0700) Intake/Output from previous day:   Intake/Output from this shift:    Labs:  Recent Labs  11/08/13 0554  WBC 20.5*  HGB 12.2  PLT 483*  CREATININE 1.55*   The CrCl is unknown because both a height and weight (above a minimum accepted value) are required for this calculation. No results found for this basename: VANCOTROUGH, VANCOPEAK, VANCORANDOM, GENTTROUGH, GENTPEAK, GENTRANDOM, TOBRATROUGH, TOBRAPEAK, TOBRARND, AMIKACINPEAK, AMIKACINTROU, AMIKACIN,  in the last 72 hours   Microbiology: No results found for this or any previous visit (from the past 720 hour(s)).  Medical History: Past Medical History  Diagnosis Date  . PAROXYSMAL ATRIAL FIBRILLATION 09/29/2008  . HYPERCHOLESTEROLEMIA 06/24/2006  . HYPERTENSION, BENIGN SYSTEMIC 06/24/2006  . FATIGUE 11/22/2007  . RENAL FAILURE 11/22/2007  . RENAL INSUFFICIENCY, CHRONIC 04/10/2008  . Proteinuria 05/17/2008  . Dermatophytosis of the body 11/30/2007  . NEOP, MALIGNANT, SKIN NOS 11/08/2006  . DIABETES MELLITUS II, UNCOMPLICATED 3/88/8280  . SYNDROME, CARPAL TUNNEL 11/08/2006  . PERIPHERAL NEUROPATHY, LOWER EXTREMITY 11/08/2006  . MACULAR DEGENERATION, SENILE, NONEXUDATIVE 01/01/2010  . CATARACTS, SENILE, NUCLEAR, BILATERAL 01/01/2010  . SMALL BOWEL OBSTRUCTION 11/05/2008  . INGROWN TOENAIL, INFECTED 04/10/2008  . OSTEOARTHRITIS,  LOWER LEG 06/24/2006  . MUSCLE SPASM, TRAPEZIUS 04/02/2010  . Hyperpotassemia     Medications:  See electronic med rec  Assessment: 80 y.o. female presents with constipation, abd distention and pain, nausea. CT shows possible colitis. WBC elevated to 20.5. Afeb. To begin cipro and flagyl for intra-abd process. Noted pt with allergies to PCN and cephalexin.  SCr 1.55, estimated CrCl ~ 35 ml/min.  Goal of Therapy:  Resolution of infection  Plan:  1. Ciprofloxacin 400mg  IV q12h. 2. Will f/u micro data, pt's clinical condition, renal function  Sherlon Handing, PharmD, BCPS Clinical pharmacist, pager [phone] 11/08/2013,8:44 AM

## 2013-11-08 NOTE — Progress Notes (Signed)
Inpatient Diabetes Program Recommendations  AACE/ADA: New Consensus Statement on Inpatient Glycemic Control (2013)  Target Ranges:  Prepandial:   less than 140 mg/dL      Peak postprandial:   less than 180 mg/dL (1-2 hours)      Critically ill patients:  140 - 180 mg/dL     Results for Shelia Woods, Shelia Woods (MRN [id number]) as of 11/08/2013 09:52  Ref. Range 11/08/2013 05:54  Glucose Latest Range: 70-99 mg/dL 251 (H)     Admitted with questionable colitis.  History of DM, CKD, HTN per MD records.  Home DM Meds:   NPH insulin 30 units QHS Regular insulin- 15 units breakfast/ 20 units lunch/ 30 units dinner    MD- Please consider the following:  1. Start at least 1/2 patient's home dose of NPH tonight at bedtime 2. Start Novolog Moderate SSI Q4 hours   Will follow Wyn Quaker RN, MSN, CDE Diabetes Coordinator Inpatient Diabetes Program Team Pager: [phone] (8a-10p)

## 2013-11-08 NOTE — ED Provider Notes (Signed)
CSN: [id number]     Arrival date & time 11/08/13  0428 History   First MD Initiated Contact with Patient 11/08/13 0435     Chief Complaint  Patient presents with  . Constipation     (Consider location/radiation/quality/duration/timing/severity/associated sxs/prior Treatment) HPI Patient was seen in emergency Department July 3 for fall and tailbone pain. Started on pain medication at that point. Patient says she's had constipation for the last 8 days. She's small stools. She's had consistent abdominal distention and pain. She's had nausea without vomiting. Denies any fevers or chills. Her primary Dr. started on her on MiraLax and Colace yesterday. She's had 2 doses of MiraLax without significant bowel movement. Patient states the Colace is actually exacerbating her pain. Past Medical History  Diagnosis Date  . PAROXYSMAL ATRIAL FIBRILLATION 09/29/2008  . HYPERCHOLESTEROLEMIA 06/24/2006  . HYPERTENSION, BENIGN SYSTEMIC 06/24/2006  . FATIGUE 11/22/2007  . RENAL FAILURE 11/22/2007  . RENAL INSUFFICIENCY, CHRONIC 04/10/2008  . Proteinuria 05/17/2008  . Dermatophytosis of the body 11/30/2007  . NEOP, MALIGNANT, SKIN NOS 11/08/2006  . DIABETES MELLITUS II, UNCOMPLICATED 12/29/8331  . SYNDROME, CARPAL TUNNEL 11/08/2006  . PERIPHERAL NEUROPATHY, LOWER EXTREMITY 11/08/2006  . MACULAR DEGENERATION, SENILE, NONEXUDATIVE 01/01/2010  . CATARACTS, SENILE, NUCLEAR, BILATERAL 01/01/2010  . SMALL BOWEL OBSTRUCTION 11/05/2008  . INGROWN TOENAIL, INFECTED 04/10/2008  . OSTEOARTHRITIS, LOWER LEG 06/24/2006  . MUSCLE SPASM, TRAPEZIUS 04/02/2010  . Hyperpotassemia   . Dysrhythmia     atrial fibrilation   Past Surgical History  Procedure Laterality Date  . Carpal tunnel release    . Appendectomy    . Abdominal hysterectomy    . Oophorectomy    . Tonsillectomy    . Spinal fusion      status post at least three back surgeries   Family History  Problem Relation Age of Onset  . Diabetes Sister   . Parkinsonism  Sister    History  Substance Use Topics  . Smoking status: Former Smoker    Quit date: 04/27/1978  . Smokeless tobacco: Never Used  . Alcohol Use: No   OB History   Grav Para Term Preterm Abortions TAB SAB Ect Mult Living                 Review of Systems  Constitutional: Negative for fever and chills.  Respiratory: Negative for shortness of breath.   Cardiovascular: Negative for chest pain.  Gastrointestinal: Positive for nausea, abdominal pain, constipation and abdominal distention. Negative for vomiting, diarrhea and blood in stool.  Musculoskeletal: Negative for neck stiffness.  Skin: Negative for rash and wound.  Neurological: Negative for dizziness, weakness and numbness.  All other systems reviewed and are negative.     Allergies  Beta adrenergic blockers; Cephalexin; Codone; Darvon; Penicillins; Warfarin sodium; and Latex  Home Medications   Prior to Admission medications   Medication Sig Start Date End Date Taking? Authorizing Provider  acetaminophen (TYLENOL) 500 MG tablet 1,000 mg 3 (three) times daily. 2 by mouth three times a day   Yes Historical Provider, MD  aspirin EC 81 MG tablet Take 81 mg by mouth daily.   Yes Historical Provider, MD  atorvastatin (LIPITOR) 20 MG tablet Take 20 mg by mouth at bedtime.   Yes Historical Provider, MD  bumetanide (BUMEX) 1 MG tablet Take 1 mg by mouth 2 (two) times daily.   Yes Historical Provider, MD  Calcium Carbonate (CALCIUM 600 PO) Take 600 mg by mouth every evening.   Yes Historical Provider, MD  cetirizine (ZYRTEC) 10 MG tablet Take 10 mg by mouth daily as needed for allergies.    Yes Historical Provider, MD  cloNIDine (CATAPRES) 0.3 MG tablet Take 0.3 mg by mouth 2 (two) times daily.   Yes Historical Provider, MD  diltiazem (TAZTIA XT) 300 MG 24 hr capsule Take 1 capsule (300 mg total) by mouth daily. 02/02/13  Yes Midge Minium, MD  fenofibrate 160 MG tablet Take 160 mg by mouth every evening.   Yes Historical  Provider, MD  Glucosamine-Chondroitin (OSTEO BI-FLEX REGULAR STRENGTH) 250-200 MG TABS Take 1 tablet by mouth 2 (two) times daily.    Yes Historical Provider, MD  hydrALAZINE (APRESOLINE) 50 MG tablet Take 50 mg by mouth every 8 (eight) hours.   Yes Historical Provider, MD  insulin NPH Human (HUMULIN N,NOVOLIN N) 100 UNIT/ML injection Inject 30 Units into the skin at bedtime.   Yes Historical Provider, MD  insulin regular (NOVOLIN R,HUMULIN R) 100 units/mL injection Inject 15-30 Units into the skin 3 (three) times daily before meals. 15 units in the morning, 20 units midday and 30 units in the evening   Yes Historical Provider, MD  losartan-hydrochlorothiazide (HYZAAR) 100-12.5 MG per tablet Take 1 tablet by mouth every morning.   Yes Historical Provider, MD  Multiple Vitamins-Minerals (PRESERVISION AREDS 2 PO) Take 1 capsule by mouth 2 (two) times daily.    Yes Historical Provider, MD  nystatin-triamcinolone ointment (MYCOLOG) Apply 1 application topically 2 (two) times daily. 10/06/12  Yes Midge Minium, MD  polyethylene glycol (MIRALAX / GLYCOLAX) packet Take 17 g by mouth 2 (two) times daily as needed for mild constipation.   Yes Historical Provider, MD   BP 152/74  Pulse 74  Temp(Src) 97.3 F (36.3 C) (Oral)  Resp 16  Ht 5\' 6"  (1.676 m)  Wt 262 lb 12.6 oz (119.2 kg)  BMI 42.44 kg/m2  SpO2 94% Physical Exam  Nursing note and vitals reviewed. Constitutional: She is oriented to person, place, and time. She appears well-developed and well-nourished. No distress.  HENT:  Head: Normocephalic and atraumatic.  Mouth/Throat: Oropharynx is clear and moist.  Eyes: EOM are normal. Pupils are equal, round, and reactive to light.  Neck: Normal range of motion. Neck supple.  Cardiovascular: Normal rate and regular rhythm.   Pulmonary/Chest: Effort normal and breath sounds normal. No respiratory distress. She has no wheezes. She has no rales.  Abdominal: Soft. Bowel sounds are normal. She  exhibits distension. There is tenderness (diffuse tenderness). There is no rebound and no guarding.  Musculoskeletal: Normal range of motion. She exhibits no edema and no tenderness.  Neurological: She is alert and oriented to person, place, and time.  Skin: Skin is warm and dry. No rash noted. No erythema.  Psychiatric: She has a normal mood and affect. Her behavior is normal.    ED Course  Procedures (including critical care time) Labs Review Labs Reviewed  CBC WITH DIFFERENTIAL - Abnormal; Notable for the following:    WBC 20.5 (*)    Platelets 483 (*)    Neutrophils Relative % 89 (*)    Neutro Abs 18.3 (*)    Lymphocytes Relative 5 (*)    Monocytes Absolute 1.1 (*)    All other components within normal limits  COMPREHENSIVE METABOLIC PANEL - Abnormal; Notable for the following:    Potassium 3.6 (*)    Chloride 94 (*)    Glucose, Bld 251 (*)    BUN 45 (*)    Creatinine, Ser 1.55 (*)  Albumin 3.3 (*)    GFR calc non Af Amer 30 (*)    GFR calc Af Amer 35 (*)    Anion gap 22 (*)    All other components within normal limits  GLUCOSE, CAPILLARY - Abnormal; Notable for the following:    Glucose-Capillary 282 (*)    All other components within normal limits  GLUCOSE, CAPILLARY - Abnormal; Notable for the following:    Glucose-Capillary 236 (*)    All other components within normal limits  CLOSTRIDIUM DIFFICILE BY PCR  LIPASE, BLOOD  GI PATHOGEN PANEL BY PCR, STOOL  BASIC METABOLIC PANEL  CBC    Imaging Review Ct Abdomen Pelvis W Contrast  11/08/2013   CLINICAL DATA:  80 year old female with severe diffuse abdominal pain nausea and vomiting. Renal insufficiency. Initial encounter.  EXAM: CT ABDOMEN AND PELVIS WITH CONTRAST  TECHNIQUE: Multidetector CT imaging of the abdomen and pelvis was performed using the standard protocol following bolus administration of intravenous contrast.  CONTRAST:  5mL OMNIPAQUE IOHEXOL 300 MG/ML  SOLN  COMPARISON:  CT Abdomen and Pelvis  10/24/2009.  FINDINGS: Stable cardiomegaly. No pericardial or pleural effusion. Chronic mild elevation of the left hemidiaphragm. Stable lung bases with mild atelectasis.  Advanced degenerative changes in the spine. Previous lumbar spine posterior element surgery with severe posterior element overgrowth. No acute osseous abnormality identified.  No pelvic free fluid. Negative rectum. Decompressed bladder. Similar appearance of asymmetric perineum soft tissue (series 2, image 91) on the left of uncertain significance. Uterus surgically absent. Normal left adnexa. The right also appears to be surgically absent.  Retained stool in the sigmoid colon with occasional diverticula. Mildly indistinct appearance of the sigmoid wall with subtle fat stranding (e.g. Series 2, image 68). Fluid in stool in the left colon. The transverse colon is distended with fluid. The right colon is distended with fluid. No other colonic wall thickening or mesenteric stranding identified.  The distal ileum is decompressed. Oral contrast in mid small bowel loops which appear within normal limits. Minimally distended stomach with oral contrast. Duodenum within normal limits.  No abdominal free fluid or free air. Negative liver. Mildly distended gallbladder, but does not appear inflamed. Diminutive spleen. Pancreas within normal limits. Negative adrenal glands. Stable kidneys with no hydronephrosis or hydroureter. A chronic right greater than left perinephric stranding. Negative ureters.  Portal venous system appears patent. Aortoiliac calcified atherosclerosis noted. Major arterial structures in the abdomen and pelvis are patent.  Small fact containing hernia re- identified at the umbilicus. Superficial soft tissue thickening about the umbilicus appears increased along with mild regional fat stranding (series 2, images 84-86). No subcutaneous gas or fluid.  IMPRESSION: 1. Possible mild colitis in the sigmoid colon were there is retained stool and  occasional diverticula. More proximal colon distended with fluid such as due to diarrhea. No free air or free fluid. 2. Decompressed small bowel, no mechanical bowel obstruction suspected at this time. 3. Cannot exclude mild ventral abdominal wall cellulitis near the emboli kiss.   Electronically Signed   By: Lars Pinks M.D.   On: 11/08/2013 07:35   Dg Abd Acute W/chest  11/08/2013   CLINICAL DATA:  Diffuse abdominal pain.  EXAM: ACUTE ABDOMEN SERIES (ABDOMEN 2 VIEW & CHEST 1 VIEW)  COMPARISON:  10/24/2009 abdominal CT  FINDINGS: There is cardiomegaly, overestimated by a large fat pad noted on preceding CT. Upper mediastinal contours are unremarkable. There is no edema, consolidation, effusion, or pneumothorax.  The bowel gas pattern is abnormal, with  dilated and fluid-filled loops of small bowel. There is stool seen within the descending colon, but small bowel loops are disproportionately distended. Bowel overlaps both hips, likely from abdominal wall laxity rather than hernia. There is no pneumoperitoneum.  IMPRESSION: 1. Abnormal bowel gas pattern, likely small bowel obstruction. 2. No acute intrathoracic abnormality.   Electronically Signed   By: Jorje Guild M.D.   On: 11/08/2013 06:02     EKG Interpretation None      MDM   Final diagnoses:  Generalized abdominal pain  Abdominal distension  Obstipation  Constipation, unspecified constipation type  Leucocytosis  Nausea    Signed out to oncoming emergency physician while awaiting CT scan results.    Julianne Rice, MD 11/09/13 [phone]

## 2013-11-08 NOTE — ED Notes (Addendum)
PER EMS: pt from home, was seen on the July 3 for a fall with coccyx pain. Was prescribed oxycodone and now is experiencing constipation. Reports she has taken colace and miralax with no relief. Last bowel movement was 8 days ago. A&OX4. Abdominal distention and discomfort throughout all quadrants of abdomen. BP- 184/69, HR-80, CBG-254. Pt in A-fib. Hx of A-fib. No other complaints.

## 2013-11-08 NOTE — ED Provider Notes (Signed)
Signed out by Dr Lita Mains to check ct, probable admit.  Ct results noted, obstipation/constipation, possible colitis.  Wbc 20 w left shift. On exam, pt is distended, and persistently uncomfortable, and nauseated. zofran iv. Ivf. Enema.  Given persistent pain, nausea/distension, possible colitis/elevated wbc/left shift, will admit.  Med service contacted.     Mirna Mires, MD 11/08/13 [phone]

## 2013-11-09 DIAGNOSIS — I10 Essential (primary) hypertension: Secondary | ICD-10-CM

## 2013-11-09 LAB — CBC
HCT: 33.7 % — ABNORMAL LOW (ref 36.0–46.0)
HEMOGLOBIN: 10.7 g/dL — AB (ref 12.0–15.0)
MCH: 28.2 pg (ref 26.0–34.0)
MCHC: 31.8 g/dL (ref 30.0–36.0)
MCV: 88.7 fL (ref 78.0–100.0)
Platelets: 395 10*3/uL (ref 150–400)
RBC: 3.8 MIL/uL — AB (ref 3.87–5.11)
RDW: 14.6 % (ref 11.5–15.5)
WBC: 18 10*3/uL — ABNORMAL HIGH (ref 4.0–10.5)

## 2013-11-09 LAB — BASIC METABOLIC PANEL
Anion gap: 17 — ABNORMAL HIGH (ref 5–15)
BUN: 53 mg/dL — AB (ref 6–23)
CHLORIDE: 96 meq/L (ref 96–112)
CO2: 24 mEq/L (ref 19–32)
Calcium: 9.1 mg/dL (ref 8.4–10.5)
Creatinine, Ser: 1.93 mg/dL — ABNORMAL HIGH (ref 0.50–1.10)
GFR calc Af Amer: 27 mL/min — ABNORMAL LOW (ref >90)
GFR calc non Af Amer: 23 mL/min — ABNORMAL LOW (ref >90)
Glucose, Bld: 221 mg/dL — ABNORMAL HIGH (ref 70–99)
POTASSIUM: 3.4 meq/L — AB (ref 3.7–5.3)
Sodium: 137 mEq/L (ref 137–147)

## 2013-11-09 LAB — GLUCOSE, CAPILLARY
GLUCOSE-CAPILLARY: 192 mg/dL — AB (ref 70–99)
Glucose-Capillary: 226 mg/dL — ABNORMAL HIGH (ref 70–99)

## 2013-11-09 MED ORDER — METRONIDAZOLE 500 MG PO TABS: 500.0000 mg | ORAL_TABLET | Freq: Three times a day (TID) | ORAL | Status: DC

## 2013-11-09 MED ORDER — SENNOSIDES-DOCUSATE SODIUM 8.6-50 MG PO TABS: 1.0000 | ORAL_TABLET | Freq: Two times a day (BID) | ORAL | Status: DC

## 2013-11-09 MED ORDER — CIPROFLOXACIN HCL 500 MG PO TABS: 500.0000 mg | ORAL_TABLET | Freq: Every day | ORAL | Status: DC

## 2013-11-09 MED ORDER — BUMETANIDE 1 MG PO TABS: 1.0000 mg | ORAL_TABLET | Freq: Two times a day (BID) | ORAL | Status: DC

## 2013-11-09 MED ORDER — INSULIN ASPART 100 UNIT/ML ~~LOC~~ SOLN
0.0000 [IU] | Freq: Three times a day (TID) | SUBCUTANEOUS | Status: DC
  Administered 2013-11-09: 7 [IU] via SUBCUTANEOUS
  Administered 2013-11-09: 4 [IU] via SUBCUTANEOUS

## 2013-11-09 MED ORDER — ENOXAPARIN SODIUM 40 MG/0.4ML ~~LOC~~ SOLN
40.0000 mg | SUBCUTANEOUS | Status: DC
  Filled 2013-11-09: qty 0.4

## 2013-11-09 NOTE — Evaluation (Signed)
Physical Therapy Evaluation Patient Details Name: Shelia Woods MRN: [id number] DOB: 09/10/1930 Today's Date: 11/09/2013   History of Present Illness  Patient is a 80 y/o female admitted with colitis and constipation, which has resolved since time of PT evaluation. PMH positive for DM, HTN, A-fib, CKD, chronic back pain, sustained fall and subsequently had severe low back/tail bone pain on 7/3 seen in ER and discharged home after (-) Xrays. Developed constipation over last week with abdominal distention, nausea and dry heaves. WBC 20k, CT abdomen - mild colitis, no SBO.   Clinical Impression  Patient presents with functional limitations as listed in PT problem list. Patient with decreased functional strength and balance impairments limiting functional mobility. Pt with 1 recent fall. Education provided to family and pt on importance of safety at home and maintaining mobility. Pt would benefit from skilled PT to maximize independence and improve overall mobility/decrease fall risk in below venue at discharge.   Follow Up Recommendations Home health PT;Supervision/Assistance - 24 hour    Equipment Recommendations  None recommended by PT    Recommendations for Other Services       Precautions / Restrictions Precautions Precautions: Fall Restrictions Weight Bearing Restrictions: No      Mobility  Bed Mobility               General bed mobility comments: Sitting in chair upon arrival.  Transfers Overall transfer level: Needs assistance Equipment used: Rolling walker (2 wheeled) Transfers: Sit to/from Stand Sit to Stand: Min guard         General transfer comment: Stood x2 from chair, increased time and VC to scoot forward.   Ambulation/Gait Ambulation/Gait assistance: Min guard Ambulation Distance (Feet): 12 Feet (+ 20' with 1 seated rest break.) Assistive device: Rolling walker (2 wheeled) Gait Pattern/deviations: Decreased stride length;Trunk flexed   Gait velocity  interpretation: Below normal speed for age/gender General Gait Details:  Slow, guarded gait with decreased speed. SOB present post ambulation.   Stairs            Wheelchair Mobility    Modified Rankin (Stroke Patients Only)       Balance Overall balance assessment: Needs assistance Sitting-balance support: Feet supported;No upper extremity supported Sitting balance-Leahy Scale: Fair     Standing balance support: During functional activity;Bilateral upper extremity supported Standing balance-Leahy Scale: Poor Standing balance comment: Use of RW for support.                 Standardized Balance Assessment Standardized Balance Assessment : TUG: Timed Up and Go Test     Timed Up and Go Test TUG: Normal TUG Normal TUG (seconds): 75     Pertinent Vitals/Pain Reports no pain. SOB present during session with minimal activity. Vitals stable.    Home Living Family/patient expects to be discharged to:: Private residence Living Arrangements: Spouse/significant other Available Help at Discharge: Family;Available 24 hours/day Type of Home: House Home Access: Level entry     Home Layout: One level Home Equipment: Walker - 4 wheels;Cane - single point;Shower seat - built in;Grab bars - toilet;Grab bars - tub/shower;Wheelchair - Biomedical engineer CommentsDispensing optician.     Prior Function Level of Independence: Independent with assistive device(s)         Comments: Performs ADLs (I), husband performs IADLS - cooking/cleaning. Son lives upstairs as assists as needed. Does not drive. Reports 1 fall recently due to dizziness from medication. Minimal ambulator at baseline. Uses office chair/rollator to navigate home.  Hand Dominance   Dominant Hand: Right    Extremity/Trunk Assessment   Upper Extremity Assessment: Overall WFL for tasks assessed           Lower Extremity Assessment: Generalized weakness   LLE Deficits / Details:  Peripheral neuropathy present distal to knee with diminished light touch sensation.     Communication   Communication: No difficulties  Cognition Arousal/Alertness: Awake/alert Behavior During Therapy: WFL for tasks assessed/performed Overall Cognitive Status: Within Functional Limits for tasks assessed                      General Comments General comments (skin integrity, edema, etc.): Pt with swelling in BLEs.    Exercises        Assessment/Plan    PT Assessment Patient needs continued PT services  PT Diagnosis Generalized weakness;Difficulty walking   PT Problem List Decreased strength;Cardiopulmonary status limiting activity;Impaired sensation;Decreased activity tolerance;Decreased balance;Decreased safety awareness;Decreased mobility;Decreased knowledge of use of DME;Decreased knowledge of precautions  PT Treatment Interventions DME instruction;Balance training;Gait training;Neuromuscular re-education;Functional mobility training;Patient/family education;Therapeutic activities;Therapeutic exercise   PT Goals (Current goals can be found in the Care Plan section) Acute Rehab PT Goals Patient Stated Goal: to go home. PT Goal Formulation: With patient/family Time For Goal Achievement: 11/23/13 Potential to Achieve Goals: Good    Frequency Min 3X/week   Barriers to discharge        Co-evaluation               End of Session Equipment Utilized During Treatment: Gait belt Activity Tolerance: Patient limited by fatigue Patient left: in chair;with family/visitor present;with call bell/phone within reach Nurse Communication: Mobility status;Precautions         Time: 1423-1450 PT Time Calculation (min): 27 min   Charges:   PT Evaluation $Initial PT Evaluation Tier I: 1 Procedure PT Treatments $Therapeutic Activity: 8-22 mins   PT G CodesCandy Sledge A 11/09/2013, 3:05 PM Candy Sledge, [place], [place]

## 2013-11-09 NOTE — Care Management Note (Signed)
    Page 1 of 1   11/09/2013     3:44:40 PM CARE MANAGEMENT NOTE 11/09/2013  Patient:  Shelia Woods, Shelia Woods   Account Number:  [id number]  Date Initiated:  11/09/2013  Documentation initiated by:  Tomi Bamberger  Subjective/Objective Assessment:   dx  bowel obstruction  admit- lives with spouse.     Action/Plan:   pt eval- rec HHPT   Anticipated DC Date:  11/09/2013   Anticipated DC Plan:  [place]  CM consult      [facility] Choice  HOME HEALTH   Choice offered to / List presented to:  C-1 Patient        [place] arranged  HH-1 RN  [place] agency  [place]   Status of service:  Completed, signed off Medicare Important Message given?  NA - LOS <3 / Initial given by admissions (If response is "NO", the following Medicare IM given date fields will be blank) Date Medicare IM given:   Medicare IM given by:   Date Additional Medicare IM given:   Additional Medicare IM given by:    Discharge Disposition:  [place]  Per UR Regulation:  Reviewed for med. necessity/level of care/duration of stay  If discussed at [place] of Stay Meetings, dates discussed:    Comments:  11/09/13 [place], BSN [phone] patient lives with spouse, patient chose [place] for [facility] services, for [facility] and [facility], referral made to [place] to [place], soc will begin 24-48 hrs post dc.  NCM also gave daughter a private duty list.

## 2013-11-09 NOTE — Discharge Summary (Signed)
PATIENT DETAILS Name: Shelia Woods Age: 81 y.o. Sex: female Date of Birth: [birth year] MRN: [id number]. Admit Date: 11/08/2013 Admitting Physician: Domenic Polite, MD MOQ:[id number] Birdie Riddle, MD  Recommendations for Outpatient Follow-up:  1. Please check chemistries and CBC's-at next visit as pt has mild worsening in renal function and leukocytosis at time of discharge.  Losartan is on hold, please resume when able.  PRIMARY DISCHARGE DIAGNOSIS:  Principal Problem:   Colitis Active Problems:   Type 2 diabetes, controlled, with neuropathy   PERIPHERAL NEUROPATHY, LOWER EXTREMITY   Abdominal pain   CKD (chronic kidney disease), stage III      PAST MEDICAL HISTORY: Past Medical History  Diagnosis Date  . PAROXYSMAL ATRIAL FIBRILLATION 09/29/2008  . HYPERCHOLESTEROLEMIA 06/24/2006  . HYPERTENSION, BENIGN SYSTEMIC 06/24/2006  . FATIGUE 11/22/2007  . RENAL FAILURE 11/22/2007  . RENAL INSUFFICIENCY, CHRONIC 04/10/2008  . Proteinuria 05/17/2008  . Dermatophytosis of the body 11/30/2007  . NEOP, MALIGNANT, SKIN NOS 11/08/2006  . DIABETES MELLITUS II, UNCOMPLICATED 3/54/6568  . SYNDROME, CARPAL TUNNEL 11/08/2006  . PERIPHERAL NEUROPATHY, LOWER EXTREMITY 11/08/2006  . MACULAR DEGENERATION, SENILE, NONEXUDATIVE 01/01/2010  . CATARACTS, SENILE, NUCLEAR, BILATERAL 01/01/2010  . SMALL BOWEL OBSTRUCTION 11/05/2008  . INGROWN TOENAIL, INFECTED 04/10/2008  . OSTEOARTHRITIS, LOWER LEG 06/24/2006  . MUSCLE SPASM, TRAPEZIUS 04/02/2010  . Hyperpotassemia   . Dysrhythmia     atrial fibrilation    DISCHARGE MEDICATIONS:   Medication List    STOP taking these medications       losartan-hydrochlorothiazide 100-12.5 MG per tablet  Commonly known as:  HYZAAR      TAKE these medications       aspirin EC 81 MG tablet  Take 81 mg by mouth daily.     atorvastatin 20 MG tablet  Commonly known as:  LIPITOR  Take 20 mg by mouth at bedtime.     bumetanide 1 MG tablet  Commonly known as:  BUMEX  Take 1  tablet (1 mg total) by mouth 2 (two) times daily.  Start taking on:  11/11/2013     CALCIUM 600 PO  Take 600 mg by mouth every evening.     cetirizine 10 MG tablet  Commonly known as:  ZYRTEC  Take 10 mg by mouth daily as needed for allergies.     ciprofloxacin 500 MG tablet  Commonly known as:  CIPRO  Take 1 tablet (500 mg total) by mouth daily with breakfast.     cloNIDine 0.3 MG tablet  Commonly known as:  CATAPRES  Take 0.3 mg by mouth 2 (two) times daily.     diltiazem 300 MG 24 hr capsule  Commonly known as:  TAZTIA XT  Take 1 capsule (300 mg total) by mouth daily.     fenofibrate 160 MG tablet  Take 160 mg by mouth every evening.     hydrALAZINE 50 MG tablet  Commonly known as:  APRESOLINE  Take 50 mg by mouth every 8 (eight) hours.     insulin NPH Human 100 UNIT/ML injection  Commonly known as:  HUMULIN N,NOVOLIN N  Inject 30 Units into the skin at bedtime.     insulin regular 100 units/mL injection  Commonly known as:  NOVOLIN R,HUMULIN R  Inject 15-30 Units into the skin 3 (three) times daily before meals. 15 units in the morning, 20 units midday and 30 units in the evening     metroNIDAZOLE 500 MG tablet  Commonly known as:  FLAGYL  Take 1 tablet (500  mg total) by mouth 3 (three) times daily.     nystatin-triamcinolone ointment  Commonly known as:  MYCOLOG  Apply 1 application topically 2 (two) times daily.     OSTEO BI-FLEX REGULAR STRENGTH 250-200 MG Tabs  Generic drug:  Glucosamine-Chondroitin  Take 1 tablet by mouth 2 (two) times daily.     polyethylene glycol packet  Commonly known as:  MIRALAX / GLYCOLAX  Take 17 g by mouth 2 (two) times daily as needed for mild constipation.     PRESERVISION AREDS 2 PO  Take 1 capsule by mouth 2 (two) times daily.     senna-docusate 8.6-50 MG per tablet  Commonly known as:  Senokot-S  Take 1 tablet by mouth 2 (two) times daily.     TYLENOL 500 MG tablet  Generic drug:  acetaminophen  1,000 mg 3 (three)  times daily. 2 by mouth three times a day        ALLERGIES:   Allergies  Allergen Reactions  . Beta Adrenergic Blockers     REACTION: depression  . Cephalexin Other (See Comments)    unknown  . Codone [Hydrocodone] Nausea And Vomiting  . Darvon [Propoxyphene]     dizziness  . Penicillins     REACTION: hives/intestinal tract  . Warfarin Sodium     REACTION: bleeding on skin surface  . Latex Rash    BRIEF HPI:  See H&P, Labs, Consult and Test reports for all details in brief, patient is a 80 y.o. female with PMH of DM, HTN, AFib, CKD, chronic back pain, sustained a fall and subsequently had severe low back/tail bone pain on 7/3, seen in the ER, discharged home after negative Xrays on Tramadol this was changed to Oxycodone few days later by PCP due to uncontrolled pain.  She was evaluated for abdominal pain and constipation. In ER, WBC 20K, Ct with mild colitis and no SBO.   CONSULTATIONS:   None  PERTINENT RADIOLOGIC STUDIES: Dg Lumbar Spine Complete  10/27/2013   CLINICAL DATA:  Fall, low back pain  EXAM: LUMBAR SPINE - COMPLETE 4+ VIEW  COMPARISON:  CT abdomen pelvis dated 10/24/2009  FINDINGS: Five lumbar type vertebral bodies.  Loss of the normal lumbar lordosis.  Mild lumbar levoscoliosis.  No evidence of fracture or dislocation. Vertebral body heights are maintained. Moderate multilevel degenerative changes.  Visualized bony pelvis appears intact.  Vascular calcifications.  IMPRESSION: No fracture or dislocation is seen.  Moderate multilevel degenerative changes.   Electronically Signed   By: Julian Hy M.D.   On: 10/27/2013 20:53   Dg Sacrum/coccyx  10/27/2013   CLINICAL DATA:  Fall, low back/ sacral pain  EXAM: SACRUM AND COCCYX - 2+ VIEW  COMPARISON:  None.  FINDINGS: There is no evidence of fracture or other focal bone lesions.  Specifically, no displaced sacrococcygeal fracture is seen.  IMPRESSION: Negative.   Electronically Signed   By: Julian Hy M.D.   On:  10/27/2013 20:53   Ct Abdomen Pelvis W Contrast  11/08/2013   CLINICAL DATA:  80 year old female with severe diffuse abdominal pain nausea and vomiting. Renal insufficiency. Initial encounter.  EXAM: CT ABDOMEN AND PELVIS WITH CONTRAST  TECHNIQUE: Multidetector CT imaging of the abdomen and pelvis was performed using the standard protocol following bolus administration of intravenous contrast.  CONTRAST:  75mL OMNIPAQUE IOHEXOL 300 MG/ML  SOLN  COMPARISON:  CT Abdomen and Pelvis 10/24/2009.  FINDINGS: Stable cardiomegaly. No pericardial or pleural effusion. Chronic mild elevation of the left hemidiaphragm.  Stable lung bases with mild atelectasis.  Advanced degenerative changes in the spine. Previous lumbar spine posterior element surgery with severe posterior element overgrowth. No acute osseous abnormality identified.  No pelvic free fluid. Negative rectum. Decompressed bladder. Similar appearance of asymmetric perineum soft tissue (series 2, image 91) on the left of uncertain significance. Uterus surgically absent. Normal left adnexa. The right also appears to be surgically absent.  Retained stool in the sigmoid colon with occasional diverticula. Mildly indistinct appearance of the sigmoid wall with subtle fat stranding (e.g. Series 2, image 68). Fluid in stool in the left colon. The transverse colon is distended with fluid. The right colon is distended with fluid. No other colonic wall thickening or mesenteric stranding identified.  The distal ileum is decompressed. Oral contrast in mid small bowel loops which appear within normal limits. Minimally distended stomach with oral contrast. Duodenum within normal limits.  No abdominal free fluid or free air. Negative liver. Mildly distended gallbladder, but does not appear inflamed. Diminutive spleen. Pancreas within normal limits. Negative adrenal glands. Stable kidneys with no hydronephrosis or hydroureter. A chronic right greater than left perinephric stranding.  Negative ureters.  Portal venous system appears patent. Aortoiliac calcified atherosclerosis noted. Major arterial structures in the abdomen and pelvis are patent.  Small fact containing hernia re- identified at the umbilicus. Superficial soft tissue thickening about the umbilicus appears increased along with mild regional fat stranding (series 2, images 84-86). No subcutaneous gas or fluid.  IMPRESSION: 1. Possible mild colitis in the sigmoid colon were there is retained stool and occasional diverticula. More proximal colon distended with fluid such as due to diarrhea. No free air or free fluid. 2. Decompressed small bowel, no mechanical bowel obstruction suspected at this time. 3. Cannot exclude mild ventral abdominal wall cellulitis near the emboli kiss.   Electronically Signed   By: Lars Pinks M.D.   On: 11/08/2013 07:35   Dg Abd Acute W/chest  11/08/2013   CLINICAL DATA:  Diffuse abdominal pain.  EXAM: ACUTE ABDOMEN SERIES (ABDOMEN 2 VIEW & CHEST 1 VIEW)  COMPARISON:  10/24/2009 abdominal CT  FINDINGS: There is cardiomegaly, overestimated by a large fat pad noted on preceding CT. Upper mediastinal contours are unremarkable. There is no edema, consolidation, effusion, or pneumothorax.  The bowel gas pattern is abnormal, with dilated and fluid-filled loops of small bowel. There is stool seen within the descending colon, but small bowel loops are disproportionately distended. Bowel overlaps both hips, likely from abdominal wall laxity rather than hernia. There is no pneumoperitoneum.  IMPRESSION: 1. Abnormal bowel gas pattern, likely small bowel obstruction. 2. No acute intrathoracic abnormality.   Electronically Signed   By: Jorje Guild M.D.   On: 11/08/2013 06:02     PERTINENT LAB RESULTS: CBC:  Recent Labs  11/08/13 0554 11/09/13 0716  WBC 20.5* 18.0*  HGB 12.2 10.7*  HCT 38.7 33.7*  PLT 483* 395   CMET CMP     Component Value Date/Time   NA 137 11/09/2013 0716   K 3.4* 11/09/2013 0716     CL 96 11/09/2013 0716   CO2 24 11/09/2013 0716   GLUCOSE 221* 11/09/2013 0716   BUN 53* 11/09/2013 0716   CREATININE 1.93* 11/09/2013 0716   CALCIUM 9.1 11/09/2013 0716   PROT 7.5 11/08/2013 0554   ALBUMIN 3.3* 11/08/2013 0554   AST 14 11/08/2013 0554   ALT 13 11/08/2013 0554   ALKPHOS 67 11/08/2013 0554   BILITOT 0.4 11/08/2013 0554   GFRNONAA 23*  11/09/2013 0716   GFRAA 27* 11/09/2013 0716    GFR Estimated Creatinine Clearance: 30.1 ml/min (by C-G formula based on Cr of 1.93).  Recent Labs  11/08/13 0554  LIPASE 16   No results found for this basename: CKTOTAL, CKMB, CKMBINDEX, TROPONINI,  in the last 72 hours No components found with this basename: POCBNP,  No results found for this basename: DDIMER,  in the last 72 hours No results found for this basename: HGBA1C,  in the last 72 hours No results found for this basename: CHOL, HDL, LDLCALC, TRIG, CHOLHDL, LDLDIRECT,  in the last 72 hours No results found for this basename: TSH, T4TOTAL, FREET3, T3FREE, THYROIDAB,  in the last 72 hours No results found for this basename: VITAMINB12, FOLATE, FERRITIN, TIBC, IRON, RETICCTPCT,  in the last 72 hours Coags: No results found for this basename: PT, INR,  in the last 72 hours Microbiology: No results found for this or any previous visit (from the past 240 hour(s)).   BRIEF HOSPITAL COURSE:  Principal Problem:  Colitis/Constipation  Pt admitted for abd pain, CT abd showed mild colitis and constipation.  Patient was admitted and started on IV cipro/flagyl and given enema and senekot with good results.  Overnight, patient made rapid improvement with significant decrease in abdominal pain, no further bowel movements this morning. Stool studies not sent because pt had no bowel movements today.  Leukocytosis has a downward trend and patient and husband at bedside are requesting discharge.  She will be transitioned to oral cipro/flagyl on discharge and will follow up with her PCP in the next few  days.  Please note that patient tolerated a regular diet for lunch.  Active Problems:  Type 2 diabetes, controlled, with neuropathy  -resume home insulin at lower dose and SSI   PERIPHERAL NEUROPATHY, LOWER EXTREMITY  -secondary to above, continue with supportive care  HTN  -resume Clonidine, Hydralaxine, Ditiazem,and diuretic.  Losartan on hold, please resume when able  Acute on CKD (chronic kidney disease), stage III  -Planned on starting IVF, however patient and family refusing politely to stay another night, they are requesting discharge. Patient advised to keep hydrated and follow up with PCP in a 2-3 days for follow up visit. They are agreeable.   P.Afib  -rate controlled, continue diltiazem, ASA  -reportedly not on AC due to intolerance    TODAY-DAY OF DISCHARGE:  Subjective:   Shelia Woods today has no headache,no chest abdominal pain,no new weakness tingling or numbness, feels much better wants to go home today.  Had a good bowel movement yesterday and has had significant relief in abdominal pain.  Husband and daughter at bedside who are requesting discharge.  They ensure [place] that the patient will follow up with PCP in the next few days.  Objective:   Blood pressure 120/63, pulse 58, temperature 98.7 F (37.1 C), temperature source Oral, resp. rate 18, height 5\' 6"  (1.676 m), weight 119.2 kg (262 lb 12.6 oz), SpO2 96.00%.  Intake/Output Summary (Last 24 hours) at 11/09/13 1433 Last data filed at 11/09/13 0603  Gross per 24 hour  Intake 1137.5 ml  Output      0 ml  Net 1137.5 ml   Filed Weights   11/08/13 1700  Weight: 119.2 kg (262 lb 12.6 oz)    Exam Awake Alert, Oriented *3, No new F.N deficits, Normal affect [place].AT,PERRAL Supple Neck,No JVD, No cervical lymphadenopathy appriciated.  Symmetrical Chest wall movement, Good air movement bilaterally, CTAB RRR,No Gallops,Rubs or new Murmurs,  No Parasternal Heave +ve B.Sounds, Abd Soft, Non tender, No organomegaly  appriciated, No rebound -guarding or rigidity. No Cyanosis, Clubbing or edema, No new Rash or bruise  DISCHARGE CONDITION: Stable  DISPOSITION: Home  DISCHARGE INSTRUCTIONS:    Activity:  As tolerated with Full fall precautions use walker/cane & assistance as needed  Diet recommendation: Diabetic Diet Heart Healthy diet   Discharge Instructions   Call MD for:  persistant nausea and vomiting    Complete by:  As directed      Call MD for:  severe uncontrolled pain    Complete by:  As directed      Diet - low sodium heart healthy    Complete by:  As directed      Diet Carb Modified    Complete by:  As directed      Increase activity slowly    Complete by:  As directed            Follow-up Information   Follow up with Annye Asa, MD. Schedule an appointment as soon as possible for a visit in 3 days.   Specialty:  Family Medicine   Contact information:   [phone] [place] [address] [phone]      Total Time spent on discharge equals 45 minutes.  SignedGarnette Gunner 11/09/2013 2:33 PM  **Disclaimer: This note may have been dictated with voice recognition software. Similar sounding words can inadvertently be transcribed and this note may contain transcription errors which may not have been corrected upon publication of note.**  Attending Patient was seen, examined,treatment plan was discussed with the Physician extender. I have directly reviewed the clinical findings, lab, imaging studies and management of this patient in detail. I have made the necessary changes to the above noted documentation, and agree with the documentation, as recorded by the Physician extender.  Nena Alexander MD [facility].

## 2013-11-09 NOTE — Progress Notes (Signed)
Nsg Discharge Note  Admit Date:  11/08/2013 Discharge date: 11/09/2013   Shelia Woods to be D/C'd Home per MD order.  AVS completed.  Copy for chart, and copy for patient signed, and dated. Patient/caregiver able to verbalize understanding.  Discharge Medication:   Medication List    STOP taking these medications       losartan-hydrochlorothiazide 100-12.5 MG per tablet  Commonly known as:  HYZAAR      TAKE these medications       aspirin EC 81 MG tablet  Take 81 mg by mouth daily.     atorvastatin 20 MG tablet  Commonly known as:  LIPITOR  Take 20 mg by mouth at bedtime.     bumetanide 1 MG tablet  Commonly known as:  BUMEX  Take 1 tablet (1 mg total) by mouth 2 (two) times daily.  Start taking on:  11/11/2013     CALCIUM 600 PO  Take 600 mg by mouth every evening.     cetirizine 10 MG tablet  Commonly known as:  ZYRTEC  Take 10 mg by mouth daily as needed for allergies.     ciprofloxacin 500 MG tablet  Commonly known as:  CIPRO  Take 1 tablet (500 mg total) by mouth daily with breakfast.     cloNIDine 0.3 MG tablet  Commonly known as:  CATAPRES  Take 0.3 mg by mouth 2 (two) times daily.     diltiazem 300 MG 24 hr capsule  Commonly known as:  TAZTIA XT  Take 1 capsule (300 mg total) by mouth daily.     fenofibrate 160 MG tablet  Take 160 mg by mouth every evening.     hydrALAZINE 50 MG tablet  Commonly known as:  APRESOLINE  Take 50 mg by mouth every 8 (eight) hours.     insulin NPH Human 100 UNIT/ML injection  Commonly known as:  HUMULIN N,NOVOLIN N  Inject 30 Units into the skin at bedtime.     insulin regular 100 units/mL injection  Commonly known as:  NOVOLIN R,HUMULIN R  Inject 15-30 Units into the skin 3 (three) times daily before meals. 15 units in the morning, 20 units midday and 30 units in the evening     metroNIDAZOLE 500 MG tablet  Commonly known as:  FLAGYL  Take 1 tablet (500 mg total) by mouth 3 (three) times daily.     nystatin-triamcinolone ointment  Commonly known as:  MYCOLOG  Apply 1 application topically 2 (two) times daily.     OSTEO BI-FLEX REGULAR STRENGTH 250-200 MG Tabs  Generic drug:  Glucosamine-Chondroitin  Take 1 tablet by mouth 2 (two) times daily.     polyethylene glycol packet  Commonly known as:  MIRALAX / GLYCOLAX  Take 17 g by mouth 2 (two) times daily as needed for mild constipation.     PRESERVISION AREDS 2 PO  Take 1 capsule by mouth 2 (two) times daily.     senna-docusate 8.6-50 MG per tablet  Commonly known as:  Senokot-S  Take 1 tablet by mouth 2 (two) times daily.     TYLENOL 500 MG tablet  Generic drug:  acetaminophen  1,000 mg 3 (three) times daily. 2 by mouth three times a day        Discharge Assessment: Filed Vitals:   11/09/13 1412  BP: 120/63  Pulse: 58  Temp: 98.7 F (37.1 C)  Resp: 18   Stage II noted to sacrum area, three tiny ones. Pt. Encouraged to  reposition herself at home.  IV catheter discontinued intact. Site without signs and symptoms of complications - no redness or edema noted at insertion site, patient denies c/o pain - only slight tenderness at site.  Dressing with slight pressure applied.  D/c Instructions-Education: Discharge instructions given to patient/family with verbalized understanding. D/c education completed with patient/family including follow up instructions, medication list, d/c activities limitations if indicated, with other d/c instructions as indicated by MD - patient able to verbalize understanding, all questions fully answered. Patient instructed to return to ED, call 911, or call MD for any changes in condition.  Patient escorted via [employer], and D/C home via private auto.  Dayle Points, RN 11/09/2013 4:20 PM

## 2013-11-09 NOTE — Progress Notes (Signed)
Utilization review completed.  

## 2013-11-09 NOTE — Progress Notes (Signed)
PATIENT DETAILS Name: Shelia Woods Age: 80 y.o. Sex: female Date of Birth: 01-13-1931 Admit Date: 11/08/2013 Admitting Physician Domenic Polite, MD JIR:[id number] Birdie Riddle, MD  Subjective: Doing much better after having a large bowel movement yesterday.  Started to have some watery stool after that BM. No BM this AM. Abdominal pain much improved.  Back pain has also improved back to her baseline.  Denied N/V.Requsting discharge  Assessment/Plan: Principal Problem:   Colitis/Constipation -On IV Cipro/Flagyl for possible colitis  -On miralax and senokot for constipation.  Much improved after a large BM yesterday. -Advanced diet to reg food, tolerated reg diet for lunch -stool studies not able to be done-as no BM today.  Active Problems:   Type 2 diabetes, controlled, with neuropathy -resume home insulin at lower dose and SSI    PERIPHERAL NEUROPATHY, LOWER EXTREMITY -secondary to above    HTN -resume Clonidine, Hydralaxine, and Ditiazem.  ACE and diuretic are being held.    Acute on CKD (chronic kidney disease), stage III -Planned on starting IVF, however patient and family refusing politely to stay another night, they are requesting discharge. Patient advised to keep hydrated and follow up with PCP in a 2-3 days for follow up visit. They are agreeable.    P.Afib  -rate controlled, continue diltiazem, ASA  -reportedly not on AC due to intolerance   Disposition: Remain inpatient. Discharge home today-at patient's request  DVT Prophylaxis: Prophylactic Lovenox  Code Status: Full code or DNR  Family Communication Lives with husband at home.  Not at bedside at the moment.  Procedures:  None  CONSULTS:  None  Time spent 40 minutes-which includes 50% of the time with face-to-face with patient/ family and coordinating care related to the above assessment and plan.   MEDICATIONS: Scheduled Meds: . aspirin EC  81 mg Oral Daily  . atorvastatin  20 mg Oral QHS    . ciprofloxacin  400 mg Intravenous Q12H  . cloNIDine  0.3 mg Oral BID  . diltiazem  300 mg Oral Daily  . enoxaparin (LOVENOX) injection  40 mg Subcutaneous Q24H  . fenofibrate  160 mg Oral QPM  . hydrALAZINE  50 mg Oral 3 times per day  . insulin aspart  0-20 Units Subcutaneous TID WC  . insulin NPH Human  20 Units Subcutaneous QHS  . metronidazole  500 mg Intravenous Q8H  . polyethylene glycol  17 g Oral BID  . senna-docusate  1 tablet Oral BID   Continuous Infusions:  PRN Meds:.hydrALAZINE, morphine injection, ondansetron (ZOFRAN) IV  Antibiotics: Anti-infectives   Start     Dose/Rate Route Frequency Ordered Stop   11/08/13 0930  ciprofloxacin (CIPRO) IVPB 400 mg     400 mg 200 mL/hr over 60 Minutes Intravenous Every 12 hours 11/08/13 0900     11/08/13 0900  metroNIDAZOLE (FLAGYL) IVPB 500 mg     500 mg 100 mL/hr over 60 Minutes Intravenous Every 8 hours 11/08/13 0850         PHYSICAL EXAM: Vital signs in last 24 hours: Filed Vitals:   11/08/13 1700 11/08/13 1956 11/09/13 0341 11/09/13 0600  BP:  174/65 152/74 145/72  Pulse:  63 74   Temp:  98.1 F (36.7 C) 97.3 F (36.3 C)   TempSrc:  Oral Oral   Resp:  16 16   Height:      Weight: 119.2 kg (262 lb 12.6 oz)     SpO2:  95% 94%  Weight change:  Filed Weights   11/08/13 1700  Weight: 119.2 kg (262 lb 12.6 oz)   Body mass index is 42.44 kg/(m^2).   Gen Exam: Awake and alert with clear speech.  Sitting up in chair Neck: Supple, No JVD.   Chest: B/L Clear.   CVS: S1 S2 Regular, no murmurs.  Abdomen: soft, BS +, mild diffuse tenderness, mild distension, no rigidity or rebound Extremities: 2+ edema bilaterally, lower extremities warm to touch. Neurologic: Non Focal.   Skin: No Rash. Wounds: N/A.   Intake/Output from previous day:  Intake/Output Summary (Last 24 hours) at 11/09/13 0905 Last data filed at 11/09/13 0603  Gross per 24 hour  Intake 1137.5 ml  Output      0 ml  Net 1137.5 ml     LAB  RESULTS: CBC  Recent Labs Lab 11/08/13 0554 11/09/13 0716  WBC 20.5* 18.0*  HGB 12.2 10.7*  HCT 38.7 33.7*  PLT 483* 395  MCV 89.8 88.7  MCH 28.3 28.2  MCHC 31.5 31.8  RDW 14.3 14.6  LYMPHSABS 1.1  --   MONOABS 1.1*  --   EOSABS 0.0  --   BASOSABS 0.0  --     Chemistries   Recent Labs Lab 11/08/13 0554 11/09/13 0716  NA 138 137  K 3.6* 3.4*  CL 94* 96  CO2 22 24  GLUCOSE 251* 221*  BUN 45* 53*  CREATININE 1.55* 1.93*  CALCIUM 10.4 9.1    CBG:  Recent Labs Lab 11/08/13 1733 11/08/13 2107 11/09/13 0735  GLUCAP 282* 236* 226*    GFR Estimated Creatinine Clearance: 30.1 ml/min (by C-G formula based on Cr of 1.93).  Coagulation profile No results found for this basename: INR, PROTIME,  in the last 168 hours  Cardiac Enzymes No results found for this basename: CK, CKMB, TROPONINI, MYOGLOBIN,  in the last 168 hours  No components found with this basename: POCBNP,  No results found for this basename: DDIMER,  in the last 72 hours No results found for this basename: HGBA1C,  in the last 72 hours No results found for this basename: CHOL, HDL, LDLCALC, TRIG, CHOLHDL, LDLDIRECT,  in the last 72 hours No results found for this basename: TSH, T4TOTAL, FREET3, T3FREE, THYROIDAB,  in the last 72 hours No results found for this basename: VITAMINB12, FOLATE, FERRITIN, TIBC, IRON, RETICCTPCT,  in the last 72 hours  Recent Labs  11/08/13 0554  LIPASE 16    Urine Studies No results found for this basename: UACOL, UAPR, USPG, UPH, UTP, UGL, UKET, UBIL, UHGB, UNIT, UROB, ULEU, UEPI, UWBC, URBC, UBAC, CAST, CRYS, UCOM, BILUA,  in the last 72 hours  MICROBIOLOGY: No results found for this or any previous visit (from the past 240 hour(s)).  RADIOLOGY STUDIES/RESULTS: Dg Lumbar Spine Complete  10/27/2013   CLINICAL DATA:  Fall, low back pain  EXAM: LUMBAR SPINE - COMPLETE 4+ VIEW  COMPARISON:  CT abdomen pelvis dated 10/24/2009  FINDINGS: Five lumbar type vertebral  bodies.  Loss of the normal lumbar lordosis.  Mild lumbar levoscoliosis.  No evidence of fracture or dislocation. Vertebral body heights are maintained. Moderate multilevel degenerative changes.  Visualized bony pelvis appears intact.  Vascular calcifications.  IMPRESSION: No fracture or dislocation is seen.  Moderate multilevel degenerative changes.   Electronically Signed   By: Julian Hy M.D.   On: 10/27/2013 20:53   Dg Sacrum/coccyx  10/27/2013   CLINICAL DATA:  Fall, low back/ sacral pain  EXAM: SACRUM AND COCCYX -  2+ VIEW  COMPARISON:  None.  FINDINGS: There is no evidence of fracture or other focal bone lesions.  Specifically, no displaced sacrococcygeal fracture is seen.  IMPRESSION: Negative.   Electronically Signed   By: Julian Hy M.D.   On: 10/27/2013 20:53   Ct Abdomen Pelvis W Contrast  11/08/2013   CLINICAL DATA:  81 year old female with severe diffuse abdominal pain nausea and vomiting. Renal insufficiency. Initial encounter.  EXAM: CT ABDOMEN AND PELVIS WITH CONTRAST  TECHNIQUE: Multidetector CT imaging of the abdomen and pelvis was performed using the standard protocol following bolus administration of intravenous contrast.  CONTRAST:  75mL OMNIPAQUE IOHEXOL 300 MG/ML  SOLN  COMPARISON:  CT Abdomen and Pelvis 10/24/2009.  FINDINGS: Stable cardiomegaly. No pericardial or pleural effusion. Chronic mild elevation of the left hemidiaphragm. Stable lung bases with mild atelectasis.  Advanced degenerative changes in the spine. Previous lumbar spine posterior element surgery with severe posterior element overgrowth. No acute osseous abnormality identified.  No pelvic free fluid. Negative rectum. Decompressed bladder. Similar appearance of asymmetric perineum soft tissue (series 2, image 91) on the left of uncertain significance. Uterus surgically absent. Normal left adnexa. The right also appears to be surgically absent.  Retained stool in the sigmoid colon with occasional diverticula.  Mildly indistinct appearance of the sigmoid wall with subtle fat stranding (e.g. Series 2, image 68). Fluid in stool in the left colon. The transverse colon is distended with fluid. The right colon is distended with fluid. No other colonic wall thickening or mesenteric stranding identified.  The distal ileum is decompressed. Oral contrast in mid small bowel loops which appear within normal limits. Minimally distended stomach with oral contrast. Duodenum within normal limits.  No abdominal free fluid or free air. Negative liver. Mildly distended gallbladder, but does not appear inflamed. Diminutive spleen. Pancreas within normal limits. Negative adrenal glands. Stable kidneys with no hydronephrosis or hydroureter. A chronic right greater than left perinephric stranding. Negative ureters.  Portal venous system appears patent. Aortoiliac calcified atherosclerosis noted. Major arterial structures in the abdomen and pelvis are patent.  Small fact containing hernia re- identified at the umbilicus. Superficial soft tissue thickening about the umbilicus appears increased along with mild regional fat stranding (series 2, images 84-86). No subcutaneous gas or fluid.  IMPRESSION: 1. Possible mild colitis in the sigmoid colon were there is retained stool and occasional diverticula. More proximal colon distended with fluid such as due to diarrhea. No free air or free fluid. 2. Decompressed small bowel, no mechanical bowel obstruction suspected at this time. 3. Cannot exclude mild ventral abdominal wall cellulitis near the emboli kiss.   Electronically Signed   By: Lars Pinks M.D.   On: 11/08/2013 07:35   Dg Abd Acute W/chest  11/08/2013   CLINICAL DATA:  Diffuse abdominal pain.  EXAM: ACUTE ABDOMEN SERIES (ABDOMEN 2 VIEW & CHEST 1 VIEW)  COMPARISON:  10/24/2009 abdominal CT  FINDINGS: There is cardiomegaly, overestimated by a large fat pad noted on preceding CT. Upper mediastinal contours are unremarkable. There is no edema,  consolidation, effusion, or pneumothorax.  The bowel gas pattern is abnormal, with dilated and fluid-filled loops of small bowel. There is stool seen within the descending colon, but small bowel loops are disproportionately distended. Bowel overlaps both hips, likely from abdominal wall laxity rather than hernia. There is no pneumoperitoneum.  IMPRESSION: 1. Abnormal bowel gas pattern, likely small bowel obstruction. 2. No acute intrathoracic abnormality.   Electronically Signed   By: Jorje Guild  M.D.   On: 11/08/2013 Tomi Bamberger, PA-S, [facility]  [facility] Pager:[phone]  If 7PM-7AM, please contact night-coverage [email or web address] Password [email or web address] 11/09/2013, 9:05 AM   LOS: 1 day   **Disclaimer: This note may have been dictated with voice recognition software. Similar sounding words can inadvertently be transcribed and this note may contain transcription errors which may not have been corrected upon publication of note.**  Attending Patient was seen, examined,treatment plan was discussed with the Physician extender. I have directly reviewed the clinical findings, lab, imaging studies and management of this patient in detail. I have made the necessary changes to the above noted documentation, and agree with the documentation, as recorded by the Physician extender.  Nena Alexander MD [facility].

## 2013-11-10 ENCOUNTER — Telehealth: Payer: Self-pay

## 2013-11-10 NOTE — Telephone Encounter (Signed)
Admitted: 11/08/2013 Discharged: 11/09/2013  Spoke with patient who is now home and states that she is feeling better. No abdominal pain at this time. Bowels are moving without concerns. Still having issues with mobility due to recent fall. Patient is now resting well. Is able to perform ADL's with little assistance. Spouse and daughter have been helping take care of her needs. Currently taking Cipro and Flagyl orally.   Discharge meds: Cipro/Flagyl Discontinued at discharge: Losartan/HCTZ 100/12.5mg  No changes to other medications or allergies No changes to personal or family hx or surgeries  Local pharmacy: [id number] - [place], [address] AT [address]  Recommendations at discharge: Check chemistries and CBC's--noted mild worsening of renal function and leukocytosis. Also, "resume losartan when able"  Hospital follow-up scheduled with Dr Birdie Riddle Thursday July 23 at 11:30

## 2013-11-16 ENCOUNTER — Ambulatory Visit: Payer: Medicare Other | Admitting: Family Medicine

## 2013-12-04 ENCOUNTER — Telehealth: Payer: Self-pay

## 2013-12-04 DIAGNOSIS — K5289 Other specified noninfective gastroenteritis and colitis: Secondary | ICD-10-CM

## 2013-12-04 DIAGNOSIS — I509 Heart failure, unspecified: Secondary | ICD-10-CM

## 2013-12-04 DIAGNOSIS — N183 Chronic kidney disease, stage 3 unspecified: Secondary | ICD-10-CM

## 2013-12-04 DIAGNOSIS — I129 Hypertensive chronic kidney disease with stage 1 through stage 4 chronic kidney disease, or unspecified chronic kidney disease: Secondary | ICD-10-CM

## 2013-12-04 NOTE — Telephone Encounter (Signed)
Received via fax on 11/29/13.  Forms labeled.  Billing sheet attached. Placed in Dr. Virgil Benedict red folder for review and signature.

## 2013-12-07 NOTE — Telephone Encounter (Signed)
Received signed Home Health Certification and POC and Physician Verbal Order from Dr. Birdie Riddle.  All forms mailed to [employer].  Unable to fax.//AB/CMA

## 2013-12-11 ENCOUNTER — Telehealth: Payer: Self-pay

## 2013-12-11 NOTE — Telephone Encounter (Signed)
Received Home Health Certification and Plan of Care forms from [facility] regarding Skilled nursing, wound care, and discontinuing weights.  Forms labeled and placed in Dr. Virgil Benedict red folder for review and signature.

## 2013-12-12 DIAGNOSIS — K5289 Other specified noninfective gastroenteritis and colitis: Secondary | ICD-10-CM

## 2013-12-12 DIAGNOSIS — N183 Chronic kidney disease, stage 3 unspecified: Secondary | ICD-10-CM

## 2013-12-12 DIAGNOSIS — I509 Heart failure, unspecified: Secondary | ICD-10-CM

## 2013-12-12 DIAGNOSIS — I129 Hypertensive chronic kidney disease with stage 1 through stage 4 chronic kidney disease, or unspecified chronic kidney disease: Secondary | ICD-10-CM

## 2013-12-13 NOTE — Telephone Encounter (Signed)
Received signed Home Health Certification and POC forms,Physician Verbal Order for wound care form,and Physician Verbal Order for discontinue weights form from Dr. Birdie Riddle.  All forms faxed to [employer] at [phone]).   Confirmation received.//AB/CMA

## 2014-02-07 ENCOUNTER — Ambulatory Visit (INDEPENDENT_AMBULATORY_CARE_PROVIDER_SITE_OTHER): Payer: Medicare Other | Admitting: Family Medicine

## 2014-02-07 ENCOUNTER — Encounter: Payer: Self-pay | Admitting: Family Medicine

## 2014-02-07 VITALS — BP 140/82 | HR 54 | Temp 97.4°F | Resp 17 | Ht 66.0 in | Wt 235.2 lb

## 2014-02-07 DIAGNOSIS — I10 Essential (primary) hypertension: Secondary | ICD-10-CM

## 2014-02-07 DIAGNOSIS — E1140 Type 2 diabetes mellitus with diabetic neuropathy, unspecified: Secondary | ICD-10-CM

## 2014-02-07 DIAGNOSIS — I480 Paroxysmal atrial fibrillation: Secondary | ICD-10-CM

## 2014-02-07 DIAGNOSIS — E7800 Pure hypercholesterolemia, unspecified: Secondary | ICD-10-CM

## 2014-02-07 DIAGNOSIS — N183 Chronic kidney disease, stage 3 unspecified: Secondary | ICD-10-CM

## 2014-02-07 DIAGNOSIS — Z0000 Encounter for general adult medical examination without abnormal findings: Secondary | ICD-10-CM

## 2014-02-07 DIAGNOSIS — Z23 Encounter for immunization: Secondary | ICD-10-CM

## 2014-02-07 NOTE — Progress Notes (Signed)
Pre visit review using our clinic review tool, if applicable. No additional management support is needed unless otherwise documented below in the visit note. 

## 2014-02-07 NOTE — Progress Notes (Signed)
   Subjective:    Patient ID: Shelia Woods, female    DOB: 12-07-1930, 80 y.o.   MRN: [id number]  HPI Here today for CPE.  Risk Factors: DM- chronic problem, on Novolin N at night (30 units) and Novolin R TID (15, 20, 30).  UTD on eye exam (q6 months).  Denies symptomatic lows x6 months.  Neuropathy is stable. HTN- chronic problem, on Clonidine, Hydralazine, Bumex, Dilt.  Well controlled today.  No CP, SOB, HAs, visual changes. Hyperlipidemia- chronic problem, on Fenofibrate and Lipitor. CKD- chronic problem, levels have been stable and pt has not needed to f/u w/ nephrology Afib- pt w/ hx of this, not currently on anticoag w/ exception of 81mg  ASA.  Dr Stanford Breed recommended novel anticoagulant and pt refused.  Was on regular ASA but had issues w/ bleeding so decreased back to 81mg . Physical Activity: limited to none Fall Risk: high Depression: denies Hearing: normal to conversational tones, decreased to whispered voice ADL's: independent Cognitive: normal linear thought process, memory and attention Home Safety: feels safe at home, lives w/ husband, very limited means and difficult circumstances Height, Weight, BMI, Visual Acuity: see vitals, vision corrected to 20/20 w/ glasses Counseling: UTD on eye exam, no need for paps.  Pt declines mammo, colonoscopy, DEXA- 'i can't do it'. Living will- pt w/ healthcare POA and living will Labs Ordered: See A&P Care Plan: See A&P    Review of Systems Patient reports no vision/ hearing changes, adenopathy,fever, weight change,  persistant/recurrent hoarseness , swallowing issues, chest pain, palpitations, edema, persistant/recurrent cough, hemoptysis, dyspnea (rest/exertional/paroxysmal nocturnal), gastrointestinal bleeding (melena, rectal bleeding), abdominal pain, significant heartburn, bowel changes, GU symptoms (dysuria, hematuria, incontinence), Gyn symptoms (abnormal  bleeding, pain),  syncope, skin/hair/nail changes, abnormal bruising or  bleeding, anxiety, or depression.     Objective:   Physical Exam General Appearance:    Alert, cooperative, no distress, appears stated age, obese, sitting in wheel chair  Head:    Normocephalic, without obvious abnormality, atraumatic  Eyes:    PERRL, conjunctiva/corneas clear, EOM's intact, fundi    benign, both eyes  Ears:    Normal TM's and external ear canals, both ears  Nose:   Nares normal, septum midline, mucosa normal, no drainage    or sinus tenderness  Throat:   Lips, mucosa, and tongue normal; teeth and gums normal  Neck:   Supple, symmetrical, trachea midline, no adenopathy;    Thyroid: no enlargement/tenderness/nodules  Back:     Symmetric, no curvature, ROM normal, no CVA tenderness  Lungs:     Clear to auscultation bilaterally, respirations unlabored  Chest Wall:    No tenderness or deformity   Heart:    Regular rate and rhythm, S1 and S2 normal, no murmur, rub   or gallop  Breast Exam:    Deferred at pt's request  Abdomen:     Soft, non-tender, bowel sounds active all four quadrants,    no masses, no organomegaly  Genitalia:    Deferred  Rectal:    Extremities:   Extremities normal, atraumatic, no cyanosis, trace to 1+ edema bilaterally  Pulses:   2+ and symmetric all extremities  Skin:   Skin color, texture, turgor normal, no rashes or lesions  Lymph nodes:   Cervical, supraclavicular, and axillary nodes normal  Neurologic:   CNII-XII intact, normal strength, sensation and reflexes    throughout          Assessment & Plan:

## 2014-02-07 NOTE — Patient Instructions (Signed)
Follow up in 6 months to recheck BP and cholesterol We'll notify you of your lab results and make any changes if needed Keep up the good work!  You look great! Call with any questions or concerns Hang in there!!!

## 2014-02-08 LAB — CBC WITH DIFFERENTIAL/PLATELET
BASOS PCT: 0.4 % (ref 0.0–3.0)
Basophils Absolute: 0 10*3/uL (ref 0.0–0.1)
EOS PCT: 2 % (ref 0.0–5.0)
Eosinophils Absolute: 0.2 10*3/uL (ref 0.0–0.7)
HCT: 36.8 % (ref 36.0–46.0)
Hemoglobin: 11.8 g/dL — ABNORMAL LOW (ref 12.0–15.0)
LYMPHS PCT: 16.5 % (ref 12.0–46.0)
Lymphs Abs: 1.3 10*3/uL (ref 0.7–4.0)
MCHC: 32 g/dL (ref 30.0–36.0)
MCV: 86.1 fl (ref 78.0–100.0)
Monocytes Absolute: 0.7 10*3/uL (ref 0.1–1.0)
Monocytes Relative: 8.9 % (ref 3.0–12.0)
NEUTROS PCT: 72.2 % (ref 43.0–77.0)
Neutro Abs: 5.7 10*3/uL (ref 1.4–7.7)
Platelets: 361 10*3/uL (ref 150.0–400.0)
RBC: 4.28 Mil/uL (ref 3.87–5.11)
RDW: 15.6 % — ABNORMAL HIGH (ref 11.5–15.5)
WBC: 7.9 10*3/uL (ref 4.0–10.5)

## 2014-02-08 LAB — HEPATIC FUNCTION PANEL
ALBUMIN: 3.3 g/dL — AB (ref 3.5–5.2)
ALT: 20 U/L (ref 0–35)
AST: 23 U/L (ref 0–37)
Alkaline Phosphatase: 34 U/L — ABNORMAL LOW (ref 39–117)
Bilirubin, Direct: 0 mg/dL (ref 0.0–0.3)
TOTAL PROTEIN: 7.5 g/dL (ref 6.0–8.3)
Total Bilirubin: 0.5 mg/dL (ref 0.2–1.2)

## 2014-02-08 LAB — LIPID PANEL
Cholesterol: 200 mg/dL (ref 0–200)
HDL: 35.1 mg/dL — AB (ref >39.00)
NONHDL: 164.9
TRIGLYCERIDES: 329 mg/dL — AB (ref 0.0–149.0)
Total CHOL/HDL Ratio: 6
VLDL: 65.8 mg/dL — ABNORMAL HIGH (ref 0.0–40.0)

## 2014-02-08 LAB — BASIC METABOLIC PANEL
BUN: 41 mg/dL — AB (ref 6–23)
CALCIUM: 10.5 mg/dL (ref 8.4–10.5)
CO2: 26 meq/L (ref 19–32)
CREATININE: 1.6 mg/dL — AB (ref 0.4–1.2)
Chloride: 100 mEq/L (ref 96–112)
GFR: 33.8 mL/min — ABNORMAL LOW (ref >60.00)
GLUCOSE: 149 mg/dL — AB (ref 70–99)
Potassium: 3.8 mEq/L (ref 3.5–5.1)
Sodium: 136 mEq/L (ref 135–145)

## 2014-02-08 LAB — TSH: TSH: 1.94 u[IU]/mL (ref 0.35–4.50)

## 2014-02-08 LAB — HEMOGLOBIN A1C: HEMOGLOBIN A1C: 7.1 % — AB (ref 4.6–6.5)

## 2014-02-09 ENCOUNTER — Other Ambulatory Visit: Payer: Self-pay | Admitting: Family Medicine

## 2014-02-09 LAB — LDL CHOLESTEROL, DIRECT: Direct LDL: 109.2 mg/dL

## 2014-02-09 NOTE — Telephone Encounter (Signed)
Med filled.  

## 2014-02-10 ENCOUNTER — Other Ambulatory Visit: Payer: Self-pay | Admitting: Family Medicine

## 2014-02-11 NOTE — Assessment & Plan Note (Signed)
Chronic problem.  Tolerating statin w/o difficulty.  Check labs.  Adjust meds prn  

## 2014-02-11 NOTE — Assessment & Plan Note (Signed)
Chronic problem.  Saw renal once previously but Cr has been stable since then and pt prefers not to go.  Will follow.

## 2014-02-11 NOTE — Assessment & Plan Note (Signed)
Chronic problem.  Adequate control.  Asymptomatic.  Check labs.  No anticipated med changes 

## 2014-02-11 NOTE — Assessment & Plan Note (Signed)
Chronic problem.  Pt's rate and rhythm is regular today.  Asymptomatic.  Following w/ cards.  On ASA but not any other form of anticoagulation.

## 2014-02-11 NOTE — Assessment & Plan Note (Signed)
Chronic problem.  Pt taking insulin regularly.  Chronic neuropathy- unchanged from previous.  UTD on eye exam.  On ARB for renal protection.  Check labs.  Adjust meds prn.

## 2014-02-11 NOTE — Assessment & Plan Note (Signed)
Pt's PE is unchanged from her previous.  Pt declines all health maintenance- 'i can't do it'.  Check labs.  Anticipatory guidance provided.

## 2014-02-12 ENCOUNTER — Telehealth: Payer: Self-pay

## 2014-02-12 NOTE — Telephone Encounter (Signed)
Spoke with spouse who states that patient has been experiencing nausea, chills. Patient has been resting, drinking fluids, gatorade and small amounts of food. Patient is taking Zofran for the nausea. Just checking to be sure that they were doing everything correct. Advised to contact the office if patient is not showing improvement. Voices understanding.

## 2014-02-12 NOTE — Telephone Encounter (Signed)
Med filled.  

## 2014-02-14 ENCOUNTER — Other Ambulatory Visit: Payer: Self-pay | Admitting: General Practice

## 2014-02-14 ENCOUNTER — Other Ambulatory Visit: Payer: Self-pay | Admitting: Family Medicine

## 2014-02-14 ENCOUNTER — Telehealth: Payer: Self-pay | Admitting: Family Medicine

## 2014-02-14 MED ORDER — DOXYCYCLINE HYCLATE 100 MG PO TABS: 100.0000 mg | ORAL_TABLET | Freq: Two times a day (BID) | ORAL | Status: DC

## 2014-02-14 NOTE — Telephone Encounter (Signed)
Caller name: Janeliz Relation to pt: self Call back number: [phone] Pharmacy:  Reason for call:   Patient states that she is having a lump on her arm after receiving the pneumonia shot. She states that the lump is red, hot and a rash on it. Patient wants to know what she should do about this.

## 2014-02-14 NOTE — Telephone Encounter (Signed)
That lump needs to be evaluated b/c it may be a reaction to the flu shot or it may be cellulitis (local infection).  Is there any way that pt would be able to be seen tomorrow?

## 2014-02-14 NOTE — Telephone Encounter (Signed)
Med filled.  

## 2014-02-14 NOTE — Telephone Encounter (Signed)
Spoke with pt, due to pt she has a silver dollar size lump on her arm where immunization was given. States no pus or drainage, no streaking down the arm. Spoke with Dr. Birdie Riddle while pt was on the phone and doxy filled per her verbal. Pt notified.

## 2014-02-14 NOTE — Telephone Encounter (Signed)
Pt was given shot on 10/14

## 2014-02-15 NOTE — Telephone Encounter (Signed)
Pt described redness and warmth extending away from immunization site and now size of silver dollar.  Pt unable to come in for appt due to transportation issues.  Due to what sounds like cellulitis at site of injxn, will start abx w/ strict return precautions if area continues to enlarge, pain worsens, pus develops, or other concerns.

## 2014-02-19 ENCOUNTER — Other Ambulatory Visit: Payer: Self-pay | Admitting: Family Medicine

## 2014-02-19 NOTE — Telephone Encounter (Signed)
Med filled.  

## 2014-02-27 ENCOUNTER — Other Ambulatory Visit: Payer: Self-pay | Admitting: General Practice

## 2014-02-27 MED ORDER — FENOFIBRATE 160 MG PO TABS: 160.0000 mg | ORAL_TABLET | Freq: Every evening | ORAL | Status: DC

## 2014-03-03 ENCOUNTER — Other Ambulatory Visit: Payer: Self-pay | Admitting: Family Medicine

## 2014-03-05 NOTE — Telephone Encounter (Signed)
Med filled.  

## 2014-03-06 ENCOUNTER — Other Ambulatory Visit: Payer: Self-pay | Admitting: Family Medicine

## 2014-03-06 NOTE — Telephone Encounter (Signed)
Med filled.  

## 2014-03-24 ENCOUNTER — Other Ambulatory Visit: Payer: Self-pay | Admitting: Family Medicine

## 2014-03-26 NOTE — Telephone Encounter (Signed)
Med filled.  

## 2014-04-04 ENCOUNTER — Other Ambulatory Visit: Payer: Self-pay | Admitting: General Practice

## 2014-04-04 MED ORDER — INSULIN NPH (HUMAN) (ISOPHANE) 100 UNIT/ML ~~LOC~~ SUSP: 30.0000 [IU] | Freq: Every day | SUBCUTANEOUS | Status: DC

## 2014-04-05 ENCOUNTER — Telehealth: Payer: Self-pay | Admitting: Family Medicine

## 2014-04-05 MED ORDER — INSULIN NPH (HUMAN) (ISOPHANE) 100 UNIT/ML ~~LOC~~ SUSP: 30.0000 [IU] | Freq: Every day | SUBCUTANEOUS | Status: DC

## 2014-04-05 NOTE — Telephone Encounter (Signed)
Caller name: Keya Relation to pt: self Call back number: [phone] Pharmacy: [facility] [address]  Reason for call:   requestings a year refill of novolin to be called into [facility]. She states that it is cheaper this way.

## 2014-04-05 NOTE — Telephone Encounter (Signed)
Mary Naval architect from [employer] states that patient needs 6 vials at one time.   Best # [phone]

## 2014-04-05 NOTE — Telephone Encounter (Signed)
Med filled.  

## 2014-04-05 NOTE — Telephone Encounter (Signed)
Reset again.

## 2014-04-05 NOTE — Addendum Note (Signed)
Addended by: Kris Hartmann on: 04/05/2014 02:34 PM   Modules accepted: Orders

## 2014-04-26 ENCOUNTER — Other Ambulatory Visit: Payer: Self-pay | Admitting: Family Medicine

## 2014-04-26 ENCOUNTER — Telehealth: Payer: Self-pay | Admitting: Family Medicine

## 2014-04-26 NOTE — Telephone Encounter (Signed)
Caller name: Juanda Crumble Relation to pt: husband Call back number: [phone] Pharmacy: [facility] P: [phone]  Reason for call:   Patient husband states that patient would like a 90  day supply with refills of all medications sent to this pharmacy.

## 2014-04-26 NOTE — Telephone Encounter (Signed)
Medication filled.  

## 2014-04-30 MED ORDER — LOSARTAN POTASSIUM-HCTZ 100-12.5 MG PO TABS: 1.0000 | ORAL_TABLET | Freq: Every day | ORAL | Status: DC

## 2014-04-30 MED ORDER — CALCIUM CARBONATE 600 MG PO TABS: 600.0000 mg | ORAL_TABLET | Freq: Every evening | ORAL | Status: DC

## 2014-04-30 MED ORDER — GLUCOSE BLOOD VI STRP: ORAL_STRIP | Status: DC

## 2014-04-30 MED ORDER — ATORVASTATIN CALCIUM 20 MG PO TABS: 20.0000 mg | ORAL_TABLET | Freq: Every day | ORAL | Status: DC

## 2014-04-30 MED ORDER — FENOFIBRATE 160 MG PO TABS: 160.0000 mg | ORAL_TABLET | Freq: Every evening | ORAL | Status: DC

## 2014-04-30 MED ORDER — CLONIDINE HCL 0.3 MG PO TABS: 0.3000 mg | ORAL_TABLET | Freq: Two times a day (BID) | ORAL | Status: DC

## 2014-04-30 MED ORDER — DILTIAZEM HCL ER BEADS 300 MG PO CP24: ORAL_CAPSULE | ORAL | Status: DC

## 2014-04-30 MED ORDER — BUMETANIDE 1 MG PO TABS: 1.0000 mg | ORAL_TABLET | Freq: Two times a day (BID) | ORAL | Status: DC

## 2014-04-30 MED ORDER — ONETOUCH ULTRASOFT LANCETS MISC: Status: DC

## 2014-04-30 MED ORDER — INSULIN NPH (HUMAN) (ISOPHANE) 100 UNIT/ML ~~LOC~~ SUSP: 30.0000 [IU] | Freq: Every day | SUBCUTANEOUS | Status: DC

## 2014-04-30 MED ORDER — INSULIN REGULAR HUMAN 100 UNIT/ML IJ SOLN: 15.0000 [IU] | Freq: Three times a day (TID) | INTRAMUSCULAR | Status: DC

## 2014-04-30 NOTE — Telephone Encounter (Signed)
Medications filled and faxed.

## 2014-05-09 ENCOUNTER — Telehealth: Payer: Self-pay | Admitting: Family Medicine

## 2014-05-09 ENCOUNTER — Other Ambulatory Visit: Payer: Self-pay | Admitting: Family Medicine

## 2014-05-09 MED ORDER — FLUOCINONIDE 0.05 % EX CREA: 1.0000 "application " | TOPICAL_CREAM | Freq: Two times a day (BID) | CUTANEOUS | Status: DC

## 2014-05-09 NOTE — Telephone Encounter (Signed)
Med filled.  

## 2014-05-09 NOTE — Telephone Encounter (Signed)
Caller name:Heringer, Marcie Bal Relation to OM:BTDH Call back number:[phone] [facility]  Reason for call: pt is needing rx fluocinonide cream (LIDEX) 0.05 % sent to [facility] 90 day supply please call [facility] . Pt would like for you to let her know when its done so she will know when to expect it in the mail

## 2014-05-10 ENCOUNTER — Other Ambulatory Visit: Payer: Self-pay | Admitting: General Practice

## 2014-05-10 MED ORDER — INSULIN REGULAR HUMAN 100 UNIT/ML IJ SOLN: 15.0000 [IU] | Freq: Three times a day (TID) | INTRAMUSCULAR | Status: DC

## 2014-05-10 MED ORDER — FLUOCINONIDE 0.05 % EX CREA: 1.0000 "application " | TOPICAL_CREAM | Freq: Two times a day (BID) | CUTANEOUS | Status: DC

## 2014-05-10 MED ORDER — INSULIN NPH (HUMAN) (ISOPHANE) 100 UNIT/ML ~~LOC~~ SUSP: 30.0000 [IU] | Freq: Every day | SUBCUTANEOUS | Status: DC

## 2014-05-10 MED ORDER — CLONIDINE HCL 0.3 MG PO TABS: 0.3000 mg | ORAL_TABLET | Freq: Two times a day (BID) | ORAL | Status: DC

## 2014-05-10 MED ORDER — NYSTATIN-TRIAMCINOLONE 100000-0.1 UNIT/GM-% EX OINT: 1.0000 "application " | TOPICAL_OINTMENT | Freq: Two times a day (BID) | CUTANEOUS | Status: DC

## 2014-05-10 MED ORDER — BUMETANIDE 1 MG PO TABS: 1.0000 mg | ORAL_TABLET | Freq: Two times a day (BID) | ORAL | Status: DC

## 2014-05-10 MED ORDER — LOSARTAN POTASSIUM-HCTZ 100-12.5 MG PO TABS: 1.0000 | ORAL_TABLET | Freq: Every day | ORAL | Status: DC

## 2014-05-10 MED ORDER — FENOFIBRATE 160 MG PO TABS: 160.0000 mg | ORAL_TABLET | Freq: Every evening | ORAL | Status: DC

## 2014-05-10 MED ORDER — CALCIUM CARBONATE 600 MG PO TABS: 600.0000 mg | ORAL_TABLET | Freq: Every evening | ORAL | Status: DC

## 2014-05-10 MED ORDER — SENNOSIDES-DOCUSATE SODIUM 8.6-50 MG PO TABS: 1.0000 | ORAL_TABLET | Freq: Two times a day (BID) | ORAL | Status: DC

## 2014-05-10 MED ORDER — DILTIAZEM HCL ER BEADS 300 MG PO CP24: ORAL_CAPSULE | ORAL | Status: DC

## 2014-05-10 NOTE — Addendum Note (Signed)
Addended by: Kris Hartmann on: 05/10/2014 02:45 PM   Modules accepted: Orders

## 2014-05-10 NOTE — Telephone Encounter (Signed)
Patient states that insurance will not cover for rx through [facility].   Prescriptions need to go to [facility] on [address]

## 2014-05-10 NOTE — Telephone Encounter (Signed)
Refills ordered.

## 2014-05-20 ENCOUNTER — Other Ambulatory Visit: Payer: Self-pay | Admitting: Family Medicine

## 2014-05-21 NOTE — Telephone Encounter (Signed)
Med filled.  

## 2014-06-01 ENCOUNTER — Telehealth: Payer: Self-pay | Admitting: Family Medicine

## 2014-06-01 ENCOUNTER — Other Ambulatory Visit: Payer: Self-pay | Admitting: General Practice

## 2014-06-01 MED ORDER — INSULIN NPH (HUMAN) (ISOPHANE) 100 UNIT/ML ~~LOC~~ SUSP: 30.0000 [IU] | Freq: Every day | SUBCUTANEOUS | Status: DC

## 2014-06-01 MED ORDER — INSULIN REGULAR HUMAN 100 UNIT/ML IJ SOLN
15.0000 [IU] | Freq: Three times a day (TID) | INTRAMUSCULAR | Status: DC
Start: 2014-06-01 — End: 2014-07-11

## 2014-06-01 MED ORDER — INSULIN REGULAR HUMAN 100 UNIT/ML IJ SOLN
15.0000 [IU] | Freq: Three times a day (TID) | INTRAMUSCULAR | Status: DC
Start: 2014-06-01 — End: 2014-06-01

## 2014-06-01 NOTE — Addendum Note (Signed)
Addended by: Kris Hartmann on: 06/01/2014 02:56 PM   Modules accepted: Orders

## 2014-06-01 NOTE — Telephone Encounter (Signed)
Caller name: [facility]  Relation to pt: Pharmacy  Call back number: [phone]   Reason for call:  Pt needs the insulin regular (NOVOLIN R,HUMULIN R) 100 units/mL injection not the insulin NPH Human (HUMULIN N,NOVOLIN N) 100 UNIT/ML injection. Please advise

## 2014-06-01 NOTE — Telephone Encounter (Signed)
Patient husband requesting callback when sent

## 2014-06-01 NOTE — Telephone Encounter (Signed)
Never received a request form the pharmacy. This was filled this morning.

## 2014-06-01 NOTE — Telephone Encounter (Signed)
Med filled, pt husband notified.

## 2014-06-01 NOTE — Telephone Encounter (Signed)
Med filled.  

## 2014-06-01 NOTE — Telephone Encounter (Signed)
Patient husband has picked up the three bottles of this medication but would like three more called in. He states that they have transportation issues and always want 6 bottles(total) called in. Best # [phone]  He says that the three bottles that were picked up will only last her a month.   Patient requesting a callback when sent.

## 2014-06-01 NOTE — Telephone Encounter (Signed)
Sent in another rx for 6 vials.

## 2014-06-01 NOTE — Telephone Encounter (Signed)
Caller name:  Juanda Crumble Relation to AG:TXMIWOE Call back number: [phone] [facility]acy: [facility] [address]  Reason for call:   Patient husband states that patient had contacted the pharmacy earlier in the week regarding rx. Patient will be out at noon. Please send insulin refill of humelin R

## 2014-06-15 ENCOUNTER — Other Ambulatory Visit: Payer: Self-pay | Admitting: Family Medicine

## 2014-06-15 NOTE — Telephone Encounter (Signed)
Med filled.  

## 2014-07-11 ENCOUNTER — Telehealth: Payer: Self-pay | Admitting: Family Medicine

## 2014-07-11 MED ORDER — INSULIN NPH (HUMAN) (ISOPHANE) 100 UNIT/ML ~~LOC~~ SUSP: 30.0000 [IU] | Freq: Every day | SUBCUTANEOUS | Status: DC

## 2014-07-11 MED ORDER — INSULIN REGULAR HUMAN 100 UNIT/ML IJ SOLN: 15.0000 [IU] | Freq: Three times a day (TID) | INTRAMUSCULAR | Status: DC

## 2014-07-11 NOTE — Telephone Encounter (Signed)
Caller name: Cavan,Charles E Relation to pt: spouse  Call back number: [phone] Pharmacy: [facility] [place], [place] [phone] (Phone) [phone] (Fax)        Reason for call:   Spouse states its an on going issues with [facility] regarding wife insulin. Pt states the communication barrier is frustrating and I apologized and explained i would make MD aware. Spouse states pt uses 6 bottles of insulin regular (NOVOLIN R,HUMULIN R) 100 units/mL injection and 3 bottles of insulin NPH Human (HUMULIN N,NOVOLIN N) 100 UNIT/ML injection. Spouse states [facility] because it cheaper. Please advise.

## 2014-07-11 NOTE — Telephone Encounter (Signed)
Medications filled to [facility] again.

## 2014-07-16 ENCOUNTER — Other Ambulatory Visit: Payer: Self-pay | Admitting: Family Medicine

## 2014-07-16 NOTE — Telephone Encounter (Signed)
Med filled.  

## 2014-07-30 ENCOUNTER — Other Ambulatory Visit: Payer: Self-pay | Admitting: Family Medicine

## 2014-07-30 NOTE — Telephone Encounter (Signed)
Med filled.  

## 2014-08-06 ENCOUNTER — Other Ambulatory Visit: Payer: Self-pay | Admitting: Family Medicine

## 2014-08-06 NOTE — Telephone Encounter (Signed)
Med filled.  

## 2014-08-07 ENCOUNTER — Other Ambulatory Visit: Payer: Self-pay | Admitting: Family Medicine

## 2014-08-07 NOTE — Telephone Encounter (Signed)
Med filled.  

## 2014-08-08 ENCOUNTER — Encounter: Payer: Self-pay | Admitting: Family Medicine

## 2014-08-08 ENCOUNTER — Ambulatory Visit (INDEPENDENT_AMBULATORY_CARE_PROVIDER_SITE_OTHER): Payer: Medicare Other | Admitting: Family Medicine

## 2014-08-08 VITALS — BP 140/90 | HR 69 | Temp 97.9°F | Resp 16

## 2014-08-08 DIAGNOSIS — E7800 Pure hypercholesterolemia, unspecified: Secondary | ICD-10-CM

## 2014-08-08 DIAGNOSIS — E1140 Type 2 diabetes mellitus with diabetic neuropathy, unspecified: Secondary | ICD-10-CM

## 2014-08-08 DIAGNOSIS — I10 Essential (primary) hypertension: Secondary | ICD-10-CM | POA: Diagnosis not present

## 2014-08-08 LAB — BASIC METABOLIC PANEL
BUN: 44 mg/dL — AB (ref 6–23)
CALCIUM: 10.4 mg/dL (ref 8.4–10.5)
CO2: 27 meq/L (ref 19–32)
CREATININE: 1.51 mg/dL — AB (ref 0.40–1.20)
Chloride: 99 mEq/L (ref 96–112)
GFR: 35.05 mL/min — AB (ref >60.00)
GLUCOSE: 293 mg/dL — AB (ref 70–99)
Potassium: 3.7 mEq/L (ref 3.5–5.1)
Sodium: 134 mEq/L — ABNORMAL LOW (ref 135–145)

## 2014-08-08 LAB — LIPID PANEL
CHOLESTEROL: 205 mg/dL — AB (ref 0–200)
HDL: 41.4 mg/dL (ref >39.00)
NONHDL: 163.6
TRIGLYCERIDES: 349 mg/dL — AB (ref 0.0–149.0)
Total CHOL/HDL Ratio: 5
VLDL: 69.8 mg/dL — ABNORMAL HIGH (ref 0.0–40.0)

## 2014-08-08 LAB — CBC WITH DIFFERENTIAL/PLATELET
BASOS PCT: 0.4 % (ref 0.0–3.0)
Basophils Absolute: 0 10*3/uL (ref 0.0–0.1)
EOS PCT: 1.1 % (ref 0.0–5.0)
Eosinophils Absolute: 0.1 10*3/uL (ref 0.0–0.7)
HEMATOCRIT: 36.1 % (ref 36.0–46.0)
Hemoglobin: 12.2 g/dL (ref 12.0–15.0)
LYMPHS ABS: 1.1 10*3/uL (ref 0.7–4.0)
Lymphocytes Relative: 13.1 % (ref 12.0–46.0)
MCHC: 33.7 g/dL (ref 30.0–36.0)
MCV: 84.4 fl (ref 78.0–100.0)
Monocytes Absolute: 0.7 10*3/uL (ref 0.1–1.0)
Monocytes Relative: 7.7 % (ref 3.0–12.0)
NEUTROS PCT: 77.7 % — AB (ref 43.0–77.0)
Neutro Abs: 6.7 10*3/uL (ref 1.4–7.7)
Platelets: 350 10*3/uL (ref 150.0–400.0)
RBC: 4.28 Mil/uL (ref 3.87–5.11)
RDW: 15 % (ref 11.5–15.5)
WBC: 8.6 10*3/uL (ref 4.0–10.5)

## 2014-08-08 LAB — LDL CHOLESTEROL, DIRECT: Direct LDL: 108 mg/dL

## 2014-08-08 LAB — HEPATIC FUNCTION PANEL
ALBUMIN: 3.7 g/dL (ref 3.5–5.2)
ALT: 15 U/L (ref 0–35)
AST: 15 U/L (ref 0–37)
Alkaline Phosphatase: 38 U/L — ABNORMAL LOW (ref 39–117)
Bilirubin, Direct: 0 mg/dL (ref 0.0–0.3)
TOTAL PROTEIN: 7.3 g/dL (ref 6.0–8.3)
Total Bilirubin: 0.3 mg/dL (ref 0.2–1.2)

## 2014-08-08 LAB — TSH: TSH: 2.02 u[IU]/mL (ref 0.35–4.50)

## 2014-08-08 LAB — HEMOGLOBIN A1C: Hgb A1c MFr Bld: 7.1 % — ABNORMAL HIGH (ref 4.6–6.5)

## 2014-08-08 MED ORDER — FLUOCINONIDE 0.05 % EX CREA: 1.0000 "application " | TOPICAL_CREAM | Freq: Two times a day (BID) | CUTANEOUS | Status: DC

## 2014-08-08 MED ORDER — BUMETANIDE 1 MG PO TABS: 1.0000 mg | ORAL_TABLET | Freq: Two times a day (BID) | ORAL | Status: DC

## 2014-08-08 MED ORDER — CALCIUM CARBONATE 600 MG PO TABS: 600.0000 mg | ORAL_TABLET | Freq: Every evening | ORAL | Status: DC

## 2014-08-08 MED ORDER — FENOFIBRATE 160 MG PO TABS: 160.0000 mg | ORAL_TABLET | Freq: Every evening | ORAL | Status: DC

## 2014-08-08 NOTE — Patient Instructions (Signed)
Schedule your complete physical in 6 months We'll notify you of your lab results and make any changes if needed Keep up the good work!  You look good! Call with any questions or concerns Happy [place]!!!

## 2014-08-08 NOTE — Progress Notes (Signed)
   Subjective:    Patient ID: Shelia Woods, female    DOB: 03-04-1931, 81 y.o.   MRN: [id number]  HPI DM- chronic problem, pt has eye exam scheduled for next month.  Pt is taking 30 units of NPH at night and then doing Novolin 15 units/20 units/30 units.  On ARB for renal protection.  No progression of neuropathy.  No sores on feet.  HTN- chronic problem.  BP is elevated today.  Currently on Bumex, Clonidine, Hydralazine, Losartan HCTZ, Taztia.  Pt admits to increased stress recently w/ husband's medical issues.  No CP, SOB, HAs, visual changes, edema.  Hyperlipidemia- chronic problem, on Lipitor and Fenofibrate.  No abd pain, N/V.  Review of Systems For ROS see HPI     Objective:   Physical Exam  Constitutional: She is oriented to person, place, and time. She appears well-developed and well-nourished. No distress.  Seated in wheelchair obese  HENT:  Head: Normocephalic and atraumatic.  Eyes: Conjunctivae and EOM are normal. Pupils are equal, round, and reactive to light.  Neck: Normal range of motion. Neck supple. No thyromegaly present.  Cardiovascular: Normal rate, regular rhythm, normal heart sounds and intact distal pulses.   No murmur heard. Pulmonary/Chest: Effort normal and breath sounds normal. No respiratory distress.  Abdominal: Soft. She exhibits no distension. There is no tenderness.  Musculoskeletal: She exhibits edema (trace, nonpitting edema bilaterally).  Lymphadenopathy:    She has no cervical adenopathy.  Neurological: She is alert and oriented to person, place, and time.  Skin: Skin is warm and dry.  Psychiatric: She has a normal mood and affect. Her behavior is normal.  Vitals reviewed.         Assessment & Plan:

## 2014-08-08 NOTE — Progress Notes (Signed)
Pre visit review using our clinic review tool, if applicable. No additional management support is needed unless otherwise documented below in the visit note. 

## 2014-08-10 ENCOUNTER — Other Ambulatory Visit: Payer: Self-pay | Admitting: Family Medicine

## 2014-08-10 NOTE — Telephone Encounter (Signed)
Med filled.  

## 2014-08-12 NOTE — Assessment & Plan Note (Signed)
Chronic problem.  UTD on eye exam, on ARB for renal protection.  Neuropathy is stable and she is otherwise asymptomatic.  Check labs.  Adjust meds prn

## 2014-08-12 NOTE — Assessment & Plan Note (Signed)
Chronic problem.  Tolerating statin and fenofibrate w/o difficulty.  Check labs.  Adjust meds prn  

## 2014-08-12 NOTE — Assessment & Plan Note (Signed)
Chronic problem, adequate control today.  Asymptomatic.  Check labs.  No anticipated med changes.

## 2014-09-26 ENCOUNTER — Other Ambulatory Visit: Payer: Self-pay | Admitting: Family Medicine

## 2014-09-27 NOTE — Telephone Encounter (Signed)
Med filled.  

## 2014-10-06 ENCOUNTER — Other Ambulatory Visit: Payer: Self-pay | Admitting: Family Medicine

## 2014-10-08 NOTE — Telephone Encounter (Signed)
Med filled.  

## 2014-10-14 LAB — HM DIABETES EYE EXAM

## 2014-11-05 ENCOUNTER — Telehealth: Payer: Self-pay | Admitting: Family Medicine

## 2014-11-05 MED ORDER — ONETOUCH ULTRA SYSTEM W/DEVICE KIT: 1.0000 | PACK | Freq: Once | Status: AC

## 2014-11-05 NOTE — Telephone Encounter (Signed)
Med filled now,.

## 2014-11-05 NOTE — Telephone Encounter (Signed)
Patient is at pharmacy now.

## 2014-11-05 NOTE — Telephone Encounter (Signed)
Caller name: teresa from [facility] Relation to pt: Call back number: [phone] Pharmacy:  Reason for call:   Requesting a new blood glucose machine for patient. Patient uses the one touch ultra 2.

## 2014-11-08 ENCOUNTER — Telehealth: Payer: Self-pay | Admitting: Family Medicine

## 2014-11-08 MED ORDER — ONETOUCH DELICA LANCETS 33G MISC: Status: DC

## 2014-11-08 NOTE — Telephone Encounter (Signed)
Relation to pt: self  Call back number: [phone] Pharmacy: [id number] - [place], [address] AT [address]  Reason for call:  As per patient she has a new blood sugar machine one touch ultra requesting delica lancets. Please advise

## 2014-11-08 NOTE — Telephone Encounter (Signed)
Med filled.  

## 2014-11-22 ENCOUNTER — Telehealth: Payer: Self-pay | Admitting: Family Medicine

## 2014-11-22 NOTE — Telephone Encounter (Signed)
Pt called back and stated that pharmacy did not have script. Called [facility] and [place], pharm tech, stated they had script, but that per patient's insurance she would not be able to pick up refill until Monday, August 1st.  The same was explained to the patient, she stated understanding.  No further needs voiced at this time.

## 2014-11-22 NOTE — Telephone Encounter (Signed)
Request has already been filled on 11/08/14 (100, 12 refills).  Pt was encouraged to call pharmacy to see if they have received the script. If not, she was advised to call back.

## 2014-11-22 NOTE — Telephone Encounter (Signed)
Relation to pt: self  Call back number: [phone] Pharmacy: [facility] - [place], [address] [phone] (Phone) [phone] (Fax)         Reason for call:  Patient requesting lancents for one touch delica

## 2015-01-02 ENCOUNTER — Telehealth: Payer: Self-pay | Admitting: Family Medicine

## 2015-01-02 NOTE — Telephone Encounter (Signed)
Pt is wanting RX for transport chair sent to [facility], fax# [phone]. I advised insurance will likely require and OV for documentation of specifics to qualify pt for wheelchair. He wants to get the ball rolling on ordering it.

## 2015-01-02 NOTE — Telephone Encounter (Signed)
Pt needs a face to face OV before we can start any documents. Please schedule for a 30 minute appt

## 2015-01-02 NOTE — Telephone Encounter (Signed)
Notified pt husband 30 min OV needed and he said he'll call at another time to schedule.

## 2015-01-07 ENCOUNTER — Other Ambulatory Visit: Payer: Self-pay | Admitting: Family Medicine

## 2015-01-07 NOTE — Telephone Encounter (Signed)
Medication filled to pharmacy as requested.   

## 2015-01-13 ENCOUNTER — Other Ambulatory Visit: Payer: Self-pay | Admitting: Family Medicine

## 2015-01-14 NOTE — Telephone Encounter (Signed)
Medication filled to pharmacy as requested.   

## 2015-01-23 ENCOUNTER — Encounter (HOSPITAL_COMMUNITY): Payer: Self-pay

## 2015-01-23 ENCOUNTER — Ambulatory Visit (HOSPITAL_COMMUNITY)
Admission: RE | Admit: 2015-01-23 | Discharge: 2015-01-23 | Disposition: A | Discharge status: Home / Self Care | Payer: Medicare Other | Source: Ambulatory Visit | Attending: Cardiology | Admitting: Cardiology

## 2015-01-23 ENCOUNTER — Other Ambulatory Visit: Payer: Self-pay | Admitting: Family Medicine

## 2015-01-23 VITALS — BP 142/74 | HR 61 | Wt 227.8 lb

## 2015-01-23 DIAGNOSIS — Z833 Family history of diabetes mellitus: Secondary | ICD-10-CM | POA: Diagnosis not present

## 2015-01-23 DIAGNOSIS — E785 Hyperlipidemia, unspecified: Secondary | ICD-10-CM | POA: Insufficient documentation

## 2015-01-23 DIAGNOSIS — Z87891 Personal history of nicotine dependence: Secondary | ICD-10-CM | POA: Diagnosis not present

## 2015-01-23 DIAGNOSIS — Z79899 Other long term (current) drug therapy: Secondary | ICD-10-CM | POA: Insufficient documentation

## 2015-01-23 DIAGNOSIS — I4891 Unspecified atrial fibrillation: Secondary | ICD-10-CM

## 2015-01-23 DIAGNOSIS — E7800 Pure hypercholesterolemia, unspecified: Secondary | ICD-10-CM

## 2015-01-23 DIAGNOSIS — N183 Chronic kidney disease, stage 3 (moderate): Secondary | ICD-10-CM | POA: Diagnosis not present

## 2015-01-23 DIAGNOSIS — I480 Paroxysmal atrial fibrillation: Secondary | ICD-10-CM | POA: Insufficient documentation

## 2015-01-23 DIAGNOSIS — I129 Hypertensive chronic kidney disease with stage 1 through stage 4 chronic kidney disease, or unspecified chronic kidney disease: Secondary | ICD-10-CM | POA: Diagnosis not present

## 2015-01-23 DIAGNOSIS — Z794 Long term (current) use of insulin: Secondary | ICD-10-CM | POA: Insufficient documentation

## 2015-01-23 DIAGNOSIS — Z7982 Long term (current) use of aspirin: Secondary | ICD-10-CM | POA: Insufficient documentation

## 2015-01-23 DIAGNOSIS — R0789 Other chest pain: Secondary | ICD-10-CM | POA: Insufficient documentation

## 2015-01-23 DIAGNOSIS — E1142 Type 2 diabetes mellitus with diabetic polyneuropathy: Secondary | ICD-10-CM | POA: Insufficient documentation

## 2015-01-23 DIAGNOSIS — E1122 Type 2 diabetes mellitus with diabetic chronic kidney disease: Secondary | ICD-10-CM | POA: Insufficient documentation

## 2015-01-23 MED ORDER — ISOSORBIDE MONONITRATE ER 30 MG PO TB24: 30.0000 mg | ORAL_TABLET | Freq: Every day | ORAL | Status: DC

## 2015-01-23 MED ORDER — APIXABAN 2.5 MG PO TABS: 2.5000 mg | ORAL_TABLET | Freq: Two times a day (BID) | ORAL | Status: DC

## 2015-01-23 MED ORDER — ATORVASTATIN CALCIUM 40 MG PO TABS: 40.0000 mg | ORAL_TABLET | Freq: Every day | ORAL | Status: DC

## 2015-01-23 NOTE — Progress Notes (Signed)
Advanced Heart Failure Medication Review by a Pharmacist  Does the patient  feel that his/her medications are working for him/her?  yes  Has the patient been experiencing any side effects to the medications prescribed?  no  Does the patient measure his/her own blood pressure or blood glucose at home?  yes   Does the patient have any problems obtaining medications due to transportation or finances?   no  Understanding of regimen: good Understanding of indications: good Potential of compliance: good    Pharmacist comments:  Shelia Woods is a pleasant 81 yo F presenting with a current medication list. She reports excellent compliance with all of her medications and seems to understand the importance of continued use. She did not have any specific medication-related questions or concerns for me at this time.   Shelia Woods. Shelia Woods, PharmD, BCPS, CPP Clinical Pharmacist Pager: [phone] Phone: [phone] 01/23/2015 2:20 PM

## 2015-01-23 NOTE — Patient Instructions (Addendum)
START Imdur 30 mg, one tab daily START Eliquis 2.5 mg, one tab twice a day INCREASE Atorvastatin to 40 mg, one tab daily STOP Aspirin  Your physician has requested that you have a lexiscan myoview. For further information please visit [email or web address]. Please follow instruction sheet, as given.  Your physician has recommended that you wear an event monitor. Event monitors are medical devices that record the heart's electrical activity. Doctors most often [place] these monitors to diagnose arrhythmias. Arrhythmias are problems with the speed or rhythm of the heartbeat. The monitor is a small, portable device. You can wear one while you do your normal daily activities. This is usually used to diagnose what is causing palpitations/syncope (passing out).  Your physician recommends that you schedule a follow-up appointment in: 1 month with Dr Aundra Dubin  Do the following things EVERYDAY: 1) Weigh yourself in the morning before breakfast. Write it down and keep it in a log. 2) Take your medicines as prescribed 3) Eat low salt foods-Limit salt (sodium) to 2000 mg per day.  4) Stay as active as you can everyday 5) Limit all fluids for the day to less than 2 liters 6) C.Jeffreis,CMA

## 2015-01-23 NOTE — Progress Notes (Signed)
ADVANCED HF CLINIC NOTE  Patient ID: Shelia Woods, female   DOB: [birth year], 81 y.o.   MRN: [id number]   HPI:  Shelia Woods is an 81 y/o woman with h/o obesity, HTN, CKD stage 3-4, PAF, diabetes who is self-referred to Dr. Aundra Dubin for further evaluation of chest tightness and palpitations.   She last saw Dr. Stanford Breed in 2014. She had a nuclear stress test in 2007 that showed EF 69% and no ischemia. She has never has a cath. Renal dopplers in 2009 showed no RAS.   Over the last year she has been having progressive episodes of chest tightness particularly with emotional stress. Now gets an episode about once a week. She states it happens about once per week. Mostly when she is worried about her husband's health. Pressure lasts about 5 mins then resolves. She does not ambulate much due to her severe back pain and uses a walker or wheelchair.   Has a h/o PAF. Was started on Eliquis in past but couldn't tolerate it due to bleeding. Continues to have palpitations. Feels like she is out of rhythm at times. Mostly in the mornings.   Husband says she only snores when in very deep sleep.   Sleeps in chair due to her back. Denies edema.    Echo 12/14 EF 60-65% Grade 1 DD  Lab Results  Component Value Date   CHOL 205* 08/08/2014   HDL 41.40 08/08/2014   LDLCALC 104* 08/03/2013   LDLDIRECT 108.0 08/08/2014   TRIG 349.0* 08/08/2014   CHOLHDL 5 08/08/2014      ROS: All systems negative except as listed in HPI, PMH and Problem List.  SH:  Social History   Social History  . Marital Status: Married    Spouse Name: N/A  . Number of Children: 4  . Years of Education: N/A   Occupational History  .      retired/disabled (1994)   Social History Main Topics  . Smoking status: Former Smoker    Quit date: 04/27/1978  . Smokeless tobacco: Never Used  . Alcohol Use: No  . Drug Use: No  . Sexual Activity: Not on file   Other Topics Concern  . Not on file   Social History Narrative   Four  children   Regular exercise-no    FH:  Family History  Problem Relation Age of Onset  . Diabetes Sister   . Parkinsonism Sister     Past Medical History  Diagnosis Date  . PAROXYSMAL ATRIAL FIBRILLATION 09/29/2008  . HYPERCHOLESTEROLEMIA 06/24/2006  . HYPERTENSION, BENIGN SYSTEMIC 06/24/2006  . FATIGUE 11/22/2007  . RENAL FAILURE 11/22/2007  . RENAL INSUFFICIENCY, CHRONIC 04/10/2008  . Proteinuria 05/17/2008  . Dermatophytosis of the body 11/30/2007  . NEOP, MALIGNANT, SKIN NOS 11/08/2006  . DIABETES MELLITUS II, UNCOMPLICATED 7/67/3419  . SYNDROME, CARPAL TUNNEL 11/08/2006  . PERIPHERAL NEUROPATHY, LOWER EXTREMITY 11/08/2006  . MACULAR DEGENERATION, SENILE, NONEXUDATIVE 01/01/2010  . CATARACTS, SENILE, NUCLEAR, BILATERAL 01/01/2010  . SMALL BOWEL OBSTRUCTION 11/05/2008  . INGROWN TOENAIL, INFECTED 04/10/2008  . OSTEOARTHRITIS, LOWER LEG 06/24/2006  . MUSCLE SPASM, TRAPEZIUS 04/02/2010  . Hyperpotassemia   . Dysrhythmia     atrial fibrilation    Current Outpatient Prescriptions  Medication Sig Dispense Refill  . acetaminophen (TYLENOL) 500 MG tablet Take 1,000 mg by mouth 3 (three) times daily.     Marland Kitchen aspirin EC 81 MG tablet Take 81 mg by mouth 2 (two) times daily.     Marland Kitchen atorvastatin (LIPITOR) 20  MG tablet TAKE 1 TABLET BY MOUTH EVERY NIGHT AT BEDTIME 90 tablet 0  . Blood Glucose Monitoring Suppl (ONE TOUCH ULTRA SYSTEM KIT) W/DEVICE KIT 1 kit by Does not apply route once. E11.9 1 each 0  . bumetanide (BUMEX) 1 MG tablet Take 1 tablet (1 mg total) by mouth 2 (two) times daily. 180 tablet 1  . calcium carbonate (CALCIUM 600) 600 MG TABS tablet Take 1 tablet (600 mg total) by mouth every evening. 180 tablet 1  . cloNIDine (CATAPRES) 0.3 MG tablet TAKE 1 TABLET BY MOUTH TWICE DAILY 180 tablet 0  . fenofibrate 160 MG tablet Take 1 tablet (160 mg total) by mouth every evening. 90 tablet 1  . fluocinonide cream (LIDEX) 0.05 % APPLY EXTERNALLY TO THE AFFECTED AREA TWICE DAILY 120 g 0  .  Glucosamine-Chondroitin (OSTEO BI-FLEX REGULAR STRENGTH) 250-200 MG TABS Take 1 tablet by mouth 2 (two) times daily.     Marland Kitchen glucose blood (ONE TOUCH ULTRA TEST) test strip USE AS DIRECTED TWICE DAILY 100 each 10  . hydrALAZINE (APRESOLINE) 50 MG tablet TAKE 1 TABLET BY MOUTH THREE TIMES DAILY 90 tablet 2  . insulin NPH Human (HUMULIN N,NOVOLIN N) 100 UNIT/ML injection Inject 0.3 mLs (30 Units total) into the skin at bedtime. 3 vial 12  . insulin regular (NOVOLIN R,HUMULIN R) 100 units/mL injection Inject 0.15-0.3 mLs (15-30 Units total) into the skin 3 (three) times daily before meals. 15 units in the morning, 20 units midday and 30 units in the evening 6 vial 12  . loratadine (CLARITIN) 10 MG tablet Take 10 mg by mouth daily.    Marland Kitchen losartan-hydrochlorothiazide (HYZAAR) 100-12.5 MG tablet TAKE 1 TABLET BY MOUTH EVERY DAY 90 tablet 0  . Multiple Vitamins-Minerals (PRESERVISION AREDS 2 PO) Take 1 capsule by mouth 2 (two) times daily.     Shelia Woods DELICA LANCETS 95J MISC Use one lancet each time sugars are checked. Pt tests twice daily. 100 each 12  . polyethylene glycol (MIRALAX / GLYCOLAX) packet Take 17 g by mouth 2 (two) times daily as needed for mild constipation.    . TAZTIA XT 300 MG 24 hr capsule TAKE ONE CAPSULE BY MOUTH DAILY 90 capsule 0   No current facility-administered medications for this encounter.    Filed Vitals:   01/23/15 1403  BP: 142/74  Pulse: 61  Weight: 227 lb 12 oz (103.307 kg)  SpO2: 97%    PHYSICAL EXAM:  General:  Elderly woman Sitting in [place]. No resp difficulty HEENT: normal Neck: supple. JVP flat. Carotids 2+ bilaterally; no bruits. No lymphadenopathy or thryomegaly appreciated. Cor: PMI normal. Regular rate & rhythm. Soft SEM RUSB Lungs: clear Abdomen: soft, obese nontender, nondistended. No hepatosplenomegaly. No bruits or masses. Good bowel sounds. Extremities: no cyanosis, clubbing, rash, edema Neuro: alert & orientedx3, cranial nerves grossly intact.  Moves all 4 extremities w/o difficulty. Affect pleasant.   ECG: Sinus brady with nonspecif ST-T wave abnormalities.    ASSESSMENT & PLAN: 1. Chest pressure 2. PAF/palpitations, CHADSVAsc = 4 3. Hyperlipidemia 4. DM2 5. HTN  Shehas multiple risk factors for CAD and needs a formal ischemic work-up for her chest discomfort. We discussed cath vs nuclear stress testing. Given CKD we have decided on Lexiscan Myoview. Will add Imdur. Given LDL > 70 will increase atrova to 40 daily. Long talk about risks/benefits of anticoagulation for AF. Will start Eliquis 2.5 bid. Stop ASA. Will place 30-day even monitor to further evaluate palpitations. Likely has OSA but sleeps  in chair and husband says she doesn't snore much. Likely would have hard time tolerating sleep study. Will defer for now.   F/u 1 month with Dr. Daleen Squibb, Daniel,MD 11:55 PM

## 2015-01-23 NOTE — Telephone Encounter (Signed)
Medication filled to pharmacy as requested.   

## 2015-01-24 ENCOUNTER — Telehealth (HOSPITAL_COMMUNITY): Payer: Self-pay | Admitting: *Deleted

## 2015-01-24 NOTE — Telephone Encounter (Signed)
Patient given detailed instructions per Myocardial Perfusion Study Information Sheet for test on 01/28/15 at 1230. Patient notified to arrive 15 minutes early and that it is imperative to arrive on time for appointment to keep from having the test rescheduled.  If you need to cancel or reschedule your appointment, please call the office within 24 hours of your appointment. Failure to do so may result in a cancellation of your appointment, and a $50 no show fee. Patient verbalized understanding.Hubbard Robinson, RN

## 2015-01-24 NOTE — Telephone Encounter (Signed)
Pt called to let us know that they decided to proceed with the holter monitor but wanted to wait on the stress test, until after the holter.  Cancelled the stress test.  Pt will follow up with DM after holter monitor.

## 2015-01-28 ENCOUNTER — Ambulatory Visit (HOSPITAL_COMMUNITY): Payer: Medicare Other

## 2015-01-29 ENCOUNTER — Ambulatory Visit (HOSPITAL_COMMUNITY): Payer: Medicare Other

## 2015-01-29 ENCOUNTER — Ambulatory Visit (INDEPENDENT_AMBULATORY_CARE_PROVIDER_SITE_OTHER): Payer: Medicare Other

## 2015-01-29 DIAGNOSIS — I4891 Unspecified atrial fibrillation: Secondary | ICD-10-CM

## 2015-02-13 ENCOUNTER — Encounter: Payer: Self-pay | Admitting: Family Medicine

## 2015-02-13 ENCOUNTER — Ambulatory Visit (INDEPENDENT_AMBULATORY_CARE_PROVIDER_SITE_OTHER): Payer: Medicare Other | Admitting: Family Medicine

## 2015-02-13 VITALS — BP 140/76 | HR 60 | Temp 97.9°F | Resp 16 | Ht 66.0 in | Wt 228.0 lb

## 2015-02-13 DIAGNOSIS — E1140 Type 2 diabetes mellitus with diabetic neuropathy, unspecified: Secondary | ICD-10-CM | POA: Diagnosis not present

## 2015-02-13 DIAGNOSIS — Z23 Encounter for immunization: Secondary | ICD-10-CM

## 2015-02-13 DIAGNOSIS — M179 Osteoarthritis of knee, unspecified: Secondary | ICD-10-CM

## 2015-02-13 DIAGNOSIS — I10 Essential (primary) hypertension: Secondary | ICD-10-CM | POA: Diagnosis not present

## 2015-02-13 DIAGNOSIS — M1710 Unilateral primary osteoarthritis, unspecified knee: Secondary | ICD-10-CM

## 2015-02-13 DIAGNOSIS — E7800 Pure hypercholesterolemia, unspecified: Secondary | ICD-10-CM

## 2015-02-13 DIAGNOSIS — IMO0002 Reserved for concepts with insufficient information to code with codable children: Secondary | ICD-10-CM

## 2015-02-13 DIAGNOSIS — Z0000 Encounter for general adult medical examination without abnormal findings: Secondary | ICD-10-CM | POA: Diagnosis not present

## 2015-02-13 DIAGNOSIS — I480 Paroxysmal atrial fibrillation: Secondary | ICD-10-CM

## 2015-02-13 DIAGNOSIS — N183 Chronic kidney disease, stage 3 unspecified: Secondary | ICD-10-CM

## 2015-02-13 DIAGNOSIS — M545 Low back pain: Secondary | ICD-10-CM

## 2015-02-13 NOTE — Progress Notes (Signed)
Subjective:    Patient ID: Shelia Woods, female    DOB: March 13, 1931, 81 y.o.   MRN: [id number]  HPI Here today for CPE.  Risk Factors: DM- chronic problem, on NPH 30 units QHS and regular insulin 15-20-25.  Pt reports symptomatic lows late in the evening or overnight.  On ARB for renal protection.  Pt has eye exam scheduled for December (last done in June).  Pt does not have foot doctor.  No sores on feet. HTN- chronic problem, on Bumex, Clonidine, Hydralazine, Losartan HCTZ, Taztia Hyperlipidemia- chronic problem, on Fenofibrate and lipitor CKD- chronic problem, not currently seeing renal.  Asymptomatic. PAF- chronic problem, saw Dr Aundra Dubin.  On Eliquis daily.  Currently rate controlled on [place]. Mobility issues- pt having very difficult time getting around due to previous back surgery, current arthritis.  Is asking for a transport chair to make travel to and from appts easier.  She and her husband are not able to lift and store a regular wheelchair in the car. Physical Activity: very limited due to multiple back surgeries Fall Risk: elevated, ambulates w/ cane Depression: denies  Hearing: normal to conversational tones, decreased to whispered voice ADL's: independent Cognitive: normal linear thought process, memory and attention intact Home Safety: safe at home, lives w/ husband Height, Weight, BMI, Visual Acuity: see vitals, vision corrected to 20/20 w/ glasses Counseling: pt declines mammo and colonoscopy.  Also declines DEXA.  UTD on pneumonia vaccine, due for flu. Care team reviewed and updated w/ pt Labs Ordered: See A&P Care Plan: See A&P    Review of Systems Patient reports no vision/ hearing changes, adenopathy,fever, weight change,  persistant/recurrent hoarseness , swallowing issues, chest pain, palpitations, edema, persistant/recurrent cough, hemoptysis, dyspnea (rest/exertional/paroxysmal nocturnal), gastrointestinal bleeding (melena, rectal bleeding), abdominal pain,  significant heartburn, bowel changes, GU symptoms (dysuria, hematuria, incontinence), Gyn symptoms (abnormal  bleeding, pain),  syncope, memory loss, skin/hair/nail changes, abnormal bruising or bleeding, anxiety, or depression.   + neuropathy of feet + weakness of L leg    Objective:   Physical Exam General Appearance:    Alert, cooperative, no distress, appears stated age, obese, sitting in wheelchair  Head:    Normocephalic, without obvious abnormality, atraumatic  Eyes:    PERRL, conjunctiva/corneas clear, EOM's intact, fundi    benign, both eyes  Ears:    Normal TM's and external ear canals, both ears  Nose:   Nares normal, septum midline, mucosa normal, no drainage    or sinus tenderness  Throat:   Lips, mucosa, and tongue normal; teeth and gums normal  Neck:   Supple, symmetrical, trachea midline, no adenopathy;    Thyroid: no enlargement/tenderness/nodules  Back:     Symmetric, no curvature, ROM normal, no CVA tenderness  Lungs:     Clear to auscultation bilaterally, respirations unlabored  Chest Wall:    No tenderness or deformity   Heart:    Regular rate and rhythm, S1 and S2 normal, no murmur, rub   or gallop  Breast Exam:    Deferred at pt request  Abdomen:     Soft, non-tender, bowel sounds active all four quadrants,    no masses, no organomegaly, obese  Genitalia:    Deferred  Rectal:    Extremities:   Extremities normal, atraumatic, no cyanosis or edema  Pulses:   2+ and symmetric all extremities  Skin:   Skin color, texture, turgor normal, no rashes or lesions  Lymph nodes:   Cervical, supraclavicular, and axillary nodes normal  Neurologic:   CNII-XII intact          Assessment & Plan:

## 2015-02-13 NOTE — Patient Instructions (Signed)
Follow up in 3-4 months to recheck diabetes w/ Dr Etter Sjogren (sooner if needed) [facility] notify you of your lab results and make any changes if needed Decrease your regular insulin to 15-15-20 at meals and continue the NPH at 30 units nightly- if you continue to have low blood sugars, please let me know (or if the sugars start running consistently high) You have declined mammogram, bone density, and colonoscopy Make sure you are eating regularly and making healthy food choices We will check into the transport chair for you Call with any questions or concerns Happy Fall!!!

## 2015-02-13 NOTE — Progress Notes (Signed)
Pre visit review using our clinic review tool, if applicable. No additional management support is needed unless otherwise documented below in the visit note. 

## 2015-02-14 ENCOUNTER — Other Ambulatory Visit: Payer: Self-pay | Admitting: Family Medicine

## 2015-02-14 ENCOUNTER — Telehealth: Payer: Self-pay | Admitting: *Deleted

## 2015-02-14 LAB — CBC WITH DIFFERENTIAL/PLATELET
BASOS ABS: 0 10*3/uL (ref 0.0–0.1)
BASOS PCT: 0.4 % (ref 0.0–3.0)
EOS PCT: 1.4 % (ref 0.0–5.0)
Eosinophils Absolute: 0.1 10*3/uL (ref 0.0–0.7)
HEMATOCRIT: 35.3 % — AB (ref 36.0–46.0)
Hemoglobin: 11.3 g/dL — ABNORMAL LOW (ref 12.0–15.0)
LYMPHS ABS: 1.3 10*3/uL (ref 0.7–4.0)
LYMPHS PCT: 14.5 % (ref 12.0–46.0)
MCHC: 32 g/dL (ref 30.0–36.0)
MCV: 84.1 fl (ref 78.0–100.0)
MONOS PCT: 7.7 % (ref 3.0–12.0)
Monocytes Absolute: 0.7 10*3/uL (ref 0.1–1.0)
NEUTROS ABS: 7 10*3/uL (ref 1.4–7.7)
NEUTROS PCT: 76 % (ref 43.0–77.0)
Platelets: 385 10*3/uL (ref 150.0–400.0)
RBC: 4.2 Mil/uL (ref 3.87–5.11)
RDW: 16.2 % — ABNORMAL HIGH (ref 11.5–15.5)
WBC: 9.2 10*3/uL (ref 4.0–10.5)

## 2015-02-14 LAB — LIPID PANEL
Cholesterol: 164 mg/dL (ref 0–200)
HDL: 45.1 mg/dL (ref >39.00)
NONHDL: 118.95
Total CHOL/HDL Ratio: 4
Triglycerides: 258 mg/dL — ABNORMAL HIGH (ref 0.0–149.0)
VLDL: 51.6 mg/dL — ABNORMAL HIGH (ref 0.0–40.0)

## 2015-02-14 LAB — HEPATIC FUNCTION PANEL
ALK PHOS: 32 U/L — AB (ref 39–117)
ALT: 12 U/L (ref 0–35)
AST: 15 U/L (ref 0–37)
Albumin: 3.7 g/dL (ref 3.5–5.2)
BILIRUBIN DIRECT: 0.1 mg/dL (ref 0.0–0.3)
BILIRUBIN TOTAL: 0.3 mg/dL (ref 0.2–1.2)
TOTAL PROTEIN: 7.2 g/dL (ref 6.0–8.3)

## 2015-02-14 LAB — BASIC METABOLIC PANEL
BUN: 52 mg/dL — ABNORMAL HIGH (ref 6–23)
CALCIUM: 10.4 mg/dL (ref 8.4–10.5)
CO2: 30 meq/L (ref 19–32)
Chloride: 102 mEq/L (ref 96–112)
Creatinine, Ser: 1.71 mg/dL — ABNORMAL HIGH (ref 0.40–1.20)
GFR: 30.33 mL/min — AB (ref >60.00)
GLUCOSE: 126 mg/dL — AB (ref 70–99)
POTASSIUM: 4.1 meq/L (ref 3.5–5.1)
SODIUM: 140 meq/L (ref 135–145)

## 2015-02-14 LAB — TSH: TSH: 2.26 u[IU]/mL (ref 0.35–4.50)

## 2015-02-14 LAB — LDL CHOLESTEROL, DIRECT: Direct LDL: 89 mg/dL

## 2015-02-14 LAB — HEMOGLOBIN A1C: HEMOGLOBIN A1C: 6.6 % — AB (ref 4.6–6.5)

## 2015-02-14 NOTE — Telephone Encounter (Signed)
Medication filled to pharmacy as requested.   

## 2015-02-14 NOTE — Assessment & Plan Note (Signed)
Chronic problem.  Pt is following w/ cardiology.  Currently asymptomatic and rate controlled.  On chronic anticoagulation.  Will continue to follow.

## 2015-02-14 NOTE — Telephone Encounter (Signed)
Received transport chair order via fax from [facility]. Filled out as much as possible and forwarded to Dr. Birdie Riddle. JG//CMA

## 2015-02-14 NOTE — Assessment & Plan Note (Signed)
Pt's PE unchanged from previous.  She refuses DEXA, mammo, colonoscopy.  Flu shot given.  Check labs.  Anticipatory guidance provided.

## 2015-02-14 NOTE — Assessment & Plan Note (Signed)
Chronic problem.  Pt's mobility is very limited due to previous surgeries and current arthritis.  Based on this, she is requesting a transport chair to make it easier to get to and from appts and run errands.  Will enter DME order.

## 2015-02-14 NOTE — Assessment & Plan Note (Signed)
Chronic problem.  Pt is now having symptomatic lows- particularly in the evening.  Pt admits to not always eating 3 meals/day.  Based on this, we will decrease her mealtime insulin to 15/15/20 and she is to continue monitoring her sugars.  UTD on eye exam, foot exam done today.  On ACE/ARB for renal protection.  Check labs.  Adjust meds prn

## 2015-02-14 NOTE — Assessment & Plan Note (Signed)
Chronic problem.  Pt is not following w/ nephrology at this time.  Check labs and adjust tx plan prn.

## 2015-02-14 NOTE — Assessment & Plan Note (Signed)
Chronic problem, tolerating statin w/o difficulty.  Discussed need for healthy food choices as pt is not able to exercise.  Check labs.  Adjust meds prn

## 2015-02-14 NOTE — Assessment & Plan Note (Signed)
Chronic problem.  This is limiting pt's mobility and makes ambulating and ADLs difficulty.  Pt is requesting transport chair to assist w/ this.  DME order entered.

## 2015-02-14 NOTE — Assessment & Plan Note (Signed)
Chronic problem.  Adequate control.  Asymptomatic at this time.  Check labs.  No anticipated med changes. 

## 2015-02-15 ENCOUNTER — Other Ambulatory Visit: Payer: Self-pay | Admitting: Family Medicine

## 2015-02-15 DIAGNOSIS — G8929 Other chronic pain: Secondary | ICD-10-CM

## 2015-02-15 DIAGNOSIS — M549 Dorsalgia, unspecified: Secondary | ICD-10-CM

## 2015-02-15 DIAGNOSIS — IMO0002 Reserved for concepts with insufficient information to code with codable children: Secondary | ICD-10-CM

## 2015-02-15 DIAGNOSIS — M1710 Unilateral primary osteoarthritis, unspecified knee: Secondary | ICD-10-CM

## 2015-02-22 ENCOUNTER — Encounter (HOSPITAL_COMMUNITY): Payer: Medicare Other

## 2015-02-22 ENCOUNTER — Other Ambulatory Visit: Payer: Self-pay | Admitting: Family Medicine

## 2015-02-22 DIAGNOSIS — R7989 Other specified abnormal findings of blood chemistry: Secondary | ICD-10-CM

## 2015-03-03 ENCOUNTER — Other Ambulatory Visit: Payer: Self-pay | Admitting: Family Medicine

## 2015-03-04 NOTE — Telephone Encounter (Signed)
Medication filled to pharmacy as requested.   

## 2015-03-08 ENCOUNTER — Ambulatory Visit (HOSPITAL_COMMUNITY)
Admission: RE | Admit: 2015-03-08 | Discharge: 2015-03-08 | Disposition: A | Discharge status: Home / Self Care | Payer: Medicare Other | Source: Ambulatory Visit | Attending: Cardiology | Admitting: Cardiology

## 2015-03-08 VITALS — BP 172/60 | HR 62 | Wt 227.4 lb

## 2015-03-08 DIAGNOSIS — I480 Paroxysmal atrial fibrillation: Secondary | ICD-10-CM | POA: Insufficient documentation

## 2015-03-08 DIAGNOSIS — E785 Hyperlipidemia, unspecified: Secondary | ICD-10-CM | POA: Insufficient documentation

## 2015-03-08 DIAGNOSIS — R0789 Other chest pain: Secondary | ICD-10-CM | POA: Diagnosis not present

## 2015-03-08 DIAGNOSIS — I5032 Chronic diastolic (congestive) heart failure: Secondary | ICD-10-CM | POA: Diagnosis not present

## 2015-03-08 DIAGNOSIS — R079 Chest pain, unspecified: Secondary | ICD-10-CM | POA: Diagnosis not present

## 2015-03-08 DIAGNOSIS — E669 Obesity, unspecified: Secondary | ICD-10-CM | POA: Insufficient documentation

## 2015-03-08 DIAGNOSIS — I129 Hypertensive chronic kidney disease with stage 1 through stage 4 chronic kidney disease, or unspecified chronic kidney disease: Secondary | ICD-10-CM | POA: Insufficient documentation

## 2015-03-08 DIAGNOSIS — E119 Type 2 diabetes mellitus without complications: Secondary | ICD-10-CM | POA: Insufficient documentation

## 2015-03-08 DIAGNOSIS — Z87891 Personal history of nicotine dependence: Secondary | ICD-10-CM | POA: Insufficient documentation

## 2015-03-08 MED ORDER — HYDRALAZINE HCL 50 MG PO TABS: 75.0000 mg | ORAL_TABLET | Freq: Three times a day (TID) | ORAL | Status: DC

## 2015-03-08 MED ORDER — APIXABAN 2.5 MG PO TABS: 2.5000 mg | ORAL_TABLET | Freq: Two times a day (BID) | ORAL | Status: DC

## 2015-03-08 NOTE — Patient Instructions (Signed)
Increase Hydralazine 75 mg (1 & 1/2) Three times a day   Your physician has requested that you have an echocardiogram. Echocardiography is a painless test that uses sound waves to create images of your heart. It provides your doctor with information about the size and shape of your heart and how well your heart's chambers and valves are working. This procedure takes approximately one hour. There are no restrictions for this procedure.  We will contact you in 2 months to schedule your next appointment.

## 2015-03-08 NOTE — Progress Notes (Signed)
Advanced Heart Failure Medication Review by a Pharmacist  Does the patient  feel that his/her medications are working for him/her?  yes  Has the patient been experiencing any side effects to the medications prescribed?  no  Does the patient measure his/her own blood pressure or blood glucose at home?  yes   Does the patient have any problems obtaining medications due to transportation or finances?   no  Understanding of regimen: good Understanding of indications: good Potential of compliance: good Patient understands to avoid NSAIDs. Patient understands to avoid decongestants.  Issues to address at subsequent visits: Eliquis Patient Assistance Status   Pharmacist comments:  Ms. Kramarz is a pleasant 80 yo F presenting with her son and a current medication list. She reports excellent compliance with her medications but states that her Eliquis copay is $39/mo which is too costly for her. We will initiate a [employer] patient assistance application. She did not have any other medication-related questions or concerns for me at this time.   Medication Samples have been provided to the patient.  Drug name: Eliquis 2.5 mg  Qty: 14 tabs  LOT: [id number]  Exp.Date: 04/2016  The patient has been instructed regarding the correct time, dose, and frequency of taking this medication, including desired effects and most common side effects.   Ruta Hinds. Velva Harman, PharmD, BCPS, CPP Clinical Pharmacist Pager: [phone] Phone: [phone] 03/08/2015 2:01 PM     Time with patient: 10 minutes Preparation and documentation time: 10 minutes Total time: 20 minutes

## 2015-03-09 NOTE — Progress Notes (Signed)
Patient ID: Shelia Woods, female   DOB: [birth year], 81 y.o.   MRN: [id number]  [facility] CLINIC NOTE  Patient ID: Shelia Woods, female   DOB: [birth year], 81 y.o.   MRN: [id number] PCP: Dr. Birdie Riddle Cardiology: Dr. Aundra Dubin  Shelia Woods is an 81 y/o woman with h/o obesity, HTN, CKD stage 3-4, PAF, diabetes who is self-referred to Dr. Aundra Dubin for further evaluation of chest tightness and palpitations.   She last saw Dr. Stanford Breed in 2014. She had a nuclear stress test in 2007 that showed EF 69% and no ischemia. She has never has a cath. Renal dopplers in 2009 showed no RAS.   Over the last year she has been having progressive episodes of central chest tightness particularly with emotional stress. At last appointment, she reported an episode about 2-3 times a week. Mostly when she is worried about her husband's health. Pressure lasts about 5 mins then resolves. She does not ambulate much due to her severe back pain and uses a walker or wheelchair.   Has a h/o PAF. Was started on ASA 325 mg daily in past but couldn't tolerate it due to bleeding. At last appointment, she was having frequent runs of tachypalpitations.  Husband says she only snores when in very deep sleep.  Event monitor in 10/16 showed 1 run of atrial fibrillation.    Sleeps in chair due to her back. Denies edema.  Since last appointment, chest pain has resolved. She was started on Imdur and feels like this helped.  She has had no more palpitations since last visit.  She is tolerating Eliquis without overt bleeding.  She remains very limited by back pain.  She walks with walker, can go about 50 feet at a time.  Some dyspnea.   She decided not to have stress test done after last appointment.   ECG; NSR, left axis deviation, diffuse nonspecific T wave flattening  Labs (10/16): LDL 89, K 4.1, creatinine 1.71, hgb 11.3  PMH: 1. Obesity 2. HTN: Renal artery dopplers (2009) with no renal artery stenosis. 3. CKD III-IV 4. Type II diabetes 5.  Atrial fibrillation: Paroxysmal.  Event monitor (10/16) showed average HR 53 with 1 brief run of atrial fibrillation (controlled rate).  6. Cardiolite (2007) with EF 69%, no ischemia.  7. Hyperlipidemia 8. Chronic diastolic CHF: Echo (85/02) with EF 60-65%, mild LVH.  9. Low back pain: Severe and debilitating.   ROS: All systems negative except as listed in HPI, PMH and Problem List.  SH:  Social History   Social History  . Marital Status: Married    Spouse Name: N/A  . Number of Children: 4  . Years of Education: N/A   Occupational History  .      retired/disabled (1994)   Social History Main Topics  . Smoking status: Former Smoker    Quit date: 04/27/1978  . Smokeless tobacco: Never Used  . Alcohol Use: No  . Drug Use: No  . Sexual Activity: Not on file   Other Topics Concern  . Not on file   Social History Narrative   Four children   Regular exercise-no    FH:  Family History  Problem Relation Age of Onset  . Diabetes Sister   . Parkinsonism Sister     Current Outpatient Prescriptions  Medication Sig Dispense Refill  . acetaminophen (TYLENOL) 500 MG tablet Take 1,000 mg by mouth 3 (three) times daily.     Marland Kitchen apixaban (ELIQUIS) 2.5 MG TABS tablet Take  1 tablet (2.5 mg total) by mouth 2 (two) times daily. 180 tablet 3  . atorvastatin (LIPITOR) 40 MG tablet Take 1 tablet (40 mg total) by mouth at bedtime. 30 tablet 3  . Blood Glucose Monitoring Suppl (ONE TOUCH ULTRA SYSTEM KIT) W/DEVICE KIT 1 kit by Does not apply route once. E11.9 1 each 0  . bumetanide (BUMEX) 1 MG tablet Take 1 tablet (1 mg total) by mouth 2 (two) times daily. 180 tablet 1  . calcium carbonate (CALCIUM 600) 600 MG TABS tablet Take 1 tablet (600 mg total) by mouth every evening. 180 tablet 1  . cloNIDine (CATAPRES) 0.3 MG tablet TAKE 1 TABLET BY MOUTH TWICE DAILY 180 tablet 0  . fenofibrate 160 MG tablet Take 1 tablet (160 mg total) by mouth every evening. 90 tablet 1  . fluocinonide cream  (LIDEX) 0.05 % APPLY EXTERNALLY TO THE AFFECTED AREA TWICE DAILY 120 g 0  . Glucosamine-Chondroitin (OSTEO BI-FLEX REGULAR STRENGTH) 250-200 MG TABS Take 1 tablet by mouth 2 (two) times daily.     Marland Kitchen glucose blood (ONE TOUCH ULTRA TEST) test strip USE AS DIRECTED TWICE DAILY 100 each 10  . hydrALAZINE (APRESOLINE) 50 MG tablet Take 1.5 tablets (75 mg total) by mouth 3 (three) times daily. 135 tablet 2  . insulin NPH Human (HUMULIN N,NOVOLIN N) 100 UNIT/ML injection Inject 0.3 mLs (30 Units total) into the skin at bedtime. 3 vial 12  . insulin regular (NOVOLIN R,HUMULIN R) 100 units/mL injection Inject 15-20 Units into the skin 3 (three) times daily before meals. 15 units in the morning, 15 units midday and 20 units in the evening    . isosorbide mononitrate (IMDUR) 30 MG 24 hr tablet Take 1 tablet (30 mg total) by mouth daily. 90 tablet 3  . loratadine (CLARITIN) 10 MG tablet Take 10 mg by mouth daily as needed for allergies.     Marland Kitchen losartan-hydrochlorothiazide (HYZAAR) 100-12.5 MG tablet TAKE 1 TABLET BY MOUTH EVERY DAY 90 tablet 0  . Melatonin 1 MG TABS Take 1 mg by mouth at bedtime.    . Multiple Vitamins-Minerals (PRESERVISION AREDS 2 PO) Take 1 capsule by mouth 2 (two) times daily.     Glory Rosebush DELICA LANCETS 38S MISC Use one lancet each time sugars are checked. Pt tests twice daily. 100 each 12  . polyethylene glycol (MIRALAX / GLYCOLAX) packet Take 17 g by mouth 2 (two) times daily as needed for mild constipation.    . TAZTIA XT 300 MG 24 hr capsule TAKE 1 CAPSULE BY MOUTH EVERY DAY 30 capsule 6   No current facility-administered medications for this encounter.    Filed Vitals:   03/08/15 1345  BP: 172/60  Pulse: 62  Weight: 227 lb 6.4 oz (103.148 kg)  SpO2: 96%    PHYSICAL EXAM:  General:  Elderly woman Sitting in [place]. No resp difficulty HEENT: normal Neck: supple. JVP flat. Carotids 2+ bilaterally; no bruits. No lymphadenopathy or thryomegaly appreciated. Cor: PMI normal.  Regular rate & rhythm. Soft SEM RUSB Lungs: clear Abdomen: soft, obese nontender, nondistended. No hepatosplenomegaly. No bruits or masses. Good bowel sounds. Extremities: no cyanosis, clubbing, rash.  1+ ankle edema.  Neuro: alert & orientedx3, cranial nerves grossly intact. Moves all 4 extremities w/o difficulty. Affect pleasant.  ASSESSMENT & PLAN: 1. Chest pain: This has resolved since starting Imdur.  Currently no tightness.  She has multiple RFs for CAD, so it is certainly possible that the chest tightness was due to angina.  We had wanted her to have a Cardiolite after last appt but she cancelled the test.  At this point, with no chest pain, I think we could hold off on the stress test for now.  Would need very high threshold for coronary angiography given CKD IV.  - Continue Imdur 30 daily, no aspirin due to apixaban use, continue statin.  - I will have her get an echo to make sure that LV EF remains preserved.  2. Atrial fibrillation: Paroxysmal.  Continue apixaban (reduced dose given age and creatinine).  3. Hyperlipidemia: Continue statin.  4. HTN: BP continues to run high.  Increase hydralazine to 75 mg tid.  5. Chronic diastolic CHF: Volume status looks ok on current Bumetanide.  As above, will repeat echo.   Followup in 2 months.   Loralie Champagne 03/09/2015

## 2015-03-15 ENCOUNTER — Ambulatory Visit (HOSPITAL_COMMUNITY): Payer: Medicare Other | Attending: Cardiology

## 2015-03-15 ENCOUNTER — Other Ambulatory Visit: Payer: Self-pay

## 2015-03-15 DIAGNOSIS — I059 Rheumatic mitral valve disease, unspecified: Secondary | ICD-10-CM | POA: Diagnosis not present

## 2015-03-15 DIAGNOSIS — I5189 Other ill-defined heart diseases: Secondary | ICD-10-CM | POA: Insufficient documentation

## 2015-03-15 DIAGNOSIS — E669 Obesity, unspecified: Secondary | ICD-10-CM | POA: Diagnosis not present

## 2015-03-15 DIAGNOSIS — I5032 Chronic diastolic (congestive) heart failure: Secondary | ICD-10-CM

## 2015-03-15 DIAGNOSIS — I517 Cardiomegaly: Secondary | ICD-10-CM | POA: Insufficient documentation

## 2015-03-15 DIAGNOSIS — I10 Essential (primary) hypertension: Secondary | ICD-10-CM | POA: Insufficient documentation

## 2015-03-15 DIAGNOSIS — Z6836 Body mass index (BMI) 36.0-36.9, adult: Secondary | ICD-10-CM | POA: Diagnosis not present

## 2015-03-17 ENCOUNTER — Other Ambulatory Visit: Payer: Self-pay | Admitting: Family Medicine

## 2015-03-18 NOTE — Telephone Encounter (Signed)
Medication filled to pharmacy as requested.   

## 2015-03-26 ENCOUNTER — Telehealth (HOSPITAL_COMMUNITY): Payer: Self-pay

## 2015-03-26 NOTE — Telephone Encounter (Signed)
Barnetta Chapel from [facility] called to let us onow that patient has been approved for assistance with Eliquis through 04/27/15 and is eligible to reapply next year after she meets 3%. Any questions call: [phone]

## 2015-03-28 ENCOUNTER — Telehealth (HOSPITAL_COMMUNITY): Payer: Self-pay

## 2015-03-28 MED ORDER — APIXABAN 2.5 MG PO TABS: 2.5000 mg | ORAL_TABLET | Freq: Two times a day (BID) | ORAL | Status: DC

## 2015-03-28 NOTE — Telephone Encounter (Signed)
Patient request Eliquis quanity increase for refills.  RX # [id number] with refills sent to pharmacy

## 2015-04-09 ENCOUNTER — Other Ambulatory Visit: Payer: Self-pay | Admitting: Family Medicine

## 2015-04-09 NOTE — Telephone Encounter (Signed)
Medication filled to pharmacy as requested.   

## 2015-04-12 ENCOUNTER — Other Ambulatory Visit: Payer: Self-pay | Admitting: Family Medicine

## 2015-04-15 ENCOUNTER — Telehealth: Payer: Self-pay

## 2015-04-15 NOTE — Telephone Encounter (Signed)
Called patient for clarification regarding insulin management.  Per patient, she recently received a letter from [place] her that they will no longer cover Novolin R and Novolin N.  The alternatives listed are Humulin R and Humulin N.  Shelia Woods say that she spoke to [facility] and they said she can get insulin OTC.  Novolin R and N will be approximately $20 and Humulin R and Humulin N will be $40.  Shelia Woods says she was told that as long as they have "prescriptions on hand," she can get Novolin R and Novolin N for $20, which she prefers.

## 2015-04-16 NOTE — Telephone Encounter (Signed)
Spoke with patient and she did not need a refill at this time.         KP

## 2015-04-16 NOTE — Telephone Encounter (Signed)
Ok to send rx in for novulin

## 2015-04-17 ENCOUNTER — Other Ambulatory Visit: Payer: Self-pay | Admitting: General Practice

## 2015-04-17 ENCOUNTER — Other Ambulatory Visit: Payer: Self-pay | Admitting: Family Medicine

## 2015-04-17 MED ORDER — LOSARTAN POTASSIUM-HCTZ 100-12.5 MG PO TABS: 1.0000 | ORAL_TABLET | Freq: Every day | ORAL | Status: DC

## 2015-04-17 NOTE — Telephone Encounter (Signed)
Medication filled to pharmacy as requested.   

## 2015-04-21 ENCOUNTER — Other Ambulatory Visit: Payer: Self-pay | Admitting: Family Medicine

## 2015-04-23 NOTE — Telephone Encounter (Signed)
Medication filled to pharmacy as requested.   

## 2015-05-04 ENCOUNTER — Other Ambulatory Visit: Payer: Self-pay | Admitting: Family Medicine

## 2015-05-07 ENCOUNTER — Other Ambulatory Visit: Payer: Self-pay

## 2015-05-07 MED ORDER — ONETOUCH DELICA LANCETS 33G MISC: Status: AC

## 2015-05-08 ENCOUNTER — Other Ambulatory Visit (HOSPITAL_COMMUNITY): Payer: Self-pay | Admitting: Internal Medicine

## 2015-05-26 ENCOUNTER — Other Ambulatory Visit (HOSPITAL_COMMUNITY): Payer: Self-pay | Admitting: Cardiology

## 2015-06-03 ENCOUNTER — Other Ambulatory Visit (HOSPITAL_COMMUNITY): Payer: Self-pay | Admitting: Internal Medicine

## 2015-06-07 ENCOUNTER — Other Ambulatory Visit: Payer: Self-pay | Admitting: Family Medicine

## 2015-06-07 NOTE — Telephone Encounter (Signed)
Medication filled to pharmacy as requested.   

## 2015-06-09 ENCOUNTER — Other Ambulatory Visit: Payer: Self-pay | Admitting: Family Medicine

## 2015-06-10 NOTE — Telephone Encounter (Signed)
Medication filled to pharmacy as requested.   

## 2015-06-14 ENCOUNTER — Ambulatory Visit (HOSPITAL_COMMUNITY)
Admission: RE | Admit: 2015-06-14 | Discharge: 2015-06-14 | Disposition: A | Discharge status: Home / Self Care | Payer: Medicare Other | Source: Ambulatory Visit | Attending: Cardiology | Admitting: Cardiology

## 2015-06-14 ENCOUNTER — Encounter (HOSPITAL_COMMUNITY): Payer: Self-pay

## 2015-06-14 VITALS — BP 142/60 | HR 58 | Resp 18 | Wt 223.8 lb

## 2015-06-14 DIAGNOSIS — E785 Hyperlipidemia, unspecified: Secondary | ICD-10-CM | POA: Insufficient documentation

## 2015-06-14 DIAGNOSIS — Z833 Family history of diabetes mellitus: Secondary | ICD-10-CM | POA: Insufficient documentation

## 2015-06-14 DIAGNOSIS — Z7902 Long term (current) use of antithrombotics/antiplatelets: Secondary | ICD-10-CM | POA: Diagnosis not present

## 2015-06-14 DIAGNOSIS — N183 Chronic kidney disease, stage 3 unspecified: Secondary | ICD-10-CM

## 2015-06-14 DIAGNOSIS — Z794 Long term (current) use of insulin: Secondary | ICD-10-CM | POA: Insufficient documentation

## 2015-06-14 DIAGNOSIS — E669 Obesity, unspecified: Secondary | ICD-10-CM | POA: Diagnosis not present

## 2015-06-14 DIAGNOSIS — E1122 Type 2 diabetes mellitus with diabetic chronic kidney disease: Secondary | ICD-10-CM | POA: Insufficient documentation

## 2015-06-14 DIAGNOSIS — Z79899 Other long term (current) drug therapy: Secondary | ICD-10-CM | POA: Diagnosis not present

## 2015-06-14 DIAGNOSIS — I480 Paroxysmal atrial fibrillation: Secondary | ICD-10-CM | POA: Diagnosis not present

## 2015-06-14 DIAGNOSIS — N390 Urinary tract infection, site not specified: Secondary | ICD-10-CM | POA: Diagnosis not present

## 2015-06-14 DIAGNOSIS — I130 Hypertensive heart and chronic kidney disease with heart failure and stage 1 through stage 4 chronic kidney disease, or unspecified chronic kidney disease: Secondary | ICD-10-CM | POA: Diagnosis not present

## 2015-06-14 DIAGNOSIS — I2510 Atherosclerotic heart disease of native coronary artery without angina pectoris: Secondary | ICD-10-CM | POA: Diagnosis not present

## 2015-06-14 DIAGNOSIS — Z87891 Personal history of nicotine dependence: Secondary | ICD-10-CM | POA: Diagnosis not present

## 2015-06-14 DIAGNOSIS — I5032 Chronic diastolic (congestive) heart failure: Secondary | ICD-10-CM | POA: Insufficient documentation

## 2015-06-14 DIAGNOSIS — M545 Low back pain: Secondary | ICD-10-CM | POA: Insufficient documentation

## 2015-06-14 LAB — CBC
HEMATOCRIT: 35.7 % — AB (ref 36.0–46.0)
Hemoglobin: 11.1 g/dL — ABNORMAL LOW (ref 12.0–15.0)
MCH: 27.5 pg (ref 26.0–34.0)
MCHC: 31.1 g/dL (ref 30.0–36.0)
MCV: 88.4 fL (ref 78.0–100.0)
Platelets: 392 10*3/uL (ref 150–400)
RBC: 4.04 MIL/uL (ref 3.87–5.11)
RDW: 15.2 % (ref 11.5–15.5)
WBC: 9.1 10*3/uL (ref 4.0–10.5)

## 2015-06-14 LAB — BASIC METABOLIC PANEL
Anion gap: 13 (ref 5–15)
BUN: 42 mg/dL — AB (ref 6–20)
CHLORIDE: 103 mmol/L (ref 101–111)
CO2: 24 mmol/L (ref 22–32)
CREATININE: 1.77 mg/dL — AB (ref 0.44–1.00)
Calcium: 9.9 mg/dL (ref 8.9–10.3)
GFR calc Af Amer: 30 mL/min — ABNORMAL LOW (ref >60)
GFR calc non Af Amer: 26 mL/min — ABNORMAL LOW (ref >60)
Glucose, Bld: 165 mg/dL — ABNORMAL HIGH (ref 65–99)
POTASSIUM: 3.8 mmol/L (ref 3.5–5.1)
SODIUM: 140 mmol/L (ref 135–145)

## 2015-06-14 NOTE — Patient Instructions (Signed)
Routine lab work today. Will notify you of abnormal results, otherwise no news is good news!  Follow up 4 months.  Do the following things EVERYDAY: 1) Weigh yourself in the morning before breakfast. Write it down and keep it in a log. 2) Take your medicines as prescribed 3) Eat low salt foods-Limit salt (sodium) to 2000 mg per day.  4) Stay as active as you can everyday 5) Limit all fluids for the day to less than 2 liters  

## 2015-06-17 NOTE — Progress Notes (Signed)
Patient ID: Shelia Woods, female   DOB: 08/20/1930, 82 y.o.   MRN: [id number]  [facility] CLINIC NOTE  Patient ID: Shelia Woods, female   DOB: 11-29-1930, 82 y.o.   MRN: [id number] PCP: Dr. Birdie Woods Cardiology: Dr. Aundra Woods  Shelia Woods is an 82 y/o woman with h/o obesity, HTN, CKD stage 3-4, PAF, diabetes.    She last saw Dr. Stanford Woods in 2014. She had a nuclear stress test in 2007 that showed EF 69% and no ischemia. She has never has a cath. Renal dopplers in 2009 showed no RAS.   She does not ambulate much due to her severe back pain and uses a walker or wheelchair.   Has a h/o PAF. Event monitor in 10/16 showed 1 run of atrial fibrillation.  She is on apixaban 2.5 mg bid.   Sleeps in chair due to her back. No chest pain. She has had no more palpitations since last visit.  She is tolerating Eliquis without overt bleeding.  She remains very limited by back pain.  She walks with walker, can go about 50 feet at a time.  No significant dyspnea, main limitations are orthopedic.  Weight is down 4 lbs since last appointment.   Labs (10/16): LDL 89, K 4.1, creatinine 1.71, hgb 11.3  PMH: 1. Obesity 2. HTN: Renal artery dopplers (2009) with no renal artery stenosis. 3. CKD III-IV 4. Type II diabetes 5. Atrial fibrillation: Paroxysmal.  Event monitor (10/16) showed average HR 53 with 1 brief run of atrial fibrillation (controlled rate).  6. Cardiolite (2007) with EF 69%, no ischemia.  7. Hyperlipidemia 8. Chronic diastolic CHF: Echo (61/44) with EF 60-65%, mild LVH.  Echo (11/16) with EF 60-65%, moderate LVH.  9. Low back pain: Severe and debilitating.   ROS: All systems negative except as listed in HPI, PMH and Problem List.  SH:  Social History   Social History  . Marital Status: Married    Spouse Name: N/A  . Number of Children: 4  . Years of Education: N/A   Occupational History  .      retired/disabled (1994)   Social History Main Topics  . Smoking status: Former Smoker    Quit  date: 04/27/1978  . Smokeless tobacco: Never Used  . Alcohol Use: No  . Drug Use: No  . Sexual Activity: Not on file   Other Topics Concern  . Not on file   Social History Narrative   Four children   Regular exercise-no    FH:  Family History  Problem Relation Age of Onset  . Diabetes Sister   . Parkinsonism Sister     Current Outpatient Prescriptions  Medication Sig Dispense Refill  . acetaminophen (TYLENOL) 500 MG tablet Take 1,000 mg by mouth 3 (three) times daily.     Marland Kitchen apixaban (ELIQUIS) 2.5 MG TABS tablet Take 1 tablet (2.5 mg total) by mouth 2 (two) times daily. 180 tablet 3  . atorvastatin (LIPITOR) 40 MG tablet TAKE 1 TABLET BY MOUTH EVERY NIGHT AT BEDTIME 30 tablet 0  . Blood Glucose Monitoring Suppl (ONE TOUCH ULTRA SYSTEM KIT) W/DEVICE KIT 1 kit by Does not apply route once. E11.9 1 each 0  . bumetanide (BUMEX) 1 MG tablet TAKE 1 TABLET(1 MG) BY MOUTH TWICE DAILY 180 tablet 1  . calcium carbonate (CALCIUM 600) 600 MG TABS tablet Take 1 tablet (600 mg total) by mouth every evening. 180 tablet 1  . cloNIDine (CATAPRES) 0.3 MG tablet TAKE 1 TABLET BY  MOUTH TWICE DAILY 180 tablet 1  . fenofibrate 160 MG tablet TAKE 1 TABLET(160 MG) BY MOUTH EVERY EVENING 90 tablet 0  . fluocinonide cream (LIDEX) 0.05 % APPLY EXTERNALLY TO THE AFFECTED AREA TWICE DAILY 120 g 0  . Glucosamine-Chondroitin (OSTEO BI-FLEX REGULAR STRENGTH) 250-200 MG TABS Take 1 tablet by mouth 2 (two) times daily.     . hydrALAZINE (APRESOLINE) 50 MG tablet TAKE 1 AND 1/2 TABLET BY MOUTH THREE TIMES DAILY 135 tablet 0  . insulin NPH Human (HUMULIN N,NOVOLIN N) 100 UNIT/ML injection Inject 0.3 mLs (30 Units total) into the skin at bedtime. 3 vial 12  . insulin regular (NOVOLIN R,HUMULIN R) 100 units/mL injection Inject 15-20 Units into the skin 3 (three) times daily before meals. 15 units in the morning, 15 units midday and 20 units in the evening    . isosorbide mononitrate (IMDUR) 30 MG 24 hr tablet Take 1  tablet (30 mg total) by mouth daily. 90 tablet 3  . loratadine (CLARITIN) 10 MG tablet Take 10 mg by mouth daily as needed for allergies.     Marland Kitchen losartan-hydrochlorothiazide (HYZAAR) 100-12.5 MG tablet Take 1 tablet by mouth daily. 90 tablet 0  . Melatonin 1 MG TABS Take 1 mg by mouth at bedtime.    . Multiple Vitamins-Minerals (PRESERVISION AREDS 2 PO) Take 1 capsule by mouth 2 (two) times daily.     . ONE TOUCH ULTRA TEST test strip USE AS DIRECTED TWICE DAILY 100 each 0  . ONETOUCH DELICA LANCETS 59D MISC Use one lancet each time sugars are checked. Pt tests twice daily. 100 each 12  . polyethylene glycol (MIRALAX / GLYCOLAX) packet Take 17 g by mouth 2 (two) times daily as needed for mild constipation.    . TAZTIA XT 300 MG 24 hr capsule TAKE 1 CAPSULE BY MOUTH EVERY DAY 30 capsule 6   No current facility-administered medications for this encounter.    Filed Vitals:   06/14/15 1347  BP: 142/60  Pulse: 58  Resp: 18  Weight: 223 lb 12 oz (101.492 kg)  SpO2: 98%    PHYSICAL EXAM:  General:  Elderly woman Sitting in [place]. No resp difficulty HEENT: normal Neck: supple. JVP flat. Carotids 2+ bilaterally; no bruits. No lymphadenopathy or thryomegaly appreciated. Cor: PMI normal. Regular rate & rhythm. Soft SEM RUSB Lungs: clear Abdomen: soft, obese nontender, nondistended. No hepatosplenomegaly. No bruits or masses. Good bowel sounds. Extremities: no cyanosis, clubbing, rash.  1+ ankle edema.  Neuro: alert & orientedx3, cranial nerves grossly intact. Moves all 4 extremities w/o difficulty. Affect pleasant.  ASSESSMENT & PLAN: 1. Chest pain: No recent chest pain. She has multiple RFs for CAD, so it is certainly possible that her prior chest tightness was due to angina.  We had wanted her to have a Cardiolite in 2016 but she cancelled the test.  At this point, with no chest pain, I think we could hold off on the stress test.  Would need very high threshold for coronary angiography given  CKD.  - Continue Imdur 30 daily, no aspirin due to apixaban use, continue statin.  2. Atrial fibrillation: Paroxysmal.  Continue apixaban (reduced dose given age and creatinine).  BMET/CBC today.  3. Hyperlipidemia: Continue statin.  4. HTN: BP fairly concerned now.  5. Chronic diastolic CHF: Volume status looks ok on current Bumetanide.  Recent echo stable, EF remains normal.  6. CKD: Stage III. Her husband sees Dr Joelyn Oms, she would like to see him when her  husband sees him again.  I think this would be reasonabl.  Followup in 4 months.   Loralie Champagne 06/17/2015

## 2015-06-20 ENCOUNTER — Other Ambulatory Visit (HOSPITAL_COMMUNITY): Payer: Self-pay | Admitting: Cardiology

## 2015-06-20 ENCOUNTER — Ambulatory Visit (HOSPITAL_BASED_OUTPATIENT_CLINIC_OR_DEPARTMENT_OTHER)
Admission: RE | Admit: 2015-06-20 | Discharge: 2015-06-20 | Disposition: A | Discharge status: Home / Self Care | Payer: Medicare Other | Source: Ambulatory Visit | Attending: Family Medicine | Admitting: Family Medicine

## 2015-06-20 ENCOUNTER — Encounter: Payer: Self-pay | Admitting: Family Medicine

## 2015-06-20 ENCOUNTER — Ambulatory Visit (INDEPENDENT_AMBULATORY_CARE_PROVIDER_SITE_OTHER): Payer: Medicare Other | Admitting: Family Medicine

## 2015-06-20 VITALS — BP 142/58 | HR 60 | Temp 97.9°F | Wt 223.0 lb

## 2015-06-20 DIAGNOSIS — I708 Atherosclerosis of other arteries: Secondary | ICD-10-CM | POA: Insufficient documentation

## 2015-06-20 DIAGNOSIS — I10 Essential (primary) hypertension: Secondary | ICD-10-CM | POA: Diagnosis not present

## 2015-06-20 DIAGNOSIS — M5440 Lumbago with sciatica, unspecified side: Secondary | ICD-10-CM | POA: Insufficient documentation

## 2015-06-20 DIAGNOSIS — M545 Low back pain, unspecified: Secondary | ICD-10-CM

## 2015-06-20 DIAGNOSIS — H1013 Acute atopic conjunctivitis, bilateral: Secondary | ICD-10-CM

## 2015-06-20 DIAGNOSIS — M47896 Other spondylosis, lumbar region: Secondary | ICD-10-CM | POA: Insufficient documentation

## 2015-06-20 DIAGNOSIS — E1143 Type 2 diabetes mellitus with diabetic autonomic (poly)neuropathy: Secondary | ICD-10-CM

## 2015-06-20 DIAGNOSIS — E1142 Type 2 diabetes mellitus with diabetic polyneuropathy: Secondary | ICD-10-CM

## 2015-06-20 DIAGNOSIS — M4186 Other forms of scoliosis, lumbar region: Secondary | ICD-10-CM | POA: Diagnosis not present

## 2015-06-20 DIAGNOSIS — E1165 Type 2 diabetes mellitus with hyperglycemia: Secondary | ICD-10-CM | POA: Diagnosis not present

## 2015-06-20 DIAGNOSIS — M159 Polyosteoarthritis, unspecified: Secondary | ICD-10-CM

## 2015-06-20 DIAGNOSIS — E785 Hyperlipidemia, unspecified: Secondary | ICD-10-CM

## 2015-06-20 DIAGNOSIS — IMO0002 Reserved for concepts with insufficient information to code with codable children: Secondary | ICD-10-CM

## 2015-06-20 DIAGNOSIS — M150 Primary generalized (osteo)arthritis: Secondary | ICD-10-CM

## 2015-06-20 LAB — POCT URINALYSIS DIPSTICK
Bilirubin, UA: NEGATIVE
GLUCOSE UA: NEGATIVE
Ketones, UA: NEGATIVE
Leukocytes, UA: NEGATIVE
NITRITE UA: NEGATIVE
PH UA: 6
RBC UA: NEGATIVE
SPEC GRAV UA: 1.02
UROBILINOGEN UA: 0.2

## 2015-06-20 MED ORDER — AZELASTINE HCL 0.05 % OP SOLN: 1.0000 [drp] | Freq: Two times a day (BID) | OPHTHALMIC | Status: DC

## 2015-06-20 MED ORDER — TRAMADOL HCL 50 MG PO TABS: ORAL_TABLET | ORAL | Status: DC

## 2015-06-20 NOTE — Progress Notes (Signed)
Pre visit review using our clinic review tool, if applicable. No additional management support is needed unless otherwise documented below in the visit note. 

## 2015-06-20 NOTE — Progress Notes (Signed)
Patient ID: Shelia Woods, female    DOB: November 17, 1930  Age: 82 y.o. MRN: [id number]    Subjective:  Subjective HPI Shelia Woods presents for f/u dm and cholesterol.  She saw cardiology few days ago and everything was good per pt.    She also c/o sore on her buttocks and is requesting home health for wound care and pt.    HYPERTENSION  Blood pressure range-not checking  Chest pain- no      Dyspnea- no Lightheadedness- no   Edema- n Other side effects - no   Medication compliance: good Low salt diet- yes  DIABETES  Blood Sugar ranges-not checking  Polyuria- no New Visual problems- no Hypoglycemic symptoms- no Other side effects-no Medication compliance - good Last eye exam- done Foot exam- today  HYPERLIPIDEMIA  Medication compliance- good RUQ pain- no  Muscle aches- no Other side effects-no    Review of Systems  Constitutional: Negative for diaphoresis, appetite change, fatigue and unexpected weight change.  Eyes: Negative for pain, redness and visual disturbance.  Respiratory: Negative for cough, chest tightness, shortness of breath and wheezing.   Cardiovascular: Negative for chest pain, palpitations and leg swelling.  Endocrine: Negative for cold intolerance, heat intolerance, polydipsia, polyphagia and polyuria.  Genitourinary: Negative for dysuria, frequency and difficulty urinating.  Musculoskeletal: Positive for myalgias, back pain and arthralgias.  Skin: Positive for color change and wound.  Neurological: Negative for dizziness, light-headedness, numbness and headaches.    History Past Medical History  Diagnosis Date  . PAROXYSMAL ATRIAL FIBRILLATION 09/29/2008  . HYPERCHOLESTEROLEMIA 06/24/2006  . HYPERTENSION, BENIGN SYSTEMIC 06/24/2006  . FATIGUE 11/22/2007  . RENAL FAILURE 11/22/2007  . RENAL INSUFFICIENCY, CHRONIC 04/10/2008  . Proteinuria 05/17/2008  . Dermatophytosis of the body 11/30/2007  . NEOP, MALIGNANT, SKIN NOS 11/08/2006  . DIABETES MELLITUS II,  UNCOMPLICATED 7/35/3299  . SYNDROME, CARPAL TUNNEL 11/08/2006  . PERIPHERAL NEUROPATHY, LOWER EXTREMITY 11/08/2006  . MACULAR DEGENERATION, SENILE, NONEXUDATIVE 01/01/2010  . CATARACTS, SENILE, NUCLEAR, BILATERAL 01/01/2010  . SMALL BOWEL OBSTRUCTION 11/05/2008  . INGROWN TOENAIL, INFECTED 04/10/2008  . OSTEOARTHRITIS, LOWER LEG 06/24/2006  . MUSCLE SPASM, TRAPEZIUS 04/02/2010  . Hyperpotassemia   . Dysrhythmia     atrial fibrilation    She has past surgical history that includes Carpal tunnel release; Appendectomy; Abdominal hysterectomy; Oophorectomy; Tonsillectomy; and Spinal fusion.   Her family history includes Diabetes in her sister; Parkinsonism in her sister.She reports that she quit smoking about 37 years ago. She has never used smokeless tobacco. She reports that she does not drink alcohol or use illicit drugs.  Current Outpatient Prescriptions on File Prior to Visit  Medication Sig Dispense Refill  . acetaminophen (TYLENOL) 500 MG tablet Take 1,000 mg by mouth 3 (three) times daily.     Marland Kitchen apixaban (ELIQUIS) 2.5 MG TABS tablet Take 1 tablet (2.5 mg total) by mouth 2 (two) times daily. 180 tablet 3  . atorvastatin (LIPITOR) 40 MG tablet TAKE 1 TABLET BY MOUTH EVERY NIGHT AT BEDTIME 30 tablet 0  . Blood Glucose Monitoring Suppl (ONE TOUCH ULTRA SYSTEM KIT) W/DEVICE KIT 1 kit by Does not apply route once. E11.9 1 each 0  . bumetanide (BUMEX) 1 MG tablet TAKE 1 TABLET(1 MG) BY MOUTH TWICE DAILY 180 tablet 1  . calcium carbonate (CALCIUM 600) 600 MG TABS tablet Take 1 tablet (600 mg total) by mouth every evening. 180 tablet 1  . cloNIDine (CATAPRES) 0.3 MG tablet TAKE 1 TABLET BY MOUTH TWICE DAILY 180 tablet  1  . fenofibrate 160 MG tablet TAKE 1 TABLET(160 MG) BY MOUTH EVERY EVENING 90 tablet 0  . fluocinonide cream (LIDEX) 0.05 % APPLY EXTERNALLY TO THE AFFECTED AREA TWICE DAILY 120 g 0  . Glucosamine-Chondroitin (OSTEO BI-FLEX REGULAR STRENGTH) 250-200 MG TABS Take 1 tablet by mouth 2  (two) times daily.     . insulin NPH Human (HUMULIN N,NOVOLIN N) 100 UNIT/ML injection Inject 0.3 mLs (30 Units total) into the skin at bedtime. 3 vial 12  . insulin regular (NOVOLIN R,HUMULIN R) 100 units/mL injection Inject 15-20 Units into the skin 3 (three) times daily before meals. 15 units in the morning, 15 units midday and 20 units in the evening    . isosorbide mononitrate (IMDUR) 30 MG 24 hr tablet Take 1 tablet (30 mg total) by mouth daily. 90 tablet 3  . loratadine (CLARITIN) 10 MG tablet Take 10 mg by mouth daily as needed for allergies.     Marland Kitchen losartan-hydrochlorothiazide (HYZAAR) 100-12.5 MG tablet Take 1 tablet by mouth daily. 90 tablet 0  . Melatonin 1 MG TABS Take 1 mg by mouth at bedtime.    . Multiple Vitamins-Minerals (PRESERVISION AREDS 2 PO) Take 1 capsule by mouth 2 (two) times daily.     . ONE TOUCH ULTRA TEST test strip USE AS DIRECTED TWICE DAILY 100 each 0  . ONETOUCH DELICA LANCETS 32K MISC Use one lancet each time sugars are checked. Pt tests twice daily. 100 each 12  . polyethylene glycol (MIRALAX / GLYCOLAX) packet Take 17 g by mouth 2 (two) times daily as needed for mild constipation.    . TAZTIA XT 300 MG 24 hr capsule TAKE 1 CAPSULE BY MOUTH EVERY DAY 30 capsule 6   No current facility-administered medications on file prior to visit.     Objective:  Objective Physical Exam  Constitutional: She is oriented to person, place, and time. She appears well-developed and well-nourished.  HENT:  Head: Normocephalic and atraumatic.  Eyes: Conjunctivae and EOM are normal.  Neck: Normal range of motion. Neck supple. No JVD present. Carotid bruit is not present. No thyromegaly present.  Cardiovascular: Normal rate, regular rhythm and normal heart sounds.   No murmur heard. Pulmonary/Chest: Effort normal and breath sounds normal. No respiratory distress. She has no wheezes. She has no rales. She exhibits no tenderness.  Musculoskeletal: She exhibits no edema.    Neurological: She is alert and oriented to person, place, and time.  Skin:     Psychiatric: She has a normal mood and affect.  Nursing note and vitals reviewed.  BP 142/58 mmHg  Pulse 60  Temp(Src) 97.9 F (36.6 C) (Oral)  Wt 223 lb (101.152 kg)  SpO2 98% Wt Readings from Last 3 Encounters:  06/20/15 223 lb (101.152 kg)  06/14/15 223 lb 12 oz (101.492 kg)  03/08/15 227 lb 6.4 oz (103.148 kg)     Lab Results  Component Value Date   WBC 9.1 06/14/2015   HGB 11.1* 06/14/2015   HCT 35.7* 06/14/2015   PLT 392 06/14/2015   GLUCOSE 141* 06/20/2015   CHOL 185 06/20/2015   TRIG 285.0* 06/20/2015   HDL 47.10 06/20/2015   LDLDIRECT 93.0 06/20/2015   LDLCALC 104* 08/03/2013   ALT 15 06/20/2015   AST 17 06/20/2015   NA 138 06/20/2015   K 3.7 06/20/2015   CL 101 06/20/2015   CREATININE 1.58* 06/20/2015   BUN 41* 06/20/2015   CO2 30 06/20/2015   TSH 2.26 02/13/2015   INR  1.1 10/17/2008   HGBA1C 6.5 06/20/2015   MICROALBUR 28.8* 06/17/2009    No results found.   Assessment & Plan:  Plan I am having Shelia Woods start on azelastine and traMADol. I am also having her maintain her Glucosamine-Chondroitin, acetaminophen, Multiple Vitamins-Minerals (PRESERVISION AREDS 2 PO), polyethylene glycol, insulin NPH Human, calcium carbonate, ONE TOUCH ULTRA SYSTEM KIT, loratadine, isosorbide mononitrate, TAZTIA XT, Melatonin, insulin regular, apixaban, bumetanide, cloNIDine, losartan-hydrochlorothiazide, ONE TOUCH ULTRA TEST, ONETOUCH DELICA LANCETS 97N, atorvastatin, fenofibrate, and fluocinonide cream.  Meds ordered this encounter  Medications  . azelastine (OPTIVAR) 0.05 % ophthalmic solution    Sig: Place 1 drop into both eyes 2 (two) times daily.    Dispense:  6 mL    Refill:  12  . traMADol (ULTRAM) 50 MG tablet    Sig: 1/2 -1 tab po q6h prn    Dispense:  30 tablet    Refill:  0    Problem List Items Addressed This Visit    None    Visit Diagnoses    Uncontrolled type II  diabetes with peripheral autonomic neuropathy (HCC)  (Chronic)  (Active)  -  Primary    Relevant Orders    Comp Met (CMET) (Completed)    Hemoglobin A1c (Completed)    Lipid panel (Completed)    POCT urinalysis dipstick (Completed)    Ambulatory referral to [facility]    Uncontrolled type II diabetes with peripheral autonomic neuropathy (HCC)        Relevant Orders    Comp Met (CMET) (Completed)    Hemoglobin A1c (Completed)    Lipid panel (Completed)    POCT urinalysis dipstick (Completed)    Ambulatory referral to Home Health    Hyperlipidemia        Relevant Orders    Comp Met (CMET) (Completed)    Lipid panel (Completed)    Ambulatory referral to [facility]        Relevant Orders    Comp Met (CMET) (Completed)    POCT urinalysis dipstick (Completed)    Ambulatory referral to Home Health    Allergic conjunctivitis, bilateral        Relevant Medications    azelastine (OPTIVAR) 0.05 % ophthalmic solution    Other Relevant Orders    Ambulatory referral to [place]    Primary osteoarthritis involving multiple joints        Relevant Medications    traMADol (ULTRAM) 50 MG tablet    Other Relevant Orders    Ambulatory referral to [place]    Diabetic peripheral neuropathy ([place])        Relevant Orders    Ambulatory referral to Home Health    Low back pain with radiation, unspecified laterality        Relevant Medications    traMADol (ULTRAM) 50 MG tablet    Other Relevant Orders    DG Lumbar Spine Complete (Completed)       Follow-up: Return in about 1 year (around 06/19/2016), or if symptoms worsen or fail to improve, for hypertension, hyperlipidemia, diabetes II.  Garnet Koyanagi, DO

## 2015-06-21 ENCOUNTER — Telehealth: Payer: Self-pay | Admitting: Family Medicine

## 2015-06-21 ENCOUNTER — Other Ambulatory Visit: Payer: Self-pay | Admitting: Family Medicine

## 2015-06-21 ENCOUNTER — Encounter (HOSPITAL_COMMUNITY): Payer: Self-pay

## 2015-06-21 LAB — COMPREHENSIVE METABOLIC PANEL
ALT: 15 U/L (ref 0–35)
AST: 17 U/L (ref 0–37)
Albumin: 3.5 g/dL (ref 3.5–5.2)
Alkaline Phosphatase: 35 U/L — ABNORMAL LOW (ref 39–117)
BUN: 41 mg/dL — ABNORMAL HIGH (ref 6–23)
CALCIUM: 9.9 mg/dL (ref 8.4–10.5)
CHLORIDE: 101 meq/L (ref 96–112)
CO2: 30 meq/L (ref 19–32)
Creatinine, Ser: 1.58 mg/dL — ABNORMAL HIGH (ref 0.40–1.20)
GFR: 33.2 mL/min — AB (ref >60.00)
GLUCOSE: 141 mg/dL — AB (ref 70–99)
POTASSIUM: 3.7 meq/L (ref 3.5–5.1)
Sodium: 138 mEq/L (ref 135–145)
Total Bilirubin: 0.2 mg/dL (ref 0.2–1.2)
Total Protein: 7.1 g/dL (ref 6.0–8.3)

## 2015-06-21 LAB — LIPID PANEL
CHOLESTEROL: 185 mg/dL (ref 0–200)
HDL: 47.1 mg/dL (ref >39.00)
NonHDL: 137.48
TRIGLYCERIDES: 285 mg/dL — AB (ref 0.0–149.0)
Total CHOL/HDL Ratio: 4
VLDL: 57 mg/dL — AB (ref 0.0–40.0)

## 2015-06-21 LAB — HEMOGLOBIN A1C: Hgb A1c MFr Bld: 6.5 % (ref 4.6–6.5)

## 2015-06-21 LAB — LDL CHOLESTEROL, DIRECT: LDL DIRECT: 93 mg/dL

## 2015-06-21 MED ORDER — POLYMYXIN B-TRIMETHOPRIM 10000-0.1 UNIT/ML-% OP SOLN: 2.0000 [drp] | Freq: Three times a day (TID) | OPHTHALMIC | Status: DC

## 2015-06-21 NOTE — Progress Notes (Signed)
Nephrology referral faxed to [facility] with necessary medical records. Attn: Dr. Pearson Grippe. Copy of referral scanned into patient's electronic medical records.  Renee Pain

## 2015-06-21 NOTE — Telephone Encounter (Signed)
noted 

## 2015-06-21 NOTE — Telephone Encounter (Signed)
Caller name: Lexine Baton  Can be reached: [phone]   Reason for call: Received referral for patient. Has called to set up first visit. Patient requested to start service on Monday so they will have a nurse see her on Monday 2/27

## 2015-06-21 NOTE — Telephone Encounter (Signed)
FYI for MD.      KP 

## 2015-06-21 NOTE — Telephone Encounter (Signed)
Ok for polytrim ophthalmic- 2 drops each eye 3x/day x7 days

## 2015-06-21 NOTE — Telephone Encounter (Signed)
Medications called in. Patient notified.

## 2015-06-21 NOTE — Telephone Encounter (Signed)
Pharmacy: [id number] - [place], [address] AT [place]  Reason for call: pt said that her eye was stuck shut this morning. Dr. Etter Sjogren told her yesterday that if it was stuck shut to call and she would call in a different eye drop. Pt was aware Dr. Etter Sjogren is out of office today and states she was told Dr. Birdie Riddle would be able to call it in for her.

## 2015-06-24 ENCOUNTER — Other Ambulatory Visit: Payer: Self-pay | Admitting: Family Medicine

## 2015-06-24 NOTE — Telephone Encounter (Signed)
Medication filled to pharmacy as requested.   

## 2015-06-27 ENCOUNTER — Telehealth: Payer: Self-pay | Admitting: Family Medicine

## 2015-06-27 NOTE — Telephone Encounter (Signed)
Caller name: [place] Relation to pt: PT [facility]  Call back number: [phone]    Reason for call:  Requesting verbal orders from home physical therapy effective this week frequency 1x 1, 2x 3 and 1x 1

## 2015-06-27 NOTE — Telephone Encounter (Signed)
Verbal left on the VM for PT.   KP

## 2015-07-03 ENCOUNTER — Telehealth: Payer: Self-pay | Admitting: *Deleted

## 2015-07-03 NOTE — Telephone Encounter (Signed)
Received Home Health Certification & Plan of Care; forwarded to provider/SLS 03/08  

## 2015-07-08 ENCOUNTER — Telehealth: Payer: Self-pay | Admitting: *Deleted

## 2015-07-08 NOTE — Telephone Encounter (Signed)
Received Physician Verbal Order; forwarded to provider/SLS 03/13

## 2015-07-09 ENCOUNTER — Telehealth: Payer: Self-pay | Admitting: Family Medicine

## 2015-07-09 ENCOUNTER — Other Ambulatory Visit (HOSPITAL_COMMUNITY): Payer: Self-pay | Admitting: Internal Medicine

## 2015-07-09 NOTE — Telephone Encounter (Signed)
Patient has been made aware and verbalized understanding, she has the Tramadol and will try it tonight, I will call [place] in the morning to follow up with them.    KP

## 2015-07-09 NOTE — Telephone Encounter (Signed)
Can be reached: [phone] Pharmacy: [id number] - [place], [address]  Reason for call:  Pt husband called stating pts bed sores are not improving with neosporin. He is asking that something stronger be called in to the pharmacy for her. He said they have to have someone take them and they are going to the pharmacy to pick up something else around 12:00pm-1:00pm today. He asked if we could send prior to. I advised him I would request but cannot tell him for certain IF anything will be called in or a definite time IF it is.

## 2015-07-09 NOTE — Telephone Encounter (Signed)
I spoke with the patient and she stated the wound is not healing, it has been bleeding and all they are putting on it is neosporin, he husband get her the neosporin with pain relief and that is not helping, she wanted to know if something could be send in that is a cream that helps with the pain relief. Please advise    KP

## 2015-07-09 NOTE — Telephone Encounter (Signed)
Please advise      KP 

## 2015-07-09 NOTE — Telephone Encounter (Signed)
Try to call [facility] [place] tomorrow There is no topical that we can put on an open wound We can try some ultram for tonight 50 mg #30  1 po q8h prn  If care [place] is unable to do anything else we can refer to wound care put she would need a ride--- ask [facility]?

## 2015-07-09 NOTE — Telephone Encounter (Signed)
I though home health was coming out to take care of the sores.

## 2015-07-10 ENCOUNTER — Telehealth: Payer: Self-pay | Admitting: Family Medicine

## 2015-07-10 NOTE — Telephone Encounter (Signed)
Spoke with [facility] and Lexine Baton made me aware that the patient is receiving wound care and they are scheduled to go back to the home tomorrow. She will have the wound care nurse check the area and give [place] a call and let us know if the wound is improving, and if Dr.Lowne has any recommendations at the time we can make them aware once they see what is going on at the site. I made the patient aware of the conversation and she stated that she is feeling better, she said the Tramadol has helped with the pain.      KP

## 2015-07-10 NOTE — Telephone Encounter (Signed)
Caller name: Allayne Stack [facility] from [facility] Call back number: [phone]    Reason for call:  Patient requesting to hold off on physical therapy until her wounds on buttocks are healed.

## 2015-07-10 NOTE — Telephone Encounter (Signed)
BENSIMHON REFILL. THANK YOU FOR YOUR TIME. 

## 2015-07-11 ENCOUNTER — Telehealth: Payer: Self-pay

## 2015-07-11 MED ORDER — TRIAMCINOLONE ACETONIDE 0.1 % EX CREA: 1.0000 "application " | TOPICAL_CREAM | Freq: Two times a day (BID) | CUTANEOUS | Status: DC

## 2015-07-11 NOTE — Telephone Encounter (Signed)
Spoke with Sharyn Lull earlier today and she stated the patient's would have completely epithelialized and the stage 2 at the coccyx is resolved. The wound is not open but is scabbed with some weeping at the scab. She said there is no discoloration with good healing and no visual infection. She used triamcinolone and the patient stated that has help and she is requesting an RX. She said she used it to help prevent the new skin from being irritated. She is requesting that the patient take Zinc and Vitamin C to help promote the healing process.  The only concern that Sharyn Lull has is that the patient blood sugars have been dropping at night, she said it is dropping to the low 60's and her husband is having to give her Orange juice int he middle of the night, she said the typically eat their biggest meal at lunch and they eat light at dinner and she thinks the night time insulin is too much. Please advise      KP

## 2015-07-11 NOTE — Telephone Encounter (Signed)
Verbal given to wait 2 weeks for PT. [place] agreed to follow up in 2 weeks with the patient.    KP

## 2015-07-11 NOTE — Telephone Encounter (Signed)
Postpone 2 weeks

## 2015-07-11 NOTE — Telephone Encounter (Signed)
-----   Message from Margot Ables sent at 07/11/2015 11:29 AM EDT ----- Regarding: FW: Call Back Contact: [phone]   ----- Message -----    From: Quintin Alto    Sent: 07/11/2015  10:39 AM      To: Margot Ables Subject: Call Back                                      Sharyn Lull wants you to call her back. She is at the patient home and will describe the wound

## 2015-07-11 NOTE — Telephone Encounter (Signed)
Spoke with Juanda Crumble and he stated he would pick up the triamcinolone tomorrow and he will have her stop her Night time Insulin. He will continue to check her CBG's and call if any concerns.    KP

## 2015-07-11 NOTE — Telephone Encounter (Signed)
tramcinolone 80g tube apply bid x 2 weeks  Dec night time insulin to 25 u ----- if still runs low dec to 20 u

## 2015-07-11 NOTE — Telephone Encounter (Signed)
To MD to advise.      KP 

## 2015-07-12 ENCOUNTER — Telehealth: Payer: Self-pay | Admitting: Family Medicine

## 2015-07-12 ENCOUNTER — Telehealth: Payer: Self-pay | Admitting: *Deleted

## 2015-07-12 NOTE — Telephone Encounter (Addendum)
Received physician verbal order [x2]; forwarded to provider/SLS 03/17

## 2015-07-12 NOTE — Telephone Encounter (Signed)
Can be reached: [phone] Pharmacy: [facility] where they got transport chair  Reason for call: pt needing RX for 2 gel pillows so that [facility] will pay for them. They cannot afford them at regular price. They are to help with pts neck problems.

## 2015-07-13 ENCOUNTER — Other Ambulatory Visit (HOSPITAL_COMMUNITY): Payer: Self-pay | Admitting: Cardiology

## 2015-07-13 ENCOUNTER — Other Ambulatory Visit: Payer: Self-pay | Admitting: Family Medicine

## 2015-07-15 ENCOUNTER — Other Ambulatory Visit (HOSPITAL_COMMUNITY): Payer: Self-pay | Admitting: *Deleted

## 2015-07-15 MED ORDER — ATORVASTATIN CALCIUM 40 MG PO TABS: 40.0000 mg | ORAL_TABLET | Freq: Every day | ORAL | Status: DC

## 2015-07-15 NOTE — Telephone Encounter (Signed)
Medication filled to pharmacy as requested.   

## 2015-07-18 ENCOUNTER — Other Ambulatory Visit: Payer: Self-pay | Admitting: Family Medicine

## 2015-07-23 ENCOUNTER — Telehealth: Payer: Self-pay | Admitting: Family Medicine

## 2015-07-23 NOTE — Telephone Encounter (Signed)
We can refer to wound care if we can get family to take her

## 2015-07-23 NOTE — Telephone Encounter (Signed)
VO given. Angie said the wound is getting worst and if it does not improve with the below changes she will let us know.     KP

## 2015-07-23 NOTE — Telephone Encounter (Signed)
Caller name: Angie with [facility] Can be reached: [phone]  Reason for call: has wound on right side that is really red. Wound on left side skin is starting to break. They are dressing the wound but it won't stay on. They are asking if bordered foam dressing might stay on better and protect the wounds. Please call with VO.

## 2015-07-28 ENCOUNTER — Other Ambulatory Visit: Payer: Self-pay | Admitting: Family Medicine

## 2015-07-29 ENCOUNTER — Telehealth: Payer: Self-pay | Admitting: Family Medicine

## 2015-07-29 ENCOUNTER — Telehealth: Payer: Self-pay | Admitting: Cardiology

## 2015-07-29 ENCOUNTER — Telehealth: Payer: Self-pay | Admitting: *Deleted

## 2015-07-29 NOTE — Telephone Encounter (Signed)
Last seen 06/20/15 and filled 06/10/15 #120g.   Please advise     KP

## 2015-07-29 NOTE — Telephone Encounter (Signed)
[facility] - Physical Therapist  Phone: [phone]   Called in to inform PCP that pt is still refusing home health services. Called pt yesterday, pt says that she still would like to hold off on PT. Please advise.

## 2015-07-29 NOTE — Telephone Encounter (Signed)
Pt and husband called.  When her dressing is changed on buttocks. She bleeds a lot, today has bled even more.  We discussed increased risk of stroke off Eliquis, but she will hod tonight and get Dr. Claris Gladden opinion tomorrow.  She may need to see surgery?

## 2015-07-29 NOTE — Telephone Encounter (Signed)
To MD to advise.      KP 

## 2015-07-29 NOTE — Telephone Encounter (Signed)
Try holding Eliquis x 3 days then resume.

## 2015-07-29 NOTE — Telephone Encounter (Signed)
Received Physician Verbal Order; forwarded to provider/SLS 04/03

## 2015-07-30 ENCOUNTER — Telehealth: Payer: Self-pay | Admitting: *Deleted

## 2015-07-30 ENCOUNTER — Other Ambulatory Visit: Payer: Self-pay | Admitting: Family Medicine

## 2015-07-30 ENCOUNTER — Other Ambulatory Visit: Payer: Self-pay

## 2015-07-30 ENCOUNTER — Telehealth: Payer: Self-pay | Admitting: Family Medicine

## 2015-07-30 DIAGNOSIS — L8990 Pressure ulcer of unspecified site, unspecified stage: Secondary | ICD-10-CM

## 2015-07-30 MED ORDER — INSULIN NPH (HUMAN) (ISOPHANE) 100 UNIT/ML ~~LOC~~ SUSP: 30.0000 [IU] | Freq: Every day | SUBCUTANEOUS | Status: DC

## 2015-07-30 MED ORDER — INSULIN REGULAR HUMAN 100 UNIT/ML IJ SOLN: 15.0000 [IU] | Freq: Three times a day (TID) | INTRAMUSCULAR | Status: DC

## 2015-07-30 MED ORDER — INSULIN REGULAR HUMAN 100 UNIT/ML IJ SOLN: INTRAMUSCULAR | Status: AC

## 2015-07-30 NOTE — Telephone Encounter (Signed)
Received [3] sets of Orders for patient; all forwarded to provider/SLS 04/04

## 2015-07-30 NOTE — Telephone Encounter (Signed)
Caller name: Juanda Crumble Relationship to patient: husband Can be reached: [phone]  Reason for call: Pt was up on night with wound on her bottom bleeding. The nurses from [facility] has been using adhesive bandages and they deteriorate the skin around the wound. They took Eliquis yesterday morning and called on call for Dr. Aundra Dubin last night told them not to take it last night. They were told to call here today and determine if the Eliquis should be reduced to 1/day. They are also asking about being set up with wound care and possibly wound vac (as they've heard this from nursing).

## 2015-07-30 NOTE — Telephone Encounter (Signed)
Please advise      KP 

## 2015-07-30 NOTE — Telephone Encounter (Signed)
Spoke with the patient and her husband and advised them both that the patient is to hold the eliquis for 3 days and them resume, but to call Dr.McLeans's office today for clarification. She agreed to do so, the referral has been placed and Insulin has been faxed.    KP

## 2015-07-30 NOTE — Telephone Encounter (Signed)
Dr Oleh Genin note said hold for 3 days and restart----- and to call him today Ok to put wound clinic referral in

## 2015-07-30 NOTE — Telephone Encounter (Signed)
Pt husband called again about below. He said he needs to know asap and also said that he needs Insulin N and Insulin R called to [facility] on [address] today asap because they pay a caregiver $25/hr and she will be going today.

## 2015-07-30 NOTE — Telephone Encounter (Signed)
Pt being sent to wound clinic

## 2015-08-01 ENCOUNTER — Telehealth: Payer: Self-pay | Admitting: Family Medicine

## 2015-08-01 ENCOUNTER — Telehealth: Payer: Self-pay

## 2015-08-01 MED ORDER — INSULIN NPH (HUMAN) (ISOPHANE) 100 UNIT/ML ~~LOC~~ SUSP: 30.0000 [IU] | Freq: Every day | SUBCUTANEOUS | Status: AC

## 2015-08-01 NOTE — Telephone Encounter (Signed)
Rx for NPH insulin was sent to [facility]. Pt requested it to be sent to [facility]. New rx sent to [facility]. Attempted to cancel rx at [facility], but they stated the rx had been transferred to [facility] yesterday. [place] and verified they received transferred rx and canceled new rx. Pt notified that rx will be available for pick-up at [facility].

## 2015-08-01 NOTE — Telephone Encounter (Signed)
Received a call from [place] at [facility] and she stated that the patient is declining their services until she sees the wound center. She asked when the appointment was scheduled and when I reviewed the chart it looks like Marj placed the referral for [facility] to come back out the home for services, I spoke with Danise Mina who will get the patient an apt at the wound center.    KP

## 2015-08-01 NOTE — Telephone Encounter (Signed)
Caller name: Juanda Crumble Relationship to patient: Husband Can be reached: [phone]   Pharmacy:  [facility], [place] [phone] (Phone) [phone] (Fax)       Reason for call: Husband states that pharmacy says they did not receive the rx for the patients insulin. Plse adv

## 2015-08-01 NOTE — Telephone Encounter (Signed)
Shelia Woods, [place] [address] [phone] Appt 08/16/15 @12 :30, pt aware

## 2015-08-01 NOTE — Telephone Encounter (Signed)
PATIENT AWARE

## 2015-08-14 ENCOUNTER — Encounter (HOSPITAL_BASED_OUTPATIENT_CLINIC_OR_DEPARTMENT_OTHER): Payer: Medicare Other | Attending: Surgery

## 2015-08-14 ENCOUNTER — Other Ambulatory Visit: Payer: Self-pay | Admitting: Family Medicine

## 2015-08-14 DIAGNOSIS — E1140 Type 2 diabetes mellitus with diabetic neuropathy, unspecified: Secondary | ICD-10-CM | POA: Diagnosis not present

## 2015-08-14 DIAGNOSIS — Z7901 Long term (current) use of anticoagulants: Secondary | ICD-10-CM | POA: Insufficient documentation

## 2015-08-14 DIAGNOSIS — I4891 Unspecified atrial fibrillation: Secondary | ICD-10-CM | POA: Insufficient documentation

## 2015-08-14 DIAGNOSIS — I10 Essential (primary) hypertension: Secondary | ICD-10-CM | POA: Diagnosis not present

## 2015-08-14 DIAGNOSIS — Z6836 Body mass index (BMI) 36.0-36.9, adult: Secondary | ICD-10-CM | POA: Insufficient documentation

## 2015-08-14 DIAGNOSIS — Z993 Dependence on wheelchair: Secondary | ICD-10-CM | POA: Insufficient documentation

## 2015-08-14 DIAGNOSIS — L89322 Pressure ulcer of left buttock, stage 2: Secondary | ICD-10-CM | POA: Insufficient documentation

## 2015-08-14 NOTE — Telephone Encounter (Signed)
Last seen and filled 06/20/15 #30   Please advise    KP

## 2015-08-16 ENCOUNTER — Encounter (HOSPITAL_BASED_OUTPATIENT_CLINIC_OR_DEPARTMENT_OTHER): Payer: Medicare Other

## 2015-08-20 DIAGNOSIS — L89322 Pressure ulcer of left buttock, stage 2: Secondary | ICD-10-CM | POA: Diagnosis not present

## 2015-08-21 ENCOUNTER — Telehealth: Payer: Self-pay | Admitting: Family Medicine

## 2015-08-21 NOTE — Telephone Encounter (Signed)
Please check with wound clinic.----- if they feel it is ok ---- if someone can ok a few pain pills to get her through next week I would appreciate it.  ---- i think we tried at one point to give her something and she refused

## 2015-08-21 NOTE — Telephone Encounter (Signed)
Please advise      KP 

## 2015-08-21 NOTE — Telephone Encounter (Signed)
Caller name: Juanda Crumble Relationship to patient: Husband Can be reached: [phone] Pharmacy:  [id number] - [place], [address] AT [facility] [address] [phone] (Phone) [phone] (Fax)        Reason for call: Husband called stating that patient is in severe pain from her wound. States that she was not given any pain medication from the wound clinic and the Tramadol is not working. Request stronger pain med.  Also request that Tramadol be increased to 90 tablets.

## 2015-08-22 NOTE — Telephone Encounter (Addendum)
The patient is not wanting to change the Tramadol she would like a 90 day supply, she said she is taking the Tramadol twice a day. Please advise in Dr.Lowne absence.    KP

## 2015-08-22 NOTE — Telephone Encounter (Signed)
Am willing to cover a 30 day supply of Tramadol same sig, #60

## 2015-08-23 MED ORDER — TRAMADOL HCL 50 MG PO TABS
ORAL_TABLET | ORAL | Status: DC
Start: 2015-08-23 — End: 2015-10-18

## 2015-08-23 NOTE — Telephone Encounter (Signed)
Rx signed.  Called to make family aware.  Left a message for call back.

## 2015-08-23 NOTE — Addendum Note (Signed)
Addended by: Rudene Anda on: 08/23/2015 10:58 AM   Modules accepted: Orders

## 2015-08-23 NOTE — Telephone Encounter (Signed)
Rx printed and forwarded to Dr. Charlett Blake for review and signature.

## 2015-08-26 NOTE — Telephone Encounter (Addendum)
Rx faxed.  Pt notified and made aware.

## 2015-08-27 ENCOUNTER — Encounter (HOSPITAL_BASED_OUTPATIENT_CLINIC_OR_DEPARTMENT_OTHER): Payer: Medicare Other | Attending: Surgery

## 2015-08-27 ENCOUNTER — Telehealth: Payer: Self-pay | Admitting: *Deleted

## 2015-08-27 DIAGNOSIS — I4891 Unspecified atrial fibrillation: Secondary | ICD-10-CM | POA: Insufficient documentation

## 2015-08-27 DIAGNOSIS — L89329 Pressure ulcer of left buttock, unspecified stage: Secondary | ICD-10-CM | POA: Insufficient documentation

## 2015-08-27 DIAGNOSIS — M1990 Unspecified osteoarthritis, unspecified site: Secondary | ICD-10-CM | POA: Diagnosis not present

## 2015-08-27 DIAGNOSIS — N189 Chronic kidney disease, unspecified: Secondary | ICD-10-CM | POA: Insufficient documentation

## 2015-08-27 DIAGNOSIS — Z87891 Personal history of nicotine dependence: Secondary | ICD-10-CM | POA: Insufficient documentation

## 2015-08-27 DIAGNOSIS — E1140 Type 2 diabetes mellitus with diabetic neuropathy, unspecified: Secondary | ICD-10-CM | POA: Insufficient documentation

## 2015-08-27 DIAGNOSIS — I10 Essential (primary) hypertension: Secondary | ICD-10-CM | POA: Insufficient documentation

## 2015-08-27 DIAGNOSIS — Z7901 Long term (current) use of anticoagulants: Secondary | ICD-10-CM | POA: Insufficient documentation

## 2015-08-27 DIAGNOSIS — I129 Hypertensive chronic kidney disease with stage 1 through stage 4 chronic kidney disease, or unspecified chronic kidney disease: Secondary | ICD-10-CM | POA: Diagnosis not present

## 2015-08-27 DIAGNOSIS — E1122 Type 2 diabetes mellitus with diabetic chronic kidney disease: Secondary | ICD-10-CM | POA: Diagnosis not present

## 2015-08-27 NOTE — Telephone Encounter (Signed)
Forwarded to Dr. Lowne-Chase. JG//CMA 

## 2015-08-29 ENCOUNTER — Telehealth: Payer: Self-pay | Admitting: Family Medicine

## 2015-08-29 NOTE — Telephone Encounter (Signed)
Shelia Woods called back in to follow up on previous message sent.   CB: [phone]

## 2015-08-29 NOTE — Telephone Encounter (Signed)
Caller name: Janett Billow  Relation to pt: RN from [facility] Call back number: [phone]  Pharmacy:  Reason for call:  Nurse requesting verbal orders regarding bumetanide (BUMEX) 1 MG tablet would like to know if patient can take an addition pill due to  lef leg

## 2015-08-30 ENCOUNTER — Other Ambulatory Visit: Payer: Self-pay | Admitting: Family Medicine

## 2015-08-30 NOTE — Telephone Encounter (Signed)
Maybe ok but I need more details--- leg ?swelling, reddness, pain---- do we need to see her to r/o clot or does she need to go to UC/ ER?

## 2015-08-30 NOTE — Telephone Encounter (Signed)
Caller name: Janett Billow  Relation to pt: RN from [facility]  Call back number: [phone]    Nurse calling again to check on status of bumex stating this is now the 3rd call. Advised we are waiting on provider response.

## 2015-08-30 NOTE — Telephone Encounter (Signed)
Spoke with Janett Billow and she stated the patient is not having any symptoms, she said it was just swelling, she wanted to know if the patient can take an additional Bumex for the swelling. Per Dr.Lowne, it os ok to take over the weekend an call on Monday with the progress of the swelling, Janett Billow verbalized understanding and said she would the patient aware.    KP

## 2015-08-30 NOTE — Telephone Encounter (Signed)
Please advise      KP 

## 2015-09-03 DIAGNOSIS — L89329 Pressure ulcer of left buttock, unspecified stage: Secondary | ICD-10-CM | POA: Diagnosis not present

## 2015-09-06 NOTE — Telephone Encounter (Signed)
Ok to inc fluid pill--   We will need to check bmp

## 2015-09-06 NOTE — Telephone Encounter (Signed)
Caller name: Janett Billow RN with [facility] Can be reached: [phone]   Reason for call: Pt took extra fluid pill for several days and saw improvement and has now been back to normal dose and the swelling has gotten worse. Today Janett Billow noticed 2+ pitting edema lower left extremity. Pt is wanting to take extra fluid pill again. Please call Janett Billow to advise.

## 2015-09-09 ENCOUNTER — Telehealth: Payer: Self-pay | Admitting: Family Medicine

## 2015-09-09 NOTE — Telephone Encounter (Signed)
Caller name:jessica Relationship to patient:[facility] Can be [phone] Pharmacy:  Reason for call:Returning call to [facility]

## 2015-09-09 NOTE — Telephone Encounter (Signed)
Called and [facility] @ 8:55am @ [phone]) informing Janett Billow of the message below.  But asked Janett Billow to RTC regarding the message below.//AB/CMA

## 2015-09-10 NOTE — Telephone Encounter (Signed)
Left detailed message for Shelia Woods and request for return call.

## 2015-09-10 NOTE — Telephone Encounter (Signed)
It actually may be better for her to call her cardiologist

## 2015-09-10 NOTE — Telephone Encounter (Signed)
Spoke with RN, Janett Billow. She states pt has been taking Bumex 1mg  twice a day for "some time" and edema has not improved. Per verbal from Dr Etter Sjogren, if edema not improved with twice daily dosing pt should be evaluated in the office. Janett Billow will contact pt and advise her to call the office for an appt.

## 2015-09-10 NOTE — Telephone Encounter (Signed)
See 5/4 phone note.

## 2015-09-11 NOTE — Telephone Encounter (Signed)
Shelia Woods w/ [facility] informed to have pt schedule appt w/ cardiology d/t LEE.

## 2015-09-12 ENCOUNTER — Other Ambulatory Visit (HOSPITAL_COMMUNITY): Payer: Self-pay | Admitting: Internal Medicine

## 2015-09-17 DIAGNOSIS — L89329 Pressure ulcer of left buttock, unspecified stage: Secondary | ICD-10-CM | POA: Diagnosis not present

## 2015-09-25 ENCOUNTER — Telehealth: Payer: Self-pay | Admitting: Family Medicine

## 2015-09-25 NOTE — Telephone Encounter (Signed)
Returned call to Shelia Woods [phone]). BP is 168/65, HR 60, pt asymptomatic. Has taken all BP medication as prescribed. Please advise if any changes needed.

## 2015-09-25 NOTE — Telephone Encounter (Signed)
Caller name: Joseph Art Relation to pt: [facility]  Call back number: [phone]   Reason for call:  Wanted to inform patient  BP 168/165

## 2015-09-26 NOTE — Telephone Encounter (Signed)
Please recheck tomorrow-- im ok with this bp if she is asymptomatic because of her age

## 2015-09-27 ENCOUNTER — Other Ambulatory Visit: Payer: Self-pay | Admitting: Family Medicine

## 2015-09-27 NOTE — Telephone Encounter (Signed)
Called [facility] and spoke w/ Lexine Baton and made her aware of below. Pt has an appt today. They will recheck BP and let us know the reading.

## 2015-09-27 NOTE — Telephone Encounter (Signed)
Please advise      KP 

## 2015-09-27 NOTE — Telephone Encounter (Signed)
Followed up w/ [facility] as we have not received today's BP readings. Per their office, today's evaluation is still scheduled but has not been completed yet.

## 2015-09-27 NOTE — Telephone Encounter (Signed)
Caller name: Gae Bon  Relation to pt: RN from [facility]  Call back number: [phone]  Pharmacy: [id number] - [place], [address] AT [address] [phone] (Phone) [phone] (Fax)         Reason for call:  Nurse states patient is requesting lidocaine Rx for patient due to wounds and in between dressings patient states she's in pain.

## 2015-09-30 ENCOUNTER — Telehealth: Payer: Self-pay | Admitting: Family Medicine

## 2015-09-30 ENCOUNTER — Other Ambulatory Visit: Payer: Self-pay | Admitting: Family Medicine

## 2015-09-30 MED ORDER — LIDOCAINE 5 % EX PTCH: 1.0000 | MEDICATED_PATCH | CUTANEOUS | Status: DC

## 2015-09-30 MED ORDER — ATORVASTATIN CALCIUM 40 MG PO TABS: 40.0000 mg | ORAL_TABLET | Freq: Every day | ORAL | Status: DC

## 2015-09-30 NOTE — Telephone Encounter (Signed)
BP on 09/27/15 was 160/78, no symptoms noted at that time.

## 2015-09-30 NOTE — Telephone Encounter (Signed)
Is the 5 % patch appropriate, or should there be a lower dose?

## 2015-09-30 NOTE — Telephone Encounter (Signed)
Home health said she needed the wound center---  What they were doing was not helping Please -- call wound ceneter -- let them know she is trying to get out of coming-- maybe they can send orders to home health

## 2015-09-30 NOTE — Telephone Encounter (Signed)
Relationship to patient: self Can be reached: [phone]  Reason for call: pt is no longer going to the wound center, she states she needs [facility] to come to home for wound care.

## 2015-09-30 NOTE — Telephone Encounter (Signed)
Lidocaine patch 1 patch x 12h / day  #30

## 2015-09-30 NOTE — Telephone Encounter (Signed)
Patient said she is still having some oozing and pain and would like to keep having [facility] come out. She said she is paying $300 each time she goes to the wound center and she has gone 5 times already. Please advise    KP

## 2015-10-01 ENCOUNTER — Encounter (HOSPITAL_BASED_OUTPATIENT_CLINIC_OR_DEPARTMENT_OTHER): Payer: Medicare Other

## 2015-10-01 NOTE — Telephone Encounter (Signed)
The patient said the are is healed, but she is still having some weeping but can not afford to go to the Wound center, she said she is not going back. She said she will need to have the wound care at home so it won't get bad again.    KP

## 2015-10-02 ENCOUNTER — Telehealth: Payer: Self-pay | Admitting: Family Medicine

## 2015-10-02 NOTE — Telephone Encounter (Signed)
Can we check and see if the wound center is willing to write orders for home health since it is difficult for them to get there.

## 2015-10-02 NOTE — Telephone Encounter (Signed)
Caller name: Janett Billow  Relation to pt: RN [facility]  Call back number: [phone] Pharmacy:  Reason for call:  Wanted to inform PCP patient can't afford going to wound clinic and wanted to know will PCP take care of patient wounds. Please advise

## 2015-10-03 NOTE — Telephone Encounter (Signed)
Madison Hickman, RN from [facility], and left a message for call back.

## 2015-10-03 NOTE — Telephone Encounter (Signed)
Ok to keep meds the same for now

## 2015-10-03 NOTE — Telephone Encounter (Signed)
We referred her to wound clinic because what we ordered for home health was not working----we will con't it but the referral was done because it was not working.

## 2015-10-03 NOTE — Telephone Encounter (Signed)
Janett Billow called back.  Provider's note discussed.  Per Janett Billow, wound is improved.  It's red, but not open.  She says patient was told at her last appt at the wound clinic that she "probably only needed one more visit and then could come back intermittently."  Janett Billow says if it's okay with Dr. Etter Sjogren, they will continue wound care as previously ordered and then call back for further orders if wound opens back up.  Verbal order given to Janett Billow to restart previous orders.  A copy of the order will be faxed. Janett Billow says she will notify patient and make her aware of plan.

## 2015-10-03 NOTE — Telephone Encounter (Signed)
Duplicate note opened.    KP

## 2015-10-03 NOTE — Telephone Encounter (Signed)
To MD.    KP 

## 2015-10-12 ENCOUNTER — Other Ambulatory Visit (HOSPITAL_COMMUNITY): Payer: Self-pay | Admitting: Internal Medicine

## 2015-10-12 ENCOUNTER — Other Ambulatory Visit: Payer: Self-pay | Admitting: Family Medicine

## 2015-10-18 ENCOUNTER — Ambulatory Visit (HOSPITAL_COMMUNITY)
Admission: RE | Admit: 2015-10-18 | Discharge: 2015-10-18 | Disposition: A | Discharge status: Home / Self Care | Payer: Medicare Other | Source: Ambulatory Visit | Attending: Cardiology | Admitting: Cardiology

## 2015-10-18 VITALS — BP 232/64 | HR 85 | Wt 189.4 lb

## 2015-10-18 DIAGNOSIS — Z820 Family history of epilepsy and other diseases of the nervous system: Secondary | ICD-10-CM | POA: Diagnosis not present

## 2015-10-18 DIAGNOSIS — Z87891 Personal history of nicotine dependence: Secondary | ICD-10-CM | POA: Insufficient documentation

## 2015-10-18 DIAGNOSIS — Z833 Family history of diabetes mellitus: Secondary | ICD-10-CM | POA: Insufficient documentation

## 2015-10-18 DIAGNOSIS — E785 Hyperlipidemia, unspecified: Secondary | ICD-10-CM | POA: Diagnosis not present

## 2015-10-18 DIAGNOSIS — Z7901 Long term (current) use of anticoagulants: Secondary | ICD-10-CM | POA: Insufficient documentation

## 2015-10-18 DIAGNOSIS — N183 Chronic kidney disease, stage 3 unspecified: Secondary | ICD-10-CM

## 2015-10-18 DIAGNOSIS — I2510 Atherosclerotic heart disease of native coronary artery without angina pectoris: Secondary | ICD-10-CM | POA: Insufficient documentation

## 2015-10-18 DIAGNOSIS — I480 Paroxysmal atrial fibrillation: Secondary | ICD-10-CM | POA: Diagnosis not present

## 2015-10-18 DIAGNOSIS — E1122 Type 2 diabetes mellitus with diabetic chronic kidney disease: Secondary | ICD-10-CM | POA: Insufficient documentation

## 2015-10-18 DIAGNOSIS — Z794 Long term (current) use of insulin: Secondary | ICD-10-CM | POA: Insufficient documentation

## 2015-10-18 DIAGNOSIS — E669 Obesity, unspecified: Secondary | ICD-10-CM | POA: Diagnosis not present

## 2015-10-18 DIAGNOSIS — I5032 Chronic diastolic (congestive) heart failure: Secondary | ICD-10-CM | POA: Diagnosis present

## 2015-10-18 DIAGNOSIS — Z6830 Body mass index (BMI) 30.0-30.9, adult: Secondary | ICD-10-CM | POA: Insufficient documentation

## 2015-10-18 DIAGNOSIS — I130 Hypertensive heart and chronic kidney disease with heart failure and stage 1 through stage 4 chronic kidney disease, or unspecified chronic kidney disease: Secondary | ICD-10-CM | POA: Diagnosis not present

## 2015-10-18 DIAGNOSIS — M545 Low back pain: Secondary | ICD-10-CM | POA: Diagnosis not present

## 2015-10-18 DIAGNOSIS — I10 Essential (primary) hypertension: Secondary | ICD-10-CM

## 2015-10-18 DIAGNOSIS — I440 Atrioventricular block, first degree: Secondary | ICD-10-CM | POA: Diagnosis not present

## 2015-10-18 LAB — BASIC METABOLIC PANEL
ANION GAP: 7 (ref 5–15)
BUN: 40 mg/dL — ABNORMAL HIGH (ref 6–20)
CALCIUM: 10 mg/dL (ref 8.9–10.3)
CO2: 28 mmol/L (ref 22–32)
CREATININE: 1.95 mg/dL — AB (ref 0.44–1.00)
Chloride: 102 mmol/L (ref 101–111)
GFR, EST AFRICAN AMERICAN: 26 mL/min — AB (ref >60)
GFR, EST NON AFRICAN AMERICAN: 23 mL/min — AB (ref >60)
GLUCOSE: 204 mg/dL — AB (ref 65–99)
Potassium: 3.7 mmol/L (ref 3.5–5.1)
Sodium: 137 mmol/L (ref 135–145)

## 2015-10-18 LAB — CBC
HCT: 32.7 % — ABNORMAL LOW (ref 36.0–46.0)
Hemoglobin: 10.4 g/dL — ABNORMAL LOW (ref 12.0–15.0)
MCH: 27.1 pg (ref 26.0–34.0)
MCHC: 31.8 g/dL (ref 30.0–36.0)
MCV: 85.2 fL (ref 78.0–100.0)
PLATELETS: 395 10*3/uL (ref 150–400)
RBC: 3.84 MIL/uL — ABNORMAL LOW (ref 3.87–5.11)
RDW: 15 % (ref 11.5–15.5)
WBC: 10 10*3/uL (ref 4.0–10.5)

## 2015-10-18 LAB — BRAIN NATRIURETIC PEPTIDE: B NATRIURETIC PEPTIDE 5: 54.2 pg/mL (ref 0.0–100.0)

## 2015-10-18 MED ORDER — DILTIAZEM HCL ER BEADS 360 MG PO CP24: 360.0000 mg | ORAL_CAPSULE | Freq: Every day | ORAL | Status: DC

## 2015-10-18 MED ORDER — BUMETANIDE 1 MG PO TABS: ORAL_TABLET | ORAL | Status: DC

## 2015-10-18 MED ORDER — HYDRALAZINE HCL 100 MG PO TABS: 100.0000 mg | ORAL_TABLET | Freq: Three times a day (TID) | ORAL | Status: DC

## 2015-10-18 NOTE — Patient Instructions (Addendum)
INCREASE Ciltiazem to 360 mg tablet once daiyl.  INCREASE Hydralazine to 100 mg three times daily.  INCREASE Bumex to 1.5 mg (1.5 tablets) in am and 1 mg (1 tablet) in pm.  Routine lab work today. Will notify you of abnormal results, otherwise no news is good news!  Return Friday July 7th for labs. PLEASE BRING BP READINGS TO THIS APPOINTMENT.  Follow up 2 months with Dr. Aundra Dubin.  Do the following things EVERYDAY: 1) Weigh yourself in the morning before breakfast. Write it down and keep it in a log. 2) Take your medicines as prescribed 3) Eat low salt foods-Limit salt (sodium) to 2000 mg per day.  4) Stay as active as you can everyday 5) Limit all fluids for the day to less than 2 liters

## 2015-10-18 NOTE — Progress Notes (Signed)
Advanced Heart Failure Medication Review by a Pharmacist  Does the patient  feel that his/her medications are working for him/her?  yes  Has the patient been experiencing any side effects to the medications prescribed?  no  Does the patient measure his/her own blood pressure or blood glucose at home?  Not assessed    Does the patient have any problems obtaining medications due to transportation or finances?   no  Understanding of regimen: good Understanding of indications: good Potential of compliance: good Patient understands to avoid NSAIDs. Patient understands to avoid decongestants.  Pharmacist comments: Shelia Woods is a pleasant 44 yof presenting to clinic for a follow-up appointment. She denies any medication-related side effects at this time. She also denies any swelling, dizziness, or SOB. She has a printed medication list that is very detailed and has a good understanding of his medication regimen. She did not have any medication-related questions or concerns at this time.  Vineta Carone C. Lennox Grumbles, PharmD Pharmacy Resident  Pager: [phone] 10/18/2015 11:31 AM   Time with patient: 10 min  Preparation and documentation time: 2 min  Total time: 12 min

## 2015-10-20 DIAGNOSIS — I5032 Chronic diastolic (congestive) heart failure: Secondary | ICD-10-CM | POA: Insufficient documentation

## 2015-10-20 NOTE — Progress Notes (Signed)
ADVANCED HF CLINIC NOTE  Patient ID: Shelia Woods, female   DOB: 12/07/1930, 82 y.o.   MRN: [id number] PCP: Dr. Birdie Riddle Cardiology: Dr. Aundra Dubin  Shelia Woods is an 82 y/o woman with h/o obesity, HTN, CKD stage 3-4, PAF, diabetes.    She last saw Dr. Stanford Breed in 2014. She had a nuclear stress test in 2007 that showed EF 69% and no ischemia. She has never has a cath. Renal dopplers in 2009 showed no RAS.   She does not ambulate much due to her severe back pain and uses a walker or wheelchair.   Has a h/o PAF. Event monitor in 10/16 showed 1 run of atrial fibrillation.  She is on apixaban 2.5 mg bid.   Sleeps in chair due to her back. No chest pain. She has had no more palpitations since last visit.  She is tolerating Eliquis without overt bleeding.  She remains very limited by back pain.  She walks with walker, can go about 50 feet at a time.  No significant dyspnea, main limitations are orthopedic.  Weight is down 34 lbs since last appointment.  She says that she has been trying to lose weight by dieting.  Despite the weight loss, BP has been running higher.  It is quite high here in the office today (recheck SBP down to 174), but SBP has been running 150s-170s on her home readings.  She has had increasing swelling in her ankles.   Labs (10/16): LDL 89, K 4.1, creatinine 1.71, hgb 11.3 Labs (2/17): K 3.7, creatinine 1.58, LDL 93, TGs 285  ECG: NSR, 1st degree AV block  PMH: 1. Obesity 2. HTN: Renal artery dopplers (2009) with no renal artery stenosis. 3. CKD III-IV 4. Type II diabetes 5. Atrial fibrillation: Paroxysmal.  Event monitor (10/16) showed average HR 53 with 1 brief run of atrial fibrillation (controlled rate).  6. Cardiolite (2007) with EF 69%, no ischemia.  7. Hyperlipidemia 8. Chronic diastolic CHF: Echo (60/10) with EF 60-65%, mild LVH.  Echo (11/16) with EF 60-65%, moderate LVH.  9. Low back pain: Severe and debilitating.   ROS: All systems negative except as listed in HPI,  PMH and Problem List.  SH:  Social History   Social History  . Marital Status: Married    Spouse Name: N/A  . Number of Children: 4  . Years of Education: N/A   Occupational History  .      retired/disabled (1994)   Social History Main Topics  . Smoking status: Former Smoker    Quit date: 04/27/1978  . Smokeless tobacco: Never Used  . Alcohol Use: No  . Drug Use: No  . Sexual Activity: Not on file   Other Topics Concern  . Not on file   Social History Narrative   Four children   Regular exercise-no    FH:  Family History  Problem Relation Age of Onset  . Diabetes Sister   . Parkinsonism Sister     Current Outpatient Prescriptions  Medication Sig Dispense Refill  . acetaminophen (TYLENOL) 500 MG tablet Take 1,000 mg by mouth 3 (three) times daily.     Marland Kitchen apixaban (ELIQUIS) 2.5 MG TABS tablet Take 1 tablet (2.5 mg total) by mouth 2 (two) times daily. 180 tablet 3  . atorvastatin (LIPITOR) 40 MG tablet Take 1 tablet (40 mg total) by mouth at bedtime. 90 tablet 1  . Blood Glucose Monitoring Suppl (ONE TOUCH ULTRA SYSTEM KIT) W/DEVICE KIT 1 kit by Does not apply  route once. E11.9 1 each 0  . bumetanide (BUMEX) 1 MG tablet Take 1.5 mg (1.5 tabs) in am and 1 mg (1 tab) in pm. 180 tablet 6  . calcium carbonate (CALCIUM 600) 600 MG TABS tablet Take 1 tablet (600 mg total) by mouth every evening. 180 tablet 1  . cloNIDine (CATAPRES) 0.3 MG tablet TAKE 1 TABLET BY MOUTH TWICE DAILY 180 tablet 1  . diltiazem (TIAZAC) 360 MG 24 hr capsule Take 1 capsule (360 mg total) by mouth daily. 30 capsule 6  . fenofibrate 160 MG tablet TAKE 1 TABLET(160 MG) BY MOUTH EVERY EVENING 90 tablet 1  . fluocinonide cream (LIDEX) 0.05 % APPLY EXTERNALLY TO THE AFFECTED AREA TWICE DAILY 120 g 0  . Glucosamine-Chondroitin (OSTEO BI-FLEX REGULAR STRENGTH) 250-200 MG TABS Take 1 tablet by mouth 2 (two) times daily.     . hydrALAZINE (APRESOLINE) 100 MG tablet Take 1 tablet (100 mg total) by mouth 3  (three) times daily. 90 tablet 6  . insulin NPH Human (HUMULIN N,NOVOLIN N) 100 UNIT/ML injection Inject 0.3 mLs (30 Units total) into the skin at bedtime. 3 vial 3  . insulin regular (NOVOLIN R,HUMULIN R) 100 units/mL injection 15 units in the morning, 15 units midday and 20 units in the evening 10 mL 2  . isosorbide mononitrate (IMDUR) 30 MG 24 hr tablet Take 1 tablet (30 mg total) by mouth daily. 90 tablet 3  . loratadine (CLARITIN) 10 MG tablet Take 10 mg by mouth daily as needed for allergies.     Marland Kitchen losartan-hydrochlorothiazide (HYZAAR) 100-12.5 MG tablet TAKE 1 TABLET BY MOUTH DAILY 90 tablet 1  . Melatonin 1 MG TABS Take 1 mg by mouth at bedtime.    . Multiple Vitamins-Minerals (PRESERVISION AREDS 2 PO) Take 1 capsule by mouth 2 (two) times daily.     . ONE TOUCH ULTRA TEST test strip TEST TWICE DAILY AS DIRECTED 100 each 0  . ONETOUCH DELICA LANCETS 23R MISC Use one lancet each time sugars are checked. Pt tests twice daily. 100 each 12  . polyethylene glycol (MIRALAX / GLYCOLAX) packet Take 17 g by mouth 2 (two) times daily as needed for mild constipation.    . traMADol (ULTRAM) 50 MG tablet Take 50 mg by mouth at bedtime as needed for moderate pain.     No current facility-administered medications for this encounter.    Filed Vitals:   10/18/15 1109  BP: 232/64  Pulse: 85  Weight: 189 lb 6.4 oz (85.911 kg)  SpO2: 94%    PHYSICAL EXAM:  General:  Elderly woman sitting in WC. No resp difficulty HEENT: normal Neck: supple. JVP 8 cm. Carotids 2+ bilaterally; no bruits. No lymphadenopathy or thryomegaly appreciated. Cor: PMI normal. Regular rate & rhythm. 1/6 SEM RUSB.  Lungs: clear Abdomen: soft, obese nontender, nondistended. No hepatosplenomegaly. No bruits or masses. Good bowel sounds. Extremities: no cyanosis, clubbing, rash.  1+ edema 1/3 up lower legs, L>R.   Neuro: alert & orientedx3, cranial nerves grossly intact. Moves all 4 extremities w/o difficulty. Affect  pleasant.  ASSESSMENT & PLAN: 1. Chest pain: No recent chest pain. She has multiple RFs for CAD, so it is certainly possible that her prior chest tightness was due to angina.  We had wanted her to have a Cardiolite in 2016 but she cancelled the test.  At this point, with no chest pain, I think we could hold off on the stress test.  Would need very high threshold for coronary angiography  given CKD.  - Continue Imdur 30 daily, no aspirin due to apixaban use, continue statin.  2. Atrial fibrillation: Paroxysmal.  Continue apixaban (reduced dose given age and creatinine).  BMET/CBC today.  3. Hyperlipidemia: Continue statin.  4. HTN: BP has been running high.  Initially very high in office today but better on recheck. - Increase diltiazem CD to 360 mg daily.  - Increase hydralazine to 100 mg tid. - She will be back in 2 wks for labs, will bring new BP readings with her at that time and we can adjust meds as needed.  5. Chronic diastolic CHF: Suspect mild volume overload.  NYHA class III but prominent orthopedic limitations.  - Increase bumetanide to 1.5 qam, 1 qpm.  BMET in 2 wks.   6. CKD: Stage III. Sees Dr Joelyn Oms.   Followup in 2 months.   Loralie Champagne 10/20/2015

## 2015-10-25 ENCOUNTER — Telehealth: Payer: Self-pay | Admitting: *Deleted

## 2015-10-25 DIAGNOSIS — Z794 Long term (current) use of insulin: Secondary | ICD-10-CM | POA: Diagnosis not present

## 2015-10-25 DIAGNOSIS — L89152 Pressure ulcer of sacral region, stage 2: Secondary | ICD-10-CM | POA: Diagnosis not present

## 2015-10-25 DIAGNOSIS — E1143 Type 2 diabetes mellitus with diabetic autonomic (poly)neuropathy: Secondary | ICD-10-CM | POA: Diagnosis not present

## 2015-10-25 NOTE — Telephone Encounter (Signed)
Forwarded to Dr. Lowne-Chase. JG//CMA 

## 2015-10-28 NOTE — Telephone Encounter (Signed)
Faxed to [facility] successfully. Sent for scanning. JG//CMA

## 2015-10-30 ENCOUNTER — Encounter: Payer: Self-pay | Admitting: Family Medicine

## 2015-10-30 ENCOUNTER — Telehealth (HOSPITAL_COMMUNITY): Payer: Self-pay

## 2015-10-30 ENCOUNTER — Telehealth: Payer: Self-pay

## 2015-10-30 NOTE — Telephone Encounter (Signed)
Angie RN with [facility] called to report patient not feeling well during her routine weekly visit. Nurse reports patient was tearful, seemed anxious, and described not being able to sleep overnight because she could hear and feel her heart beat throughout her entire body.  Patient denies SOB, CP, dizziness, swelling, diaphoresis, N/V, fever, or any other symptoms. Recently saw patient in CHF clinic and increased 3 medications to control SBP in 230s at the time. BP now 180/64, HR 60's, so vitals seem to be trending down. Attempted to call patient on all available lines in chart, no answer.  Did leave a VM for patient that these symptoms may be due to recent increases in 3 medications to reduce her BP, but to call us or go to ED if she gets CP, presyncopal, lightheaded, or any other concerning s/s.  Also advised to call us back if she would like to move her Monday lab apt to Friday and we can also do her vitals and look her over at that time. Angie [facility] [place] RN aware and voiced understanding of this plan as well and voices that this seems to be more anxiety in nature related to her side effects of increasing medications and her body adjusting accordingly.  Renee Pain

## 2015-10-30 NOTE — Telephone Encounter (Signed)
Spoke with patient and she has agreed to take the Clonidine, however she can not make an apt tomorrow because she does not have transportation. She has agreed to go to the ER is her symptoms return and I made her aware to call cardiology because Jinny Blossom has tried to contact her, she said she would call right away.    KP

## 2015-10-30 NOTE — Telephone Encounter (Signed)
Received a fax from [facility]; [name] at 1:04 pm the patient called and stated she was trying to reach [place]. URGENT.   I called the patient and she stated she was feeling bad this morning, she said the Nurse from [facility] was in her home for wound care and her BP was 180/60 and pulse was 80. The patient said she tried to call but she was unable to reach anyone, she also called Cardiology and left a VM and no one has called her back yet, at the time that she tried to call she was experiencing tachycardia, palpitations and pain in her buttocks, she took Hydralazine @7  am Diltiazem @9  and Bumex, Losartan and Clonidine a little later and now feels better. She is no longer having the palpitations, no SOB, no CP however her BP is still elevated at 193/78 and pulse 79 ar 2:51pm, patient did not want to go to the ED but is request Medical advise, I advised I would send to the covering provider as well as Cariology. Please advise      KP

## 2015-10-30 NOTE — Telephone Encounter (Signed)
I would advise her to take her evening dose of clonidine now, arrange follow up appointment at our office tomorrow AM.  If worsening/recurrent symptoms overnight needs to go to the ER.

## 2015-10-30 NOTE — Telephone Encounter (Signed)
error:[id number] ° °

## 2015-11-04 ENCOUNTER — Ambulatory Visit (HOSPITAL_COMMUNITY)
Admission: RE | Admit: 2015-11-04 | Discharge: 2015-11-04 | Disposition: A | Discharge status: Home / Self Care | Payer: Medicare Other | Source: Ambulatory Visit | Attending: Cardiology | Admitting: Cardiology

## 2015-11-04 VITALS — BP 158/50 | HR 80 | Wt 224.4 lb

## 2015-11-04 DIAGNOSIS — I5023 Acute on chronic systolic (congestive) heart failure: Secondary | ICD-10-CM

## 2015-11-04 DIAGNOSIS — I480 Paroxysmal atrial fibrillation: Secondary | ICD-10-CM | POA: Diagnosis not present

## 2015-11-04 DIAGNOSIS — I5032 Chronic diastolic (congestive) heart failure: Secondary | ICD-10-CM | POA: Insufficient documentation

## 2015-11-04 DIAGNOSIS — I10 Essential (primary) hypertension: Secondary | ICD-10-CM

## 2015-11-04 LAB — BASIC METABOLIC PANEL
Anion gap: 8 (ref 5–15)
BUN: 40 mg/dL — AB (ref 6–20)
CHLORIDE: 101 mmol/L (ref 101–111)
CO2: 27 mmol/L (ref 22–32)
CREATININE: 1.97 mg/dL — AB (ref 0.44–1.00)
Calcium: 10.1 mg/dL (ref 8.9–10.3)
GFR calc non Af Amer: 22 mL/min — ABNORMAL LOW (ref >60)
GFR, EST AFRICAN AMERICAN: 26 mL/min — AB (ref >60)
Glucose, Bld: 268 mg/dL — ABNORMAL HIGH (ref 65–99)
POTASSIUM: 3.9 mmol/L (ref 3.5–5.1)
Sodium: 136 mmol/L (ref 135–145)

## 2015-11-04 MED ORDER — CHLORTHALIDONE 25 MG PO TABS: 25.0000 mg | ORAL_TABLET | Freq: Every day | ORAL | Status: DC

## 2015-11-04 MED ORDER — LOSARTAN POTASSIUM 100 MG PO TABS: 100.0000 mg | ORAL_TABLET | Freq: Every day | ORAL | Status: DC

## 2015-11-04 NOTE — Patient Instructions (Addendum)
STOP HYZAAR START Losartan 100 mg ,one tab daily START Chlorthalidone 25 mg, one tab daily  Labs needed in one week Your physician recommends that you schedule a follow-up appointment in: 3 weeks

## 2015-11-04 NOTE — Progress Notes (Signed)
Patient presented to office today for b/p check and lab work Patient did bring b/p log from home, SBP readings 160-180 consistently since last OV Patient did make medication changes and has not missed any doses, she did take AM medications today DENIES: chest pain, dizziness, HA, or increased SOB ADMITS: to "afib" event on 10/30/15- states she could hear her heart pounding and has since resolved, mild LE edema  B/p recheck @ 1250   158-50 EKG-NSR and reviewed by Rebecca Eaton  Per [place] as ordered to day (BMET) and again in one week Stop hyzaar Start losartan 100 mg, one tab daily Start chlorthalidone 25 mg, one tab daily Advised to continue to wear compression stocking, may further evaluate at OV in 3 weeks  Follow up in 3 weeks

## 2015-11-06 ENCOUNTER — Telehealth: Payer: Self-pay | Admitting: Family Medicine

## 2015-11-06 ENCOUNTER — Telehealth (HOSPITAL_COMMUNITY): Payer: Self-pay | Admitting: *Deleted

## 2015-11-06 NOTE — Telephone Encounter (Signed)
Called to follow up with patient.  Pt states she's doing okay.  Other than feeling a little tired and having chronic swelling in her left ankle/foot, she's fine.  She denies complaints of chest pain, heart palpitation, shortness of breath, weakness on one side, headaches, blurred vision, slurred speech, dizziness or confusion.  She says her blood pressure typically runs high in the morning.  She says she saw her cardiologist on Monday and blood pressure was elevated, so he made changes to her medication.  She was able to name the medications that have been stopped and added.  Per patient she is taking medications as prescribed.  Nurse Angie checked medications today during her home visit.  She also says that Angie notified her cardiologist, but she has not heard back from them yet.  Pt was advised to recheck blood pressure while we were on the phone.  Pt states blood pressure cuff is two rooms away, so she says she will go to the room rest for a moment, check blood pressure and call back with reading.  Awaiting call back from patient.  Called Angie, Home Health Nurse at [facility], to follow up with her.  Was unable to reach her.  Left a message for call back.  Awaiting call back from nurse.

## 2015-11-06 NOTE — Telephone Encounter (Signed)
Please arrange a follow up appointment with PCP in the next 1 week.

## 2015-11-06 NOTE — Telephone Encounter (Signed)
[place] Nurse - [facility] [phone]   She called in to update pcp that pt was seen by cardiologist and they changed a few of pt's medications. She says that pt started taking new medications on Tuesday and her BP was 200/68. She says that she has notified pt's cardiologist office as well.

## 2015-11-06 NOTE — Telephone Encounter (Signed)
Angel left a VM that pt's BP was elevated at 200/68 today and pt has started new meds yesterday as advised by our office on Mon 7/10.  Per Oda Kilts, PA make sure pt has started has started meds as ordered, continue to monitor BP and let us if still elevated can increase meds.  Attempted to call Glenard Haring back and got her VM, left mess with above recommendations.  Also see in chart she had contacted pt's pcp regarding BP as well.

## 2015-11-06 NOTE — Telephone Encounter (Signed)
Angie called back to report that labs will be drawn next week and that patient is being followed closely by cardiology.  She also added that medications had been changed on Monday, but patient only started taking the medications yesterday and therefore has only had 2 doses, and that they may not have had time to get into her system yet. She did confirm as well that patient's blood pressure typically runs high in the morning and that morning medications are typically taken late morning (possibly around 10 or 11 am).   Per Angie, pt reports that she on a low sugar, low sodium and low fat diet.  While she has wounds that [facility] is treating and chronic back pain, pt does not really complain of pain.  Angie is unsure of reason for increase in blood pressure, but called to update PCP.     Called patient back.  Unable to reach her.  Left a message for call back.

## 2015-11-06 NOTE — Telephone Encounter (Signed)
Pt called back.  BP currently 177/80, HR: 65.  Pt was advised to continue to monitor blood pressure and to call us back if blood pressure continues to increase or  she begins to experience chest pain, shortness of breath, weakness, blurred vision, dizziness, etc.  She stated understanding and agreed to comply.

## 2015-11-06 NOTE — Telephone Encounter (Signed)
I understand her problem with transportation--- can [facility] help-?  However if she is seeing Cardiology they will be able to take care of bp problem--- please let cardio know

## 2015-11-06 NOTE — Telephone Encounter (Signed)
Called and informed the pt of the note below.  Pt verbalized understanding.  She stated that her biggest issue is transportation.  She said her children don't live close by and she has to try to get transportation.  She stated that she will be getting blood work on Mon with the [facility] nurse, and then she will be seeing Dr. Aundra Dubin (Cardiology) on (11/26/15).   Pt stated that she will try to make that appointment with Dr. Lowne.//AB/CMA

## 2015-11-07 ENCOUNTER — Other Ambulatory Visit (HOSPITAL_COMMUNITY): Payer: Self-pay | Admitting: Internal Medicine

## 2015-11-07 NOTE — Telephone Encounter (Signed)
Cardiology is aware.

## 2015-11-11 ENCOUNTER — Telehealth (HOSPITAL_COMMUNITY): Payer: Self-pay | Admitting: *Deleted

## 2015-11-11 MED ORDER — AMLODIPINE BESYLATE 5 MG PO TABS: 5.0000 mg | ORAL_TABLET | Freq: Every day | ORAL | Status: DC

## 2015-11-11 NOTE — Addendum Note (Signed)
Addended by: Scarlette Calico on: 11/11/2015 04:56 PM   Modules accepted: Orders

## 2015-11-11 NOTE — Telephone Encounter (Signed)
Entered in error

## 2015-11-11 NOTE — Telephone Encounter (Signed)
Shelia Woods with [facility] requests call back from [facility] about pts blood pressure. Patient is taking all medications as prescribed and her bp is still 182/60. Please advise

## 2015-11-11 NOTE — Telephone Encounter (Addendum)
Discussed w/Andy Chalmers Cater, PA he recommends pt start Amlodipine 5 mg daily, spoke w/pt, she is aware and agreeable, rx sent in.  Attempted to call [facility] and left VM for her with new med info

## 2015-11-13 ENCOUNTER — Telehealth: Payer: Self-pay | Admitting: Family Medicine

## 2015-11-13 NOTE — Telephone Encounter (Signed)
Cardiology is adjusting bp meds-- norvasc just started [facility] to order wound care

## 2015-11-13 NOTE — Telephone Encounter (Signed)
Returned call to [place] and left message to return call.

## 2015-11-13 NOTE — Telephone Encounter (Signed)
Caller name: Janett Billow Relationship to patient: [facility] Can be reached: [phone]   Reason for call: BP reading yesterday was 180/64.  Also needs order for wound care 2 times per week to clean and apply dressing

## 2015-11-13 NOTE — Telephone Encounter (Signed)
Verbal orders given to [facility] as directed. Pt had not started norvasc at time of last BP check. She was supposed to pick it up yesterday and start. No further questions or concerns at time of call.

## 2015-11-14 ENCOUNTER — Telehealth: Payer: Self-pay

## 2015-11-14 MED ORDER — TRAMADOL HCL 50 MG PO TABS: 25.0000 mg | ORAL_TABLET | Freq: Four times a day (QID) | ORAL | Status: DC | PRN

## 2015-11-14 NOTE — Telephone Encounter (Signed)
Refill x1 

## 2015-11-14 NOTE — Telephone Encounter (Signed)
Pt is requesting is Tramadol 50 mg.    Last OV: 06/20/2015 Last Fill: 08/23/2015 #60 and 0RF UDS: None   Please advise.

## 2015-11-14 NOTE — Telephone Encounter (Signed)
Rx printed, awaiting MD signature.  

## 2015-11-15 ENCOUNTER — Telehealth: Payer: Self-pay

## 2015-11-15 NOTE — Telephone Encounter (Signed)
Rx faxed to [facility] pharmacy.  

## 2015-11-15 NOTE — Telephone Encounter (Signed)
Received wound care orders from [facility] via fax.  Orders reviewed and signed by provider.  Orders faxed and fax confirmation received.  Second signature provided and placed in scan bin for scanning.

## 2015-11-25 ENCOUNTER — Other Ambulatory Visit (HOSPITAL_COMMUNITY): Payer: Self-pay | Admitting: Internal Medicine

## 2015-11-26 ENCOUNTER — Ambulatory Visit (HOSPITAL_COMMUNITY)
Admission: RE | Admit: 2015-11-26 | Discharge: 2015-11-26 | Disposition: A | Discharge status: Home / Self Care | Payer: Medicare Other | Source: Ambulatory Visit | Attending: Cardiology | Admitting: Cardiology

## 2015-11-26 VITALS — BP 202/0 | HR 84 | Wt 220.4 lb

## 2015-11-26 DIAGNOSIS — Z87891 Personal history of nicotine dependence: Secondary | ICD-10-CM | POA: Insufficient documentation

## 2015-11-26 DIAGNOSIS — Z79899 Other long term (current) drug therapy: Secondary | ICD-10-CM | POA: Insufficient documentation

## 2015-11-26 DIAGNOSIS — Z833 Family history of diabetes mellitus: Secondary | ICD-10-CM | POA: Insufficient documentation

## 2015-11-26 DIAGNOSIS — R079 Chest pain, unspecified: Secondary | ICD-10-CM | POA: Diagnosis present

## 2015-11-26 DIAGNOSIS — I480 Paroxysmal atrial fibrillation: Secondary | ICD-10-CM | POA: Diagnosis not present

## 2015-11-26 DIAGNOSIS — Z6835 Body mass index (BMI) 35.0-35.9, adult: Secondary | ICD-10-CM | POA: Diagnosis not present

## 2015-11-26 DIAGNOSIS — I10 Essential (primary) hypertension: Secondary | ICD-10-CM | POA: Diagnosis not present

## 2015-11-26 DIAGNOSIS — E669 Obesity, unspecified: Secondary | ICD-10-CM | POA: Diagnosis not present

## 2015-11-26 DIAGNOSIS — Z794 Long term (current) use of insulin: Secondary | ICD-10-CM | POA: Diagnosis not present

## 2015-11-26 DIAGNOSIS — I5032 Chronic diastolic (congestive) heart failure: Secondary | ICD-10-CM | POA: Insufficient documentation

## 2015-11-26 DIAGNOSIS — E1122 Type 2 diabetes mellitus with diabetic chronic kidney disease: Secondary | ICD-10-CM | POA: Insufficient documentation

## 2015-11-26 DIAGNOSIS — M545 Low back pain: Secondary | ICD-10-CM | POA: Diagnosis not present

## 2015-11-26 DIAGNOSIS — Z7901 Long term (current) use of anticoagulants: Secondary | ICD-10-CM | POA: Diagnosis not present

## 2015-11-26 DIAGNOSIS — I130 Hypertensive heart and chronic kidney disease with heart failure and stage 1 through stage 4 chronic kidney disease, or unspecified chronic kidney disease: Secondary | ICD-10-CM | POA: Diagnosis not present

## 2015-11-26 DIAGNOSIS — E785 Hyperlipidemia, unspecified: Secondary | ICD-10-CM | POA: Diagnosis not present

## 2015-11-26 DIAGNOSIS — N183 Chronic kidney disease, stage 3 (moderate): Secondary | ICD-10-CM | POA: Insufficient documentation

## 2015-11-26 MED ORDER — AMLODIPINE BESYLATE 10 MG PO TABS: 10.0000 mg | ORAL_TABLET | Freq: Every day | ORAL | 6 refills | Status: DC

## 2015-11-26 MED ORDER — CLONIDINE HCL 0.3 MG PO TABS: 0.3000 mg | ORAL_TABLET | Freq: Three times a day (TID) | ORAL | 1 refills | Status: DC

## 2015-11-26 NOTE — Patient Instructions (Signed)
INCREASE Amlodipine to 10 mg, one tab daily at bedtime INCREASE CLONIDINE TO 0.3 MG , one tab three times per day  Your physician recommends that you schedule a follow-up appointment as scheduled with Dr Aundra Dubin

## 2015-11-26 NOTE — Progress Notes (Signed)
Advanced Heart Failure Medication Review by a Pharmacist  Does the patient  feel that his/her medications are working for him/her?  yes  Has the patient been experiencing any side effects to the medications prescribed?  no  Does the patient measure his/her own blood pressure or blood glucose at home?  yes   Does the patient have any problems obtaining medications due to transportation or finances?   no  Understanding of regimen: good Understanding of indications: good Potential of compliance: good Patient understands to avoid NSAIDs. Patient understands to avoid decongestants.  Issues to address at subsequent visits: None   Pharmacist comments:  [name] is a pleasant 82 yo F presenting with her husband and son and a current medication list. She reports great compliance with her regimen. She does state that her BP is usually high in the mornings (200s) and after taking all of her BP meds in the morning her BP usually drops to 140s and she starts to feel dizzy. Will need to change some of her antihypertensives to evening dosing.    Shelia Woods. Shelia Woods, PharmD, BCPS, CPP Clinical Pharmacist Pager: [phone] Phone: [phone] 11/26/2015 10:50 AM      Time with patient: 10 minutes Preparation and documentation time: 2 minutes Total time: 12 minutes

## 2015-11-26 NOTE — Progress Notes (Signed)
Patient ID: Shelia Woods, female   DOB: 06/20/1930, 82 y.o.   MRN: [id number]  ADVANCED HF CLINIC NOTE  Patient ID: Shelia Woods, female   DOB: Jan 02, 1931, 82 y.o.   MRN: [id number] PCP: Dr. Birdie Riddle Cardiology: Dr. Aundra Dubin  Shelia Woods is an 82 y/o woman with h/o obesity, HTN, CKD stage 3-4, PAF, diabetes.    She last saw Dr. Stanford Breed in 2014. She had a nuclear stress test in 2007 that showed EF 69% and no ischemia. She has never has a cath. Renal dopplers in 2009 showed no RAS.   She does not ambulate much due to her severe back pain and uses a walker or wheelchair.   Has a h/o PAF. Event monitor in 10/16 showed 1 run of atrial fibrillation.  She is on apixaban 2.5 mg bid.   She returns for follow up. SBP at home 140-190s but most often 150s. Says when she is in the 140s she feels dizzy  Overall feeling ok. Mild dyspnea with exertion. Able to walk 20-30 feet but essentially wheel chair bound. Limited by back pain. Followed by [facility] for HTN and wound care.   Labs (10/16): LDL 89, K 4.1, creatinine 1.71, hgb 11.3 Labs (2/17): K 3.7, creatinine 1.58, LDL 93, TGs 285 Labs (11/04/2015) K 3.9 Creatinine 1.97   PMH: 1. Obesity 2. HTN: Renal artery dopplers (2009) with no renal artery stenosis. 3. CKD III-IV 4. Type II diabetes 5. Atrial fibrillation: Paroxysmal.  Event monitor (10/16) showed average HR 53 with 1 brief run of atrial fibrillation (controlled rate).  6. Cardiolite (2007) with EF 69%, no ischemia.  7. Hyperlipidemia 8. Chronic diastolic CHF: Echo (75/10) with EF 60-65%, mild LVH.  Echo (11/16) with EF 60-65%, moderate LVH.  9. Low back pain: Severe and debilitating.   ROS: All systems negative except as listed in HPI, PMH and Problem List.  SH:  Social History   Social History  . Marital status: Married    Spouse name: N/A  . Number of children: 4  . Years of education: N/A   Occupational History  .  Retired    retired/disabled (1994)   Social History Main  Topics  . Smoking status: Former Smoker    Quit date: 04/27/1978  . Smokeless tobacco: Never Used  . Alcohol use No  . Drug use: No  . Sexual activity: Not on file   Other Topics Concern  . Not on file   Social History Narrative   Four children   Regular exercise-no    FH:  Family History  Problem Relation Age of Onset  . Diabetes Sister   . Parkinsonism Sister     Current Outpatient Prescriptions  Medication Sig Dispense Refill  . acetaminophen (TYLENOL) 500 MG tablet Take 1,000 mg by mouth 3 (three) times daily.     Marland Kitchen amLODipine (NORVASC) 5 MG tablet Take 1 tablet (5 mg total) by mouth daily. 30 tablet 3  . apixaban (ELIQUIS) 2.5 MG TABS tablet Take 1 tablet (2.5 mg total) by mouth 2 (two) times daily. 180 tablet 3  . atorvastatin (LIPITOR) 40 MG tablet Take 1 tablet (40 mg total) by mouth at bedtime. 90 tablet 1  . Blood Glucose Monitoring Suppl (ONE TOUCH ULTRA SYSTEM KIT) W/DEVICE KIT 1 kit by Does not apply route once. E11.9 1 each 0  . bumetanide (BUMEX) 1 MG tablet Take 1.5 mg (1.5 tabs) in am and 1 mg (1 tab) in pm. 180 tablet 6  .  calcium carbonate (CALCIUM 600) 600 MG TABS tablet Take 1 tablet (600 mg total) by mouth every evening. 180 tablet 1  . chlorthalidone (HYGROTON) 25 MG tablet Take 1 tablet (25 mg total) by mouth daily. 30 tablet 3  . cloNIDine (CATAPRES) 0.3 MG tablet TAKE 1 TABLET BY MOUTH TWICE DAILY 180 tablet 1  . diltiazem (TIAZAC) 360 MG 24 hr capsule Take 1 capsule (360 mg total) by mouth daily. 30 capsule 6  . fenofibrate 160 MG tablet TAKE 1 TABLET(160 MG) BY MOUTH EVERY EVENING 90 tablet 1  . fluocinonide cream (LIDEX) 0.05 % APPLY EXTERNALLY TO THE AFFECTED AREA TWICE DAILY 120 g 0  . Glucosamine-Chondroitin (OSTEO BI-FLEX REGULAR STRENGTH) 250-200 MG TABS Take 1 tablet by mouth 2 (two) times daily.     . hydrALAZINE (APRESOLINE) 100 MG tablet Take 1 tablet (100 mg total) by mouth 3 (three) times daily. 90 tablet 6  . insulin NPH Human (HUMULIN  N,NOVOLIN N) 100 UNIT/ML injection Inject 0.3 mLs (30 Units total) into the skin at bedtime. 3 vial 3  . insulin regular (NOVOLIN R,HUMULIN R) 100 units/mL injection 15 units in the morning, 15 units midday and 20 units in the evening 10 mL 2  . isosorbide mononitrate (IMDUR) 30 MG 24 hr tablet Take 1 tablet (30 mg total) by mouth daily. 90 tablet 3  . loratadine (CLARITIN) 10 MG tablet Take 10 mg by mouth daily as needed for allergies.     Marland Kitchen losartan (COZAAR) 100 MG tablet Take 1 tablet (100 mg total) by mouth daily. 30 tablet 3  . Melatonin 1 MG TABS Take 1 mg by mouth at bedtime.    . Multiple Vitamins-Minerals (PRESERVISION AREDS 2 PO) Take 1 capsule by mouth 2 (two) times daily.     . ONE TOUCH ULTRA TEST test strip TEST TWICE DAILY AS DIRECTED 100 each 0  . ONETOUCH DELICA LANCETS 68L MISC Use one lancet each time sugars are checked. Pt tests twice daily. 100 each 12  . polyethylene glycol (MIRALAX / GLYCOLAX) packet Take 17 g by mouth 2 (two) times daily as needed for mild constipation.    . traMADol (ULTRAM) 50 MG tablet Take 0.5-1 tablets (25-50 mg total) by mouth every 6 (six) hours as needed for moderate pain. 60 tablet 0   No current facility-administered medications for this encounter.   Dictation #1 [id number]  [id number]   Vitals:   11/26/15 1038  BP: (!) 202/0  Pulse: 84  SpO2: 95%  Weight: 220 lb 6 oz (100 kg)    PHYSICAL EXAM:  General:  Elderly woman sitting in WC. No resp difficulty. Hus HEENT: normal Neck: supple. JVP 5-6 cm. Carotids 2+ bilaterally; no bruits. No lymphadenopathy or thryomegaly appreciated. Cor: PMI normal. Regular rate & rhythm. 1/6 SEM RUSB.  Lungs: clear Abdomen: soft, obese nontender, nondistended. No hepatosplenomegaly. No bruits or masses. Good bowel sounds. Extremities: no cyanosis, clubbing, rash.  RLE and LLE trace-1+ edema  Neuro: alert & orientedx3, cranial nerves grossly intact. Moves all 4 extremities w/o difficulty. Affect  pleasant.  ASSESSMENT & PLAN: 1. Chest pain: Currently no CP.  We had wanted her to have a Cardiolite in 2016 but she cancelled the test.  Hold off on the stress test.  Would need very high threshold for coronary angiography given CKD.  - Continue Imdur 30 daily, no aspirin due to apixaban use, continue statin.  2. Atrial fibrillation: Paroxysmal.  Regular pulse. Continue apixaban (reduced dose given age and creatinine).  3. Hyperlipidemia: Continue statin.  4. HTN: BP at home most often > 150. Increase amlodipine to 10 mg daily Increase clonidine to 0.3 mg three times a day.     - Continue diltiazem CD to 360 mg daily.  - Continue hydralazine to 100 mg tid. - 5. Chronic diastolic CHF: NYHA class II-III. limitations. Volume status stable.  - Continue bumetanide to 1.5 qam and  1 qpm.     6. CKD: Stage III. Sees Dr Joelyn Oms.   Follow up in 3 weeks with Dr Aundra Dubin.    Amy Clegg NP-C  11/26/2015

## 2015-11-29 ENCOUNTER — Other Ambulatory Visit (HOSPITAL_COMMUNITY): Payer: Self-pay | Admitting: Internal Medicine

## 2015-12-03 ENCOUNTER — Encounter: Payer: Self-pay | Admitting: Internal Medicine

## 2015-12-15 ENCOUNTER — Other Ambulatory Visit: Payer: Self-pay | Admitting: Family Medicine

## 2015-12-16 ENCOUNTER — Ambulatory Visit (HOSPITAL_COMMUNITY)
Admission: RE | Admit: 2015-12-16 | Discharge: 2015-12-16 | Disposition: A | Discharge status: Home / Self Care | Payer: Medicare Other | Source: Ambulatory Visit | Attending: Cardiology | Admitting: Cardiology

## 2015-12-16 ENCOUNTER — Encounter (HOSPITAL_COMMUNITY): Payer: Self-pay

## 2015-12-16 VITALS — BP 153/47 | HR 76 | Wt 221.2 lb

## 2015-12-16 DIAGNOSIS — I5032 Chronic diastolic (congestive) heart failure: Secondary | ICD-10-CM | POA: Diagnosis not present

## 2015-12-16 DIAGNOSIS — E669 Obesity, unspecified: Secondary | ICD-10-CM | POA: Insufficient documentation

## 2015-12-16 DIAGNOSIS — E785 Hyperlipidemia, unspecified: Secondary | ICD-10-CM | POA: Insufficient documentation

## 2015-12-16 DIAGNOSIS — I10 Essential (primary) hypertension: Secondary | ICD-10-CM

## 2015-12-16 DIAGNOSIS — Z794 Long term (current) use of insulin: Secondary | ICD-10-CM | POA: Diagnosis not present

## 2015-12-16 DIAGNOSIS — I480 Paroxysmal atrial fibrillation: Secondary | ICD-10-CM | POA: Insufficient documentation

## 2015-12-16 DIAGNOSIS — Z87891 Personal history of nicotine dependence: Secondary | ICD-10-CM | POA: Diagnosis not present

## 2015-12-16 DIAGNOSIS — Z7901 Long term (current) use of anticoagulants: Secondary | ICD-10-CM | POA: Diagnosis not present

## 2015-12-16 DIAGNOSIS — I130 Hypertensive heart and chronic kidney disease with heart failure and stage 1 through stage 4 chronic kidney disease, or unspecified chronic kidney disease: Secondary | ICD-10-CM | POA: Diagnosis not present

## 2015-12-16 DIAGNOSIS — Z6835 Body mass index (BMI) 35.0-35.9, adult: Secondary | ICD-10-CM | POA: Diagnosis not present

## 2015-12-16 DIAGNOSIS — Z833 Family history of diabetes mellitus: Secondary | ICD-10-CM | POA: Diagnosis not present

## 2015-12-16 DIAGNOSIS — Z79899 Other long term (current) drug therapy: Secondary | ICD-10-CM | POA: Diagnosis not present

## 2015-12-16 DIAGNOSIS — E1122 Type 2 diabetes mellitus with diabetic chronic kidney disease: Secondary | ICD-10-CM | POA: Insufficient documentation

## 2015-12-16 DIAGNOSIS — N183 Chronic kidney disease, stage 3 unspecified: Secondary | ICD-10-CM

## 2015-12-16 LAB — BASIC METABOLIC PANEL
Anion gap: 12 (ref 5–15)
BUN: 55 mg/dL — ABNORMAL HIGH (ref 6–20)
CHLORIDE: 100 mmol/L — AB (ref 101–111)
CO2: 26 mmol/L (ref 22–32)
CREATININE: 2.32 mg/dL — AB (ref 0.44–1.00)
Calcium: 10.7 mg/dL — ABNORMAL HIGH (ref 8.9–10.3)
GFR, EST AFRICAN AMERICAN: 21 mL/min — AB (ref >60)
GFR, EST NON AFRICAN AMERICAN: 18 mL/min — AB (ref >60)
Glucose, Bld: 194 mg/dL — ABNORMAL HIGH (ref 65–99)
Potassium: 3.7 mmol/L (ref 3.5–5.1)
SODIUM: 138 mmol/L (ref 135–145)

## 2015-12-16 MED ORDER — BUMETANIDE 1 MG PO TABS: ORAL_TABLET | ORAL | 2 refills | Status: DC

## 2015-12-16 MED ORDER — NIFEDIPINE ER OSMOTIC RELEASE 60 MG PO TB24: 120.0000 mg | ORAL_TABLET | Freq: Every morning | ORAL | 1 refills | Status: DC

## 2015-12-16 MED ORDER — BISOPROLOL FUMARATE 5 MG PO TABS: 2.5000 mg | ORAL_TABLET | Freq: Every day | ORAL | 1 refills | Status: DC

## 2015-12-16 NOTE — Patient Instructions (Signed)
Stop Amlodipine  Stop Diltiazem  Start Nifedipine XL 120 mg (2 tabs) Every AM  Start Bisoprolol 2.5 mg (1/2 tab) Every evening  Labs today  Your physician has requested that you regularly monitor and record your blood pressure readings at home. Please use the same machine at the same time of day to check your readings and record them.  PLEASE CALL us IN ABOUT 2 WEEKS WITH READINGS.  We will contact you in 2 months to schedule your next appointment.

## 2015-12-16 NOTE — Progress Notes (Signed)
ADVANCED HF CLINIC NOTE  Patient ID: Shelia Woods, female   DOB: [birth year], 82 y.o.   MRN: [id number] PCP: Dr. Birdie Riddle Cardiology: Dr. Aundra Dubin  Shelia Woods is an 82 y/o woman with h/o obesity, HTN, CKD stage 3-4, PAF, diabetes.    She had a nuclear stress test in 2007 that showed EF 69% and no ischemia. She has never has a cath. Renal dopplers in 2009 showed no RAS.   She does not ambulate much due to her severe back pain and uses a walker or wheelchair.   Has a h/o PAF. Event monitor in 10/16 showed 1 run of atrial fibrillation.  She is on apixaban 2.5 mg bid.   Sleeps in chair due to her back. No chest pain.  Fairly rare episodes of palpitations, usually notes at night.  She is tolerating Eliquis without overt bleeding.  She remains very limited by back pain.  She walks with walker, can go about 50 feet at a time, will be short of breath after that.  Weight is stable.  BP has been running high, SBP 150s-170s at home.  She is taking both amlodipine and diltiazem CD.    Labs (10/16): LDL 89, K 4.1, creatinine 1.71, hgb 11.3 Labs (2/17): K 3.7, creatinine 1.58, LDL 93, TGs 285 Labs (8/17): K 4.4, creatinine 2.33  PMH: 1. Obesity 2. HTN: Renal artery dopplers (2009) with no renal artery stenosis. 3. CKD III-IV 4. Type II diabetes 5. Atrial fibrillation: Paroxysmal.  Event monitor (10/16) showed average HR 53 with 1 brief run of atrial fibrillation (controlled rate).  6. Cardiolite (2007) with EF 69%, no ischemia.  7. Hyperlipidemia 8. Chronic diastolic CHF: Echo (78/29) with EF 60-65%, mild LVH.  Echo (11/16) with EF 60-65%, moderate LVH.  9. Low back pain: Severe and debilitating.   ROS: All systems negative except as listed in HPI, PMH and Problem List.  SH:  Social History   Social History  . Marital status: Married    Spouse name: N/A  . Number of children: 4  . Years of education: N/A   Occupational History  .  Retired    retired/disabled (1994)   Social History Main  Topics  . Smoking status: Former Smoker    Quit date: 04/27/1978  . Smokeless tobacco: Never Used  . Alcohol use No  . Drug use: No  . Sexual activity: Not on file   Other Topics Concern  . Not on file   Social History Narrative   Four children   Regular exercise-no    FH:  Family History  Problem Relation Age of Onset  . Diabetes Sister   . Parkinsonism Sister     Current Outpatient Prescriptions  Medication Sig Dispense Refill  . acetaminophen (TYLENOL) 500 MG tablet Take 1,000 mg by mouth 3 (three) times daily.     Marland Kitchen apixaban (ELIQUIS) 2.5 MG TABS tablet Take 1 tablet (2.5 mg total) by mouth 2 (two) times daily. 180 tablet 3  . atorvastatin (LIPITOR) 40 MG tablet Take 1 tablet (40 mg total) by mouth at bedtime. 90 tablet 1  . Blood Glucose Monitoring Suppl (ONE TOUCH ULTRA SYSTEM KIT) W/DEVICE KIT 1 kit by Does not apply route once. E11.9 1 each 0  . bumetanide (BUMEX) 1 MG tablet Take 1.5 mg (1.5 tabs) in am and 1 mg (1 tab) in pm. 225 tablet 2  . calcium carbonate (CALCIUM 600) 600 MG TABS tablet Take 1 tablet (600 mg total) by mouth every evening.  180 tablet 1  . chlorthalidone (HYGROTON) 25 MG tablet Take 1 tablet (25 mg total) by mouth daily. 30 tablet 3  . cloNIDine (CATAPRES) 0.3 MG tablet Take 1 tablet (0.3 mg total) by mouth 3 (three) times daily. 270 tablet 1  . fenofibrate 160 MG tablet TAKE 1 TABLET(160 MG) BY MOUTH EVERY EVENING 90 tablet 1  . fluocinonide cream (LIDEX) 0.05 % APPLY EXTERNALLY TO THE AFFECTED AREA TWICE DAILY 120 g 0  . Glucosamine-Chondroitin (OSTEO BI-FLEX REGULAR STRENGTH) 250-200 MG TABS Take 1 tablet by mouth 2 (two) times daily.     . hydrALAZINE (APRESOLINE) 100 MG tablet Take 1 tablet (100 mg total) by mouth 3 (three) times daily. 90 tablet 6  . insulin NPH Human (HUMULIN N,NOVOLIN N) 100 UNIT/ML injection Inject 0.3 mLs (30 Units total) into the skin at bedtime. 3 vial 3  . insulin regular (NOVOLIN R,HUMULIN R) 100 units/mL injection 15  units in the morning, 15 units midday and 20 units in the evening 10 mL 2  . isosorbide mononitrate (IMDUR) 30 MG 24 hr tablet TAKE 1 TABLET BY MOUTH DAILY 90 tablet 3  . loratadine (CLARITIN) 10 MG tablet Take 10 mg by mouth daily as needed for allergies.     Marland Kitchen losartan (COZAAR) 100 MG tablet Take 1 tablet (100 mg total) by mouth daily. 30 tablet 3  . Melatonin 1 MG TABS Take 1 mg by mouth at bedtime.    . Multiple Vitamins-Minerals (PRESERVISION AREDS 2 PO) Take 1 capsule by mouth 2 (two) times daily.     . ONE TOUCH ULTRA TEST test strip TEST TWICE DAILY AS DIRECTED 100 each 0  . ONETOUCH DELICA LANCETS 28B MISC Use one lancet each time sugars are checked. Pt tests twice daily. 100 each 12  . ONETOUCH DELICA LANCETS FINE MISC USE TWICE DAILY 100 each 5  . polyethylene glycol (MIRALAX / GLYCOLAX) packet Take 17 g by mouth 2 (two) times daily as needed for mild constipation.    . traMADol (ULTRAM) 50 MG tablet Take 0.5-1 tablets (25-50 mg total) by mouth every 6 (six) hours as needed for moderate pain. 60 tablet 0  . bisoprolol (ZEBETA) 5 MG tablet Take 0.5 tablets (2.5 mg total) by mouth daily. 45 tablet 1  . NIFEdipine (PROCARDIA XL/ADALAT-CC) 60 MG 24 hr tablet Take 2 tablets (120 mg total) by mouth every morning. 180 tablet 1   No current facility-administered medications for this encounter.     Vitals:   12/16/15 1218  BP: (!) 153/47  Pulse: 76  SpO2: 98%  Weight: 221 lb 4 oz (100.4 kg)    PHYSICAL EXAM:  General:  Elderly woman sitting in WC. No resp difficulty HEENT: normal Neck: supple. JVP 7 cm. Carotids 2+ bilaterally; no bruits. No lymphadenopathy or thryomegaly appreciated. Cor: PMI normal. Regular rate & rhythm. 2/6 SEM early RUSB.  Lungs: clear Abdomen: soft, obese nontender, nondistended. No hepatosplenomegaly. No bruits or masses. Good bowel sounds. Extremities: no cyanosis, clubbing, rash.  1+ ankle edema.    Neuro: alert & orientedx3, cranial nerves grossly  intact. Moves all 4 extremities w/o difficulty. Affect pleasant.  ASSESSMENT & PLAN: 1. Chest pain: No recent chest pain. She has multiple RFs for CAD, so it is certainly possible that her prior chest tightness was due to angina.  We had wanted her to have a Cardiolite in 2016 but she cancelled the test.  At this point, with no chest pain, I think we could hold off  on the stress test.  Would need very high threshold for coronary angiography given CKD.  - Continue Imdur 30 daily, no aspirin due to apixaban use, continue statin.  2. Atrial fibrillation: Paroxysmal.  NSR today.  Continue apixaban (reduced dose given age and creatinine).   3. Hyperlipidemia: Continue statin.  4. HTN: BP has been running high.  She is taking both amlodipine and diltiazem CD. - Stop diltiazem and amlodipine, start nifedipine XL 120 mg daily.  - Add bisoprolol 2.5 mg daily for rate control if she were to go back into atrial fibrillation (had depression with atenolol so may not tolerate).   - Continue hydralazine, clonidine, and losartan at current doses. - She will call in 2 wks with BP readings on this new regimen.  5. Chronic diastolic CHF: Volume status looks ok.   - Continue bumetanide 1.5 qam, 1 qpm.  BMET today.   6. CKD: Stage III. Sees Dr Joelyn Oms. Creatinine has trended up at bit recently.  She remains on losartan, will try to keep her on this due to renoprotective effect.   Followup in 2 months.   Loralie Champagne 12/16/2015

## 2015-12-18 ENCOUNTER — Telehealth (HOSPITAL_COMMUNITY): Payer: Self-pay | Admitting: *Deleted

## 2015-12-18 MED ORDER — BUMETANIDE 1 MG PO TABS: 1.0000 mg | ORAL_TABLET | Freq: Every day | ORAL | 2 refills | Status: DC

## 2015-12-18 NOTE — Telephone Encounter (Signed)
Notes Recorded by Harvie Junior, CMA on 12/18/2015 at 3:58 PM EDT Patient aware. Medications updated in patients chart. Faxed order to [facility] to have labs drawn in 1 week.   ------  Notes Recorded by Larey Dresser, MD on 12/16/2015 at 10:45 PM EDT Decrease Bumetanide to 1 mg bid and repeat BMET 1 week.    Ref Range & Units 2d ago 45mo ago 48mo ago   Sodium 135 - 145 mmol/L 138 136 137   Potassium 3.5 - 5.1 mmol/L 3.7 3.9 3.7   Chloride 101 - 111 mmol/L 100  101 102   CO2 22 - 32 mmol/L 26 27 28    Glucose, Bld 65 - 99 mg/dL 194  268  204    BUN 6 - 20 mg/dL 55  40  40    Creatinine, Ser 0.44 - 1.00 mg/dL 2.32  1.97  1.95    Calcium 8.9 - 10.3 mg/dL 10.7  10.1 10.0   GFR calc non Af Amer >60 mL/min 18  22  23     GFR calc Af Amer >60 mL/min 21  26CM  26CM

## 2015-12-20 ENCOUNTER — Telehealth: Payer: Self-pay

## 2015-12-20 NOTE — Telephone Encounter (Signed)
Fax received.  Order description:  Remove additional SN Visit for this week D/T wounds healed.    Fax numbered and placed in provider's red folder for review and signature.

## 2015-12-20 NOTE — Telephone Encounter (Signed)
Received Home Health Certification and Plan of Care for SN Effective 12/29/15 1WK2, 1Q2WK2, 1WK1, [id number].  Documents numbered and placed in PCP's red folder for review and signature.

## 2015-12-23 NOTE — Telephone Encounter (Signed)
Received completed and signed Physician's Orders from Dr. Carollee Herter.  All paperwork faxed to [facility] at [phone]).  Confirmation received.//AB/CMA

## 2015-12-24 ENCOUNTER — Telehealth: Payer: Self-pay

## 2015-12-24 ENCOUNTER — Telehealth (HOSPITAL_COMMUNITY): Payer: Self-pay | Admitting: *Deleted

## 2015-12-24 DIAGNOSIS — I5032 Chronic diastolic (congestive) heart failure: Secondary | ICD-10-CM

## 2015-12-24 NOTE — Telephone Encounter (Signed)
Received order via fax from [facility] for the following:    Order description:   Obtain Blood Sample via venipuncture for BMET Dx 150.22 on 12/26/15; fax results to MD at [phone].    Form numbered and placed in Dr. Nonda Lou red folder for review and signature.

## 2015-12-24 NOTE — Telephone Encounter (Signed)
Sounds like she will need to stop bisoprolol.

## 2015-12-24 NOTE — Telephone Encounter (Signed)
Documents reviewed and signed by provider.  Forms faxed back to office.  Fax confirmation received.  Originals sent to be scanned.

## 2015-12-24 NOTE — Telephone Encounter (Signed)
Pt left VM earlier she was having SE of bisoprolol  Returned call to pt, she states since starting the Bisoprolol she has been very depressed and very nauseated to the point she does not want to eat.  She reported having similar SE to Atenolol in the past.  Advised pt to stop Bisoprolol for now, will send mess to Dr Aundra Dubin to let him know and f/u with her tomorrow, she is agreeable

## 2015-12-25 MED ORDER — CLONIDINE HCL 0.3 MG PO TABS: 0.4000 mg | ORAL_TABLET | Freq: Three times a day (TID) | ORAL | 1 refills | Status: DC

## 2015-12-25 NOTE — Telephone Encounter (Signed)
Pt aware and agreeable, she reports BP has been running high, 150-160s/60s.  Per Dr Aundra Dubin have pt increase Clonidine to 0.4 mg TID, pt aware and agreeable.

## 2016-01-01 NOTE — Telephone Encounter (Signed)
Form signed and faxed back.  Fax confirmation received.

## 2016-01-08 ENCOUNTER — Other Ambulatory Visit: Payer: Self-pay | Admitting: Family Medicine

## 2016-01-08 NOTE — Telephone Encounter (Signed)
Last seen 06/20/15 and filled 11/14/15 #60   Please advise    KP

## 2016-01-09 MED ORDER — TRAMADOL HCL 50 MG PO TABS: ORAL_TABLET | ORAL | 0 refills | Status: DC

## 2016-01-09 NOTE — Addendum Note (Signed)
Addended by: Ewing Schlein on: 01/09/2016 09:04 AM   Modules accepted: Orders

## 2016-01-09 NOTE — Telephone Encounter (Signed)
Rx faxed.    KP 

## 2016-02-06 ENCOUNTER — Telehealth: Payer: Self-pay

## 2016-02-06 NOTE — Telephone Encounter (Signed)
Shelia Woods, from [facility] called to get verbal order for patient to start a new wound order, for patient's returned wound.

## 2016-02-09 ENCOUNTER — Other Ambulatory Visit (HOSPITAL_COMMUNITY): Payer: Self-pay | Admitting: Student

## 2016-02-09 DIAGNOSIS — I10 Essential (primary) hypertension: Secondary | ICD-10-CM

## 2016-02-17 ENCOUNTER — Other Ambulatory Visit: Payer: Self-pay | Admitting: Family Medicine

## 2016-02-18 ENCOUNTER — Encounter (HOSPITAL_COMMUNITY): Payer: Self-pay

## 2016-02-18 ENCOUNTER — Ambulatory Visit (HOSPITAL_COMMUNITY)
Admission: RE | Admit: 2016-02-18 | Discharge: 2016-02-18 | Disposition: A | Discharge status: Home / Self Care | Payer: Medicare Other | Source: Ambulatory Visit | Attending: Cardiology | Admitting: Cardiology

## 2016-02-18 VITALS — BP 156/75 | HR 78 | Wt 213.2 lb

## 2016-02-18 DIAGNOSIS — N184 Chronic kidney disease, stage 4 (severe): Secondary | ICD-10-CM | POA: Insufficient documentation

## 2016-02-18 DIAGNOSIS — R002 Palpitations: Secondary | ICD-10-CM

## 2016-02-18 DIAGNOSIS — Z794 Long term (current) use of insulin: Secondary | ICD-10-CM | POA: Insufficient documentation

## 2016-02-18 DIAGNOSIS — I10 Essential (primary) hypertension: Secondary | ICD-10-CM

## 2016-02-18 DIAGNOSIS — Z79899 Other long term (current) drug therapy: Secondary | ICD-10-CM | POA: Diagnosis not present

## 2016-02-18 DIAGNOSIS — I5032 Chronic diastolic (congestive) heart failure: Secondary | ICD-10-CM | POA: Diagnosis not present

## 2016-02-18 DIAGNOSIS — I130 Hypertensive heart and chronic kidney disease with heart failure and stage 1 through stage 4 chronic kidney disease, or unspecified chronic kidney disease: Secondary | ICD-10-CM | POA: Insufficient documentation

## 2016-02-18 DIAGNOSIS — Z7901 Long term (current) use of anticoagulants: Secondary | ICD-10-CM | POA: Insufficient documentation

## 2016-02-18 DIAGNOSIS — Z87891 Personal history of nicotine dependence: Secondary | ICD-10-CM | POA: Insufficient documentation

## 2016-02-18 DIAGNOSIS — I480 Paroxysmal atrial fibrillation: Secondary | ICD-10-CM | POA: Diagnosis not present

## 2016-02-18 DIAGNOSIS — N183 Chronic kidney disease, stage 3 unspecified: Secondary | ICD-10-CM

## 2016-02-18 DIAGNOSIS — R5383 Other fatigue: Secondary | ICD-10-CM

## 2016-02-18 DIAGNOSIS — E119 Type 2 diabetes mellitus without complications: Secondary | ICD-10-CM | POA: Insufficient documentation

## 2016-02-18 LAB — BASIC METABOLIC PANEL
ANION GAP: 11 (ref 5–15)
BUN: 48 mg/dL — ABNORMAL HIGH (ref 6–20)
CO2: 27 mmol/L (ref 22–32)
Calcium: 10.9 mg/dL — ABNORMAL HIGH (ref 8.9–10.3)
Chloride: 99 mmol/L — ABNORMAL LOW (ref 101–111)
Creatinine, Ser: 2.01 mg/dL — ABNORMAL HIGH (ref 0.44–1.00)
GFR calc Af Amer: 25 mL/min — ABNORMAL LOW (ref >60)
GFR, EST NON AFRICAN AMERICAN: 22 mL/min — AB (ref >60)
Glucose, Bld: 157 mg/dL — ABNORMAL HIGH (ref 65–99)
POTASSIUM: 3.6 mmol/L (ref 3.5–5.1)
SODIUM: 137 mmol/L (ref 135–145)

## 2016-02-18 LAB — CBC
HEMATOCRIT: 32.4 % — AB (ref 36.0–46.0)
HEMOGLOBIN: 10.3 g/dL — AB (ref 12.0–15.0)
MCH: 27.3 pg (ref 26.0–34.0)
MCHC: 31.8 g/dL (ref 30.0–36.0)
MCV: 85.9 fL (ref 78.0–100.0)
Platelets: 421 10*3/uL — ABNORMAL HIGH (ref 150–400)
RBC: 3.77 MIL/uL — ABNORMAL LOW (ref 3.87–5.11)
RDW: 15.3 % (ref 11.5–15.5)
WBC: 9.3 10*3/uL (ref 4.0–10.5)

## 2016-02-18 MED ORDER — NEBIVOLOL HCL 5 MG PO TABS: 5.0000 mg | ORAL_TABLET | Freq: Every day | ORAL | 3 refills | Status: DC

## 2016-02-18 NOTE — Patient Instructions (Signed)
START Nebivolol (Bistolic) 5 mg tablet once daily.  Routine lab work today. Will notify you of abnormal results, otherwise no news is good news!  Will schedule you for home sleep study through [facility]. Address: [address], [place], [place] [address] Phone: [phone]  Will schedule you for a holter monitor (heart monitor) to be placed at [facility] office. Address: [address] ([place]), [place], [place] [address]  Phone: [phone]  EKG today.  Follow up 6 weeks with Dr. Aundra Dubin.  Do the following things EVERYDAY: 1) Weigh yourself in the morning before breakfast. Write it down and keep it in a log. 2) Take your medicines as prescribed 3) Eat low salt foods-Limit salt (sodium) to 2000 mg per day.  4) Stay as active as you can everyday 5) Limit all fluids for the day to less than 2 liters

## 2016-02-19 ENCOUNTER — Telehealth: Payer: Self-pay | Admitting: Cardiology

## 2016-02-19 NOTE — Telephone Encounter (Signed)
New Message:     Please call,concerning orders for Blondell Reveal please.

## 2016-02-19 NOTE — Telephone Encounter (Signed)
Spoke w/Linda, she received order in que for pt's home sleep study and wanted to clarify what pt needs.  She states they can only order and do test for their pt so for them to do this test pt would have to come in for a consult with one of their MD's.  Order was originally written by Vilinda Blanks, RN discussed w/her, she states pt needed a sleep study per Dr Aundra Dubin and requested a home test so order was order.  Renato Gails to cancel order pt does not need to see neuro, we will arrange sleep study.

## 2016-02-19 NOTE — Progress Notes (Signed)
ADVANCED HF CLINIC NOTE  Patient ID: Shelia Woods, female   DOB: 1931-02-26, 82 y.o.   MRN: [id number] PCP: Dr. Birdie Riddle Cardiology: Dr. Aundra Dubin  Ms. Thammavong is an 82 y/o woman with h/o obesity, HTN, CKD stage 3-4, PAF, diabetes.    She had a nuclear stress test in 2007 that showed EF 69% and no ischemia. She has never has a cath. Renal dopplers in 2009 showed no RAS.   She does not ambulate much due to her severe back pain and uses a walker or wheelchair.   Has a h/o PAF. Event monitor in 10/16 showed 1 run of atrial fibrillation.  She is on apixaban 2.5 mg bid.   Sleeps in chair due to her back. No chest pain. She is tolerating Eliquis without overt bleeding.  She remains very limited by back pain.  She walks with walker, can go about 50 feet at a time, will be short of breath after that.  Weight is down 8 lbs.  At last appointment, I stopped amlodipine and diltiazem CD (she was taking both) and put her on nifedipine XL and bisoprolol.  She did not tolerate bisoprolol due to depressed mood (had same problem with atenolol in past).  Since stopping diltiazem, she feels like her heart races when she walks (to bathroom, etc).  SBP still runs high in the 140s-150s range at home.     Labs (10/16): LDL 89, K 4.1, creatinine 1.71, hgb 11.3 Labs (2/17): K 3.7, creatinine 1.58, LDL 93, TGs 285 Labs (8/17): K 4.4, creatinine 2.33 Labs (9/17): K 3.7, creatinine 2.04  ECG: NSR, 1st degree AV block.   PMH: 1. Obesity 2. HTN: Renal artery dopplers (2009) with no renal artery stenosis. 3. CKD III-IV 4. Type II diabetes 5. Atrial fibrillation: Paroxysmal.  Event monitor (10/16) showed average HR 53 with 1 brief run of atrial fibrillation (controlled rate).  6. Cardiolite (2007) with EF 69%, no ischemia.  7. Hyperlipidemia 8. Chronic diastolic CHF: Echo (90/24) with EF 60-65%, mild LVH.  Echo (11/16) with EF 60-65%, moderate LVH.  9. Low back pain: Severe and debilitating.   ROS: All systems negative  except as listed in HPI, PMH and Problem List.  SH:  Social History   Social History  . Marital status: Married    Spouse name: N/A  . Number of children: 4  . Years of education: N/A   Occupational History  .  Retired    retired/disabled (1994)   Social History Main Topics  . Smoking status: Former Smoker    Quit date: 04/27/1978  . Smokeless tobacco: Never Used  . Alcohol use No  . Drug use: No  . Sexual activity: Not on file   Other Topics Concern  . Not on file   Social History Narrative   Four children   Regular exercise-no    FH:  Family History  Problem Relation Age of Onset  . Diabetes Sister   . Parkinsonism Sister     Current Outpatient Prescriptions  Medication Sig Dispense Refill  . acetaminophen (TYLENOL) 500 MG tablet Take 1,000 mg by mouth 3 (three) times daily.     Marland Kitchen apixaban (ELIQUIS) 2.5 MG TABS tablet Take 1 tablet (2.5 mg total) by mouth 2 (two) times daily. 180 tablet 3  . atorvastatin (LIPITOR) 40 MG tablet Take 1 tablet (40 mg total) by mouth at bedtime. 90 tablet 1  . Blood Glucose Monitoring Suppl (ONE TOUCH ULTRA SYSTEM KIT) W/DEVICE KIT 1 kit by  Does not apply route once. E11.9 1 each 0  . bumetanide (BUMEX) 1 MG tablet Take 1 tablet (1 mg total) by mouth daily. Take 1.5 mg (1.5 tabs) in am and 1 mg (1 tab) in pm. 90 tablet 2  . calcium carbonate (CALCIUM 600) 600 MG TABS tablet Take 1 tablet (600 mg total) by mouth every evening. 180 tablet 1  . chlorthalidone (HYGROTON) 25 MG tablet TAKE 1 TABLET(25 MG) BY MOUTH DAILY 30 tablet 3  . cloNIDine (CATAPRES) 0.3 MG tablet Take 1.5 tablets (0.45 mg total) by mouth 3 (three) times daily. 270 tablet 1  . fenofibrate 160 MG tablet TAKE 1 TABLET(160 MG) BY MOUTH EVERY EVENING 90 tablet 0  . fluocinonide cream (LIDEX) 0.05 % APPLY EXTERNALLY TO THE AFFECTED AREA TWICE DAILY 120 g 0  . Glucosamine-Chondroitin (OSTEO BI-FLEX REGULAR STRENGTH) 250-200 MG TABS Take 1 tablet by mouth 2 (two) times daily.      . hydrALAZINE (APRESOLINE) 100 MG tablet Take 1 tablet (100 mg total) by mouth 3 (three) times daily. 90 tablet 6  . insulin NPH Human (HUMULIN N,NOVOLIN N) 100 UNIT/ML injection Inject 0.3 mLs (30 Units total) into the skin at bedtime. 3 vial 3  . insulin regular (NOVOLIN R,HUMULIN R) 100 units/mL injection 15 units in the morning, 15 units midday and 20 units in the evening 10 mL 2  . isosorbide mononitrate (IMDUR) 30 MG 24 hr tablet TAKE 1 TABLET BY MOUTH DAILY 90 tablet 3  . loratadine (CLARITIN) 10 MG tablet Take 10 mg by mouth daily as needed for allergies.     . losartan (COZAAR) 100 MG tablet TAKE 1 TABLET(100 MG) BY MOUTH DAILY 30 tablet 3  . Melatonin 1 MG TABS Take 1 mg by mouth at bedtime.    . Multiple Vitamins-Minerals (PRESERVISION AREDS 2 PO) Take 1 capsule by mouth 2 (two) times daily.     . NIFEdipine (PROCARDIA XL/ADALAT-CC) 60 MG 24 hr tablet Take 2 tablets (120 mg total) by mouth every morning. 180 tablet 1  . ONE TOUCH ULTRA TEST test strip TEST TWICE DAILY AS DIRECTED 100 each 0  . ONETOUCH DELICA LANCETS 33G MISC Use one lancet each time sugars are checked. Pt tests twice daily. 100 each 12  . ONETOUCH DELICA LANCETS FINE MISC USE TWICE DAILY 100 each 5  . polyethylene glycol (MIRALAX / GLYCOLAX) packet Take 17 g by mouth 2 (two) times daily as needed for mild constipation.    . traMADol (ULTRAM) 50 MG tablet TAKE 1/2 TO 1 TABLET BY MOUTH EVERY 6 HOURS AS NEEDED FOR MODERATE PAIN 60 tablet 0  . nebivolol (BYSTOLIC) 5 MG tablet Take 1 tablet (5 mg total) by mouth daily. 30 tablet 3   No current facility-administered medications for this encounter.     Vitals:   02/18/16 1345  BP: (!) 156/75  Pulse: 78  SpO2: 97%  Weight: 213 lb 4 oz (96.7 kg)    PHYSICAL EXAM:  General:  Elderly woman sitting in WC. No resp difficulty HEENT: normal Neck: supple. JVP 7 cm. Carotids 2+ bilaterally; no bruits. No lymphadenopathy or thryomegaly appreciated. Cor: PMI normal.  Regular rate & rhythm. 2/6 SEM early RUSB.  Lungs: clear Abdomen: soft, obese nontender, nondistended. No hepatosplenomegaly. No bruits or masses. Good bowel sounds. Extremities: no cyanosis, clubbing, rash.  1+ ankle edema.    Neuro: alert & orientedx3, cranial nerves grossly intact. Moves all 4 extremities w/o difficulty. Affect pleasant.  ASSESSMENT & PLAN: 1.   Chest pain: No recent chest pain. She has multiple RFs for CAD, so it is certainly possible that her prior chest tightness was due to angina.  We had wanted her to have a Cardiolite in 2016 but she cancelled the test.  At this point, with no chest pain, I think we could hold off on the stress test.  Would need very high threshold for coronary angiography given CKD.  - Continue Imdur 30 daily, no aspirin due to apixaban use, continue statin.  2. Atrial fibrillation: Paroxysmal.  NSR today.  Continue apixaban (reduced dose given age and creatinine).   3. Hyperlipidemia: Continue statin.  4. HTN: BP has been running high.  She feels like HR is running high off diltiazem and she was unable to tolerate bisoprolol.  - Try nebivolol 5 mg daily.  She will let us know if this causes depression also.  - Continue hydralazine, clonidine, and losartan at current doses. 5. Chronic diastolic CHF: Volume status looks ok.   - Continue bumetanide 1 mg bid.  BMET today.   6. CKD: Stage III. Sees Dr Joelyn Oms. Creatinine has trended up at bit recently.  She remains on losartan, will try to keep her on this due to renoprotective effect.  7. Palpitations: Notes with exertion.  This may just be due to deconditioning with elevated HR response with exercise.  She also is off her diltiazem.  Will get 48 holter to screen for significant arrhythmia.  8. Suspect OSA: I will try to arrange for a home sleep study.   Followup in 6 wks.    Loralie Champagne 02/19/2016

## 2016-02-19 NOTE — Telephone Encounter (Signed)
Pt seen in CHF clinic.  Will forward to CHF staff.

## 2016-02-20 ENCOUNTER — Telehealth (HOSPITAL_COMMUNITY): Payer: Self-pay | Admitting: *Deleted

## 2016-02-20 ENCOUNTER — Telehealth: Payer: Self-pay | Admitting: *Deleted

## 2016-02-20 NOTE — Telephone Encounter (Signed)
Order submitted to [facility] for Home Sleep Study.  Order # [id number]

## 2016-02-20 NOTE — Telephone Encounter (Signed)
Pt called to report she started Bystolic 5 mg qPM Tue and today her HR is 42.  She is asymptomatic but concerned with it being that low.  She states is usually runs 60s and then jumps up to 110s, but all day today as been in 40s.  Advised pt to hold today's dose and I will discuss w/Dr Aundra Dubin and call her back tomorrow.  She is aware and agreeable, she is sch to get holter monitor 11/7 and wants to know if she needs it or not.  Again will send to Dr Aundra Dubin to review

## 2016-02-20 NOTE — Telephone Encounter (Signed)
Decrease Bystolic to 2.5 daily and go ahead and have holter done.

## 2016-02-21 MED ORDER — NEBIVOLOL HCL 5 MG PO TABS: 2.5000 mg | ORAL_TABLET | Freq: Every day | ORAL | 3 refills | Status: DC

## 2016-02-21 NOTE — Telephone Encounter (Signed)
Pt aware and agreeable w/plan

## 2016-02-25 ENCOUNTER — Other Ambulatory Visit (HOSPITAL_COMMUNITY): Payer: Self-pay | Admitting: Internal Medicine

## 2016-02-26 DIAGNOSIS — E1142 Type 2 diabetes mellitus with diabetic polyneuropathy: Secondary | ICD-10-CM | POA: Diagnosis not present

## 2016-02-26 DIAGNOSIS — M150 Primary generalized (osteo)arthritis: Secondary | ICD-10-CM | POA: Diagnosis not present

## 2016-02-26 DIAGNOSIS — Z794 Long term (current) use of insulin: Secondary | ICD-10-CM | POA: Diagnosis not present

## 2016-02-26 DIAGNOSIS — Z7901 Long term (current) use of anticoagulants: Secondary | ICD-10-CM | POA: Diagnosis not present

## 2016-02-26 DIAGNOSIS — I10 Essential (primary) hypertension: Secondary | ICD-10-CM | POA: Diagnosis not present

## 2016-02-26 DIAGNOSIS — Z9181 History of falling: Secondary | ICD-10-CM | POA: Diagnosis not present

## 2016-02-26 DIAGNOSIS — L89322 Pressure ulcer of left buttock, stage 2: Secondary | ICD-10-CM | POA: Diagnosis not present

## 2016-02-26 DIAGNOSIS — E1143 Type 2 diabetes mellitus with diabetic autonomic (poly)neuropathy: Secondary | ICD-10-CM | POA: Diagnosis not present

## 2016-02-27 ENCOUNTER — Encounter: Payer: Self-pay | Admitting: Cardiology

## 2016-03-03 ENCOUNTER — Ambulatory Visit (INDEPENDENT_AMBULATORY_CARE_PROVIDER_SITE_OTHER): Payer: Medicare Other

## 2016-03-03 DIAGNOSIS — R002 Palpitations: Secondary | ICD-10-CM

## 2016-03-12 ENCOUNTER — Other Ambulatory Visit (HOSPITAL_COMMUNITY): Payer: Self-pay | Admitting: *Deleted

## 2016-03-13 ENCOUNTER — Other Ambulatory Visit: Payer: Self-pay | Admitting: Family Medicine

## 2016-03-13 NOTE — Telephone Encounter (Signed)
rx faxed   pc 

## 2016-03-13 NOTE — Telephone Encounter (Signed)
Last seen 06/20/15 Last filled #[id number] [phone] rf 01/09/16 Please advise PC

## 2016-03-16 ENCOUNTER — Other Ambulatory Visit: Payer: Self-pay | Admitting: Family Medicine

## 2016-04-07 ENCOUNTER — Encounter (HOSPITAL_COMMUNITY): Payer: Self-pay

## 2016-04-07 ENCOUNTER — Ambulatory Visit (HOSPITAL_COMMUNITY)
Admission: RE | Admit: 2016-04-07 | Discharge: 2016-04-07 | Disposition: A | Discharge status: Home / Self Care | Payer: Medicare Other | Source: Ambulatory Visit | Attending: Cardiology | Admitting: Cardiology

## 2016-04-07 VITALS — BP 132/60 | HR 61 | Wt 211.2 lb

## 2016-04-07 DIAGNOSIS — Z5189 Encounter for other specified aftercare: Secondary | ICD-10-CM | POA: Insufficient documentation

## 2016-04-07 DIAGNOSIS — N184 Chronic kidney disease, stage 4 (severe): Secondary | ICD-10-CM | POA: Insufficient documentation

## 2016-04-07 DIAGNOSIS — I5032 Chronic diastolic (congestive) heart failure: Secondary | ICD-10-CM | POA: Diagnosis not present

## 2016-04-07 DIAGNOSIS — I480 Paroxysmal atrial fibrillation: Secondary | ICD-10-CM

## 2016-04-07 DIAGNOSIS — Z794 Long term (current) use of insulin: Secondary | ICD-10-CM | POA: Diagnosis not present

## 2016-04-07 DIAGNOSIS — Z79899 Other long term (current) drug therapy: Secondary | ICD-10-CM | POA: Diagnosis not present

## 2016-04-07 DIAGNOSIS — N183 Chronic kidney disease, stage 3 unspecified: Secondary | ICD-10-CM

## 2016-04-07 DIAGNOSIS — I130 Hypertensive heart and chronic kidney disease with heart failure and stage 1 through stage 4 chronic kidney disease, or unspecified chronic kidney disease: Secondary | ICD-10-CM | POA: Insufficient documentation

## 2016-04-07 DIAGNOSIS — E785 Hyperlipidemia, unspecified: Secondary | ICD-10-CM | POA: Insufficient documentation

## 2016-04-07 DIAGNOSIS — Z87891 Personal history of nicotine dependence: Secondary | ICD-10-CM | POA: Insufficient documentation

## 2016-04-07 MED ORDER — BUMETANIDE 1 MG PO TABS: 1.0000 mg | ORAL_TABLET | Freq: Two times a day (BID) | ORAL | 2 refills | Status: DC

## 2016-04-07 NOTE — Patient Instructions (Signed)
Take Bumex 1 mg Twice daily   Your physician recommends that you schedule a follow-up appointment in: 3 months

## 2016-04-08 NOTE — Progress Notes (Signed)
ADVANCED HF CLINIC NOTE  Patient ID: Shelia Woods, female   DOB: [birth year], 82 y.o.   MRN: [id number] PCP: Dr. Birdie Riddle Cardiology: Dr. Aundra Dubin  Ms. Navarrete is an 82 y/o woman with h/o obesity, HTN, CKD stage 3-4, PAF, diabetes.    She had a nuclear stress test in 2007 that showed EF 69% and no ischemia. She has never has a cath. Renal dopplers in 2009 showed no RAS.   She does not ambulate much due to her severe back pain and uses a walker or wheelchair.   Has a h/o PAF. Event monitor in 10/16 showed 1 run of atrial fibrillation.  She is on apixaban 2.5 mg bid.   Sleeps in chair due to her back. No chest pain. She is tolerating Eliquis without overt bleeding.  She remains very limited by back pain.  She walks with walker, can go about 50 feet at a time, will be short of breath after that.  Weight is down 2 lbs.  BP is better-controlled now.  She has tolerated nebivolol.    Labs (10/16): LDL 89, K 4.1, creatinine 1.71, hgb 11.3 Labs (2/17): K 3.7, creatinine 1.58, LDL 93, TGs 285 Labs (8/17): K 4.4, creatinine 2.33 Labs (9/17): K 3.7, creatinine 2.04 Labs (10/17): K 3.6, creatinine 2.01, hgb 10.3  PMH: 1. Obesity 2. HTN: Renal artery dopplers (2009) with no renal artery stenosis. 3. CKD III-IV 4. Type II diabetes 5. Atrial fibrillation: Paroxysmal.  Event monitor (10/16) showed average HR 53 with 1 brief run of atrial fibrillation (controlled rate).  - Holter (11/17): rare PACs and PVCs.   6. Cardiolite (2007) with EF 69%, no ischemia.  7. Hyperlipidemia 8. Chronic diastolic CHF: Echo (23/53) with EF 60-65%, mild LVH.  Echo (11/16) with EF 60-65%, moderate LVH.  9. Low back pain: Severe and debilitating.   ROS: All systems negative except as listed in HPI, PMH and Problem List.  Social History   Social History  . Marital status: Married    Spouse name: N/A  . Number of children: 4  . Years of education: N/A   Occupational History  .  Retired    retired/disabled (1994)    Social History Main Topics  . Smoking status: Former Smoker    Quit date: 04/27/1978  . Smokeless tobacco: Never Used  . Alcohol use No  . Drug use: No  . Sexual activity: Not on file   Other Topics Concern  . Not on file   Social History Narrative   Four children   Regular exercise-no    Family History  Problem Relation Age of Onset  . Diabetes Sister   . Parkinsonism Sister     Current Outpatient Prescriptions  Medication Sig Dispense Refill  . acetaminophen (TYLENOL) 500 MG tablet Take 1,000 mg by mouth 3 (three) times daily.     Marland Kitchen apixaban (ELIQUIS) 2.5 MG TABS tablet Take 1 tablet (2.5 mg total) by mouth 2 (two) times daily. 180 tablet 3  . atorvastatin (LIPITOR) 40 MG tablet TAKE 1 TABLET(40 MG) BY MOUTH AT BEDTIME 90 tablet 1  . Blood Glucose Monitoring Suppl (ONE TOUCH ULTRA SYSTEM KIT) W/DEVICE KIT 1 kit by Does not apply route once. E11.9 1 each 0  . bumetanide (BUMEX) 1 MG tablet Take 1 tablet (1 mg total) by mouth 2 (two) times daily. Take 1.5 mg (1.5 tabs) in am and 1 mg (1 tab) in pm. 90 tablet 2  . calcium carbonate (CALCIUM 600) 600 MG TABS  tablet Take 1 tablet (600 mg total) by mouth every evening. 180 tablet 1  . chlorthalidone (HYGROTON) 25 MG tablet TAKE 1 TABLET(25 MG) BY MOUTH DAILY 30 tablet 3  . cloNIDine (CATAPRES) 0.3 MG tablet Take 1.5 tablets (0.45 mg total) by mouth 3 (three) times daily. 270 tablet 1  . ELIQUIS 2.5 MG TABS tablet TAKE 1 TABLET BY MOUTH TWICE DAILY 60 tablet 3  . fenofibrate 160 MG tablet TAKE 1 TABLET(160 MG) BY MOUTH EVERY EVENING 90 tablet 0  . fluocinonide cream (LIDEX) 0.05 % APPLY EXTERNALLY TO THE AFFECTED AREA TWICE DAILY 120 g 0  . Glucosamine-Chondroitin (OSTEO BI-FLEX REGULAR STRENGTH) 250-200 MG TABS Take 1 tablet by mouth 2 (two) times daily.     . hydrALAZINE (APRESOLINE) 100 MG tablet Take 1 tablet (100 mg total) by mouth 3 (three) times daily. 90 tablet 6  . insulin NPH Human (HUMULIN N,NOVOLIN N) 100 UNIT/ML  injection Inject 0.3 mLs (30 Units total) into the skin at bedtime. 3 vial 3  . insulin regular (NOVOLIN R,HUMULIN R) 100 units/mL injection 15 units in the morning, 15 units midday and 20 units in the evening 10 mL 2  . isosorbide mononitrate (IMDUR) 30 MG 24 hr tablet TAKE 1 TABLET BY MOUTH DAILY 90 tablet 3  . loratadine (CLARITIN) 10 MG tablet Take 10 mg by mouth daily as needed for allergies.     Marland Kitchen losartan (COZAAR) 100 MG tablet TAKE 1 TABLET(100 MG) BY MOUTH DAILY 30 tablet 3  . Melatonin 1 MG TABS Take 1 mg by mouth at bedtime.    . Multiple Vitamins-Minerals (PRESERVISION AREDS 2 PO) Take 1 capsule by mouth 2 (two) times daily.     . nebivolol (BYSTOLIC) 5 MG tablet Take 0.5 tablets (2.5 mg total) by mouth daily. 30 tablet 3  . NIFEdipine (PROCARDIA XL/ADALAT-CC) 60 MG 24 hr tablet Take 2 tablets (120 mg total) by mouth every morning. 180 tablet 1  . ONE TOUCH ULTRA TEST test strip TEST TWICE DAILY AS DIRECTED 100 each 0  . ONETOUCH DELICA LANCETS 35W MISC Use one lancet each time sugars are checked. Pt tests twice daily. 100 each 12  . ONETOUCH DELICA LANCETS FINE MISC USE TWICE DAILY 100 each 5  . polyethylene glycol (MIRALAX / GLYCOLAX) packet Take 17 g by mouth 2 (two) times daily as needed for mild constipation.    . traMADol (ULTRAM) 50 MG tablet TAKE 1/2-1 TABLET BY MOUTH EVERY 6 HOURS AS NEEDED FOR MODERATE PAIN 60 tablet 0   No current facility-administered medications for this encounter.     Vitals:   04/07/16 1359  BP: 132/60  Pulse: 61  SpO2: 98%  Weight: 211 lb 4 oz (95.8 kg)    PHYSICAL EXAM:  General:  Elderly woman sitting in WC. No resp difficulty HEENT: normal Neck: supple. JVP 7 cm. Carotids 2+ bilaterally; no bruits. No lymphadenopathy or thryomegaly appreciated. Cor: PMI normal. Regular rate & rhythm. 2/6 SEM early RUSB.  Lungs: clear Abdomen: soft, obese nontender, nondistended. No hepatosplenomegaly. No bruits or masses. Good bowel  sounds. Extremities: no cyanosis, clubbing, rash.  No edema.    Neuro: alert & orientedx3, cranial nerves grossly intact. Moves all 4 extremities w/o difficulty. Affect pleasant.  ASSESSMENT & PLAN: 1. Chest pain: No recent chest pain. She has multiple RFs for CAD, so it is certainly possible that her prior chest tightness was due to angina.  We had wanted her to have a Cardiolite in 2016 but  she cancelled the test.  At this point, with no chest pain, I think we could hold off on the stress test.  Would need very high threshold for coronary angiography given CKD.  - Continue Imdur 30 daily, no aspirin due to apixaban use, continue statin.  2. Atrial fibrillation: Paroxysmal.  NSR today.  Continue apixaban (reduced dose given age and creatinine).   3. Hyperlipidemia: Continue statin.  4. HTN: BP now controlled.  - She has been able to tolerate nebivolol.   - Continue hydralazine, clonidine, and losartan at current doses. 5. Chronic diastolic CHF: Volume status looks ok.   - Continue bumetanide 1 mg bid.  She had labs yesterday with Dr. Joelyn Oms, I will try to get results.    6. CKD: Stage III. Sees Dr Joelyn Oms.   Followup in 4 months  Loralie Champagne 04/08/2016

## 2016-04-28 ENCOUNTER — Telehealth (HOSPITAL_COMMUNITY): Payer: Self-pay | Admitting: *Deleted

## 2016-04-28 MED ORDER — HYDRALAZINE HCL 100 MG PO TABS: 100.0000 mg | ORAL_TABLET | Freq: Three times a day (TID) | ORAL | 6 refills | Status: DC

## 2016-04-29 ENCOUNTER — Telehealth: Payer: Self-pay | Admitting: *Deleted

## 2016-04-29 ENCOUNTER — Encounter: Payer: Self-pay | Admitting: Family Medicine

## 2016-04-29 ENCOUNTER — Other Ambulatory Visit (HOSPITAL_COMMUNITY): Payer: Self-pay | Admitting: *Deleted

## 2016-04-29 MED ORDER — HYDRALAZINE HCL 100 MG PO TABS: 100.0000 mg | ORAL_TABLET | Freq: Three times a day (TID) | ORAL | 6 refills | Status: DC

## 2016-04-29 NOTE — Telephone Encounter (Signed)
Great - thanks

## 2016-04-29 NOTE — Telephone Encounter (Signed)
Patient requesting medication please send to  [facility], [address] AT [place] [phone] (Phone) [phone] (Fax)   Patient states please remove [place] from file

## 2016-04-29 NOTE — Telephone Encounter (Signed)
Dr. Etter Sjogren received result form Dr. Joelyn Oms.  Hemoglobin low 9.0.  She needs to do hemmocult cards or needs OV to get checked.    Patient notified and per patient Dr. Melene Muller sent her the hemmocult cards to send back and she is on an iron supplement.    Dr. Jyl Heinz

## 2016-04-29 NOTE — Telephone Encounter (Signed)
error:[id number] ° °

## 2016-05-11 ENCOUNTER — Other Ambulatory Visit: Payer: Self-pay | Admitting: Family Medicine

## 2016-05-11 ENCOUNTER — Encounter: Payer: Self-pay | Admitting: Family Medicine

## 2016-05-12 ENCOUNTER — Telehealth: Payer: Self-pay | Admitting: Family Medicine

## 2016-05-12 NOTE — Telephone Encounter (Signed)
Faxed lab results from [facility] to [place]. Dr. Joelyn Oms at [phone] per PCP instructions.

## 2016-05-12 NOTE — Telephone Encounter (Signed)
Spoke to [employer] informed Shelia Woods of PCP instructions/she stated would take care of.

## 2016-05-12 NOTE — Telephone Encounter (Signed)
Shelia Woods with [facility] called with lab results for the patient. They would like a call back to provide the results. Please advise  Phone: [phone]

## 2016-05-12 NOTE — Telephone Encounter (Signed)
Called [facility] and they are faxing over these results, but did want to verbally give those in need of review. Hemoglobin  7.2 Hematocrit  23.0 Platelets  415 BUN   78 Creatinine 2.35 Iron saturation  10 RBC   2.60

## 2016-05-12 NOTE — Telephone Encounter (Signed)
Dropping more--- according to last note a Dr Joelyn Oms was handling her hgb--- who is that?  If he/ she is not she may need to go to hospital

## 2016-05-12 NOTE — Telephone Encounter (Signed)
Will route message to PCP. Mariann Laster RN at [employer] was informed of PCP instructions regarding this patient and stated she would take care of.

## 2016-05-14 ENCOUNTER — Other Ambulatory Visit: Payer: Self-pay | Admitting: Family Medicine

## 2016-05-15 ENCOUNTER — Telehealth: Payer: Self-pay | Admitting: Family Medicine

## 2016-05-15 NOTE — Telephone Encounter (Signed)
Relation to [id number] Call back [phone]   Reason for call:  Patient would like to discuss her hemoglobin and receiving a blood transfusion, please advise

## 2016-05-15 NOTE — Telephone Encounter (Signed)
Pt called in to follow up. She says that she will not have transportation for next week. She need to speak with provider as soon as possible.

## 2016-05-15 NOTE — Telephone Encounter (Signed)
Received refill request for Tramadol 50mg  (take 1 tab po q6h as needed for moderate pain), #[id number] [phone]  Last Rf: 03/13/2016 Last Ov: 06/20/2015 Next Ov: 06/19/2016 UDS: None  Forwarded to Provider for review, approval or denial.

## 2016-05-15 NOTE — Telephone Encounter (Signed)
We can't set that up know and not without see her-- she will need to go to ER

## 2016-05-15 NOTE — Telephone Encounter (Signed)
Faxed a hard copy of Tramadol 50mg  to [facility] [id number]: [phone].

## 2016-05-15 NOTE — Telephone Encounter (Signed)
Patient notified of what she needs to do.  She is upset that she did not receive a call back from Dr. Etter Sjogren about this.  I apologized how long it took to get back with her.    FYI Dr. Etter Sjogren

## 2016-05-18 NOTE — Telephone Encounter (Signed)
Mandie, called back and reported that Dr. Marcelyn Ditty office agreed that patient should go to the ER.  Pt has agreed to go, but she is upset.    Message routed to PCP for FYI.

## 2016-05-18 NOTE — Telephone Encounter (Signed)
Mandie, from [facility] called back and stated that she was there with the patient.  Gave a brief report of what all has been going on and how patient has been advised several time to go to the ER.  Mandie stated that patient did not want to go to the ER for fear of getting the flu, etc.  She said pt's VS were stable, but that patient did report feeling lightheaded.  Mandie stated she would call Dr. Marcelyn Ditty office to also report findings.

## 2016-05-18 NOTE — Telephone Encounter (Signed)
Called Dr. Marcelyn Ditty office and spoke to his nurse, Charlena Cross.  She reported that their EMR has been down for the past 3 days or so, and they have been unable to get into patient's chart.  She said that Dr. Carmina Miller was somewhat aware of patient's hemoglobin, but has been uable to address it, due to not being able to get into patient's chart.   Charlena Cross stated that she did call the patient last week and advised her husband, if patient was feeling symptomatic, then she needed to go to the ER.  Pt did not want to go to ER.  Charlena Cross stated that she would advise that patient ER.  She's not sure when the system would be back up.  Spoke to [place] with [facility].  She stated that she called Dr. Marcelyn Ditty office and made them aware of patient's hemoglobin.  Stated that she spoke to a [place] reported that she called Ms. Jeffus and advised her to go to the ER.  Mariann Laster stated she was unaware if patient had gone or not.  Informed Mariann Laster that she had not gone and that I would call patient to see how she was doing today.  Mariann Laster stated that patient is scheduled for a visit today.  She said she would have nurse call office once patient is seen.    Called patient, pt stated that she was feeling a little lightheaded this morning.  She was again advised to go to the ER, but she declined, stating she was waiting to hear back from Dr. Marcelyn Ditty office.  She said she was informed this morning by the office that they would write the order for a transfusion.  Pt was informed that I just got off the phone with Dr. Marcelyn Ditty office and that due to their EMR system being down, they advised that she go to the ER. Pt stated understanding, thanked me for the call, but stated she would wait to hear back from Dr. Marcelyn Ditty office.

## 2016-05-18 NOTE — Telephone Encounter (Signed)
noted 

## 2016-05-18 NOTE — Telephone Encounter (Signed)
Please call home health--- pt told us the nephrologist was taking care of this--- please also call nephrologist-- Dr Joelyn Oms----  We were under the impression he was taking care of this Pt was upset we did not order the tranfusion--   We have not seen her in a while

## 2016-05-19 ENCOUNTER — Ambulatory Visit (HOSPITAL_COMMUNITY)
Admission: RE | Admit: 2016-05-19 | Discharge: 2016-05-19 | Disposition: A | Discharge status: Home / Self Care | Payer: Medicare Other | Source: Ambulatory Visit | Attending: Nephrology | Admitting: Nephrology

## 2016-05-19 ENCOUNTER — Other Ambulatory Visit (HOSPITAL_COMMUNITY): Payer: Self-pay | Admitting: *Deleted

## 2016-05-19 DIAGNOSIS — D649 Anemia, unspecified: Secondary | ICD-10-CM | POA: Diagnosis not present

## 2016-05-19 LAB — ABO/RH: ABO/RH(D): A NEG

## 2016-05-19 LAB — RENAL FUNCTION PANEL
ALBUMIN: 3 g/dL — AB (ref 3.5–5.0)
ANION GAP: 10 (ref 5–15)
BUN: 60 mg/dL — ABNORMAL HIGH (ref 6–20)
CALCIUM: 9.8 mg/dL (ref 8.9–10.3)
CO2: 25 mmol/L (ref 22–32)
CREATININE: 2.43 mg/dL — AB (ref 0.44–1.00)
Chloride: 103 mmol/L (ref 101–111)
GFR, EST AFRICAN AMERICAN: 20 mL/min — AB (ref >60)
GFR, EST NON AFRICAN AMERICAN: 17 mL/min — AB (ref >60)
Glucose, Bld: 170 mg/dL — ABNORMAL HIGH (ref 65–99)
PHOSPHORUS: 2.9 mg/dL (ref 2.5–4.6)
Potassium: 3.8 mmol/L (ref 3.5–5.1)
SODIUM: 138 mmol/L (ref 135–145)

## 2016-05-19 LAB — CBC
HCT: 25.3 % — ABNORMAL LOW (ref 36.0–46.0)
HEMOGLOBIN: 7.7 g/dL — AB (ref 12.0–15.0)
MCH: 27.1 pg (ref 26.0–34.0)
MCHC: 30.4 g/dL (ref 30.0–36.0)
MCV: 89.1 fL (ref 78.0–100.0)
PLATELETS: 486 10*3/uL — AB (ref 150–400)
RBC: 2.84 MIL/uL — AB (ref 3.87–5.11)
RDW: 17 % — ABNORMAL HIGH (ref 11.5–15.5)
WBC: 7.8 10*3/uL (ref 4.0–10.5)

## 2016-05-19 LAB — PREPARE RBC (CROSSMATCH)

## 2016-05-19 MED ORDER — ACETAMINOPHEN 325 MG PO TABS: 650.0000 mg | ORAL_TABLET | Freq: Once | ORAL | Status: DC

## 2016-05-19 MED ORDER — SODIUM CHLORIDE 0.9 % IV SOLN
Freq: Once | INTRAVENOUS | Status: AC
  Administered 2016-05-19: 10:00:00 via INTRAVENOUS

## 2016-05-19 MED ORDER — ACETAMINOPHEN 325 MG PO TABS
ORAL_TABLET | ORAL | Status: AC
  Filled 2016-05-19: qty 2

## 2016-05-20 LAB — TYPE AND SCREEN
ABO/RH(D): A NEG
Antibody Screen: NEGATIVE
UNIT DIVISION: 0
UNIT DIVISION: 0

## 2016-05-21 ENCOUNTER — Other Ambulatory Visit (HOSPITAL_COMMUNITY): Payer: Self-pay | Admitting: Adult Health

## 2016-05-21 ENCOUNTER — Other Ambulatory Visit (HOSPITAL_COMMUNITY): Payer: Self-pay | Admitting: Student

## 2016-05-21 DIAGNOSIS — I5032 Chronic diastolic (congestive) heart failure: Secondary | ICD-10-CM

## 2016-05-21 DIAGNOSIS — I10 Essential (primary) hypertension: Secondary | ICD-10-CM

## 2016-05-25 NOTE — Telephone Encounter (Signed)
Patient left VM on CHF clinic triage line Friday requesting refill on clonadine. Rx sent to preferred pharmacy electronically.  Renee Pain, RN

## 2016-06-05 ENCOUNTER — Other Ambulatory Visit (HOSPITAL_COMMUNITY): Payer: Self-pay | Admitting: Cardiology

## 2016-06-05 ENCOUNTER — Other Ambulatory Visit (HOSPITAL_COMMUNITY): Payer: Self-pay | Admitting: *Deleted

## 2016-06-05 MED ORDER — NIFEDIPINE ER OSMOTIC RELEASE 60 MG PO TB24: 120.0000 mg | ORAL_TABLET | Freq: Every morning | ORAL | 1 refills | Status: DC

## 2016-06-08 ENCOUNTER — Ambulatory Visit (HOSPITAL_COMMUNITY)
Admission: RE | Admit: 2016-06-08 | Discharge: 2016-06-08 | Disposition: A | Discharge status: Home / Self Care | Payer: Medicare Other | Source: Ambulatory Visit | Attending: Nephrology | Admitting: Nephrology

## 2016-06-08 DIAGNOSIS — N183 Chronic kidney disease, stage 3 unspecified: Secondary | ICD-10-CM

## 2016-06-08 LAB — GLUCOSE, CAPILLARY: Glucose-Capillary: 161 mg/dL — ABNORMAL HIGH (ref 65–99)

## 2016-06-08 LAB — POCT HEMOGLOBIN-HEMACUE: Hemoglobin: 10.1 g/dL — ABNORMAL LOW (ref 12.0–15.0)

## 2016-06-08 MED ORDER — FERUMOXYTOL INJECTION 510 MG/17 ML
510.0000 mg | INTRAVENOUS | Status: DC
  Administered 2016-06-08: 11:00:00 510 mg via INTRAVENOUS
  Filled 2016-06-08: qty 17

## 2016-06-08 MED ORDER — DARBEPOETIN ALFA 60 MCG/0.3ML IJ SOSY
PREFILLED_SYRINGE | INTRAMUSCULAR | Status: AC
  Filled 2016-06-08: qty 0.3

## 2016-06-08 MED ORDER — DARBEPOETIN ALFA 60 MCG/0.3ML IJ SOSY
60.0000 ug | PREFILLED_SYRINGE | INTRAMUSCULAR | Status: DC
  Administered 2016-06-08: 11:00:00 60 ug via SUBCUTANEOUS

## 2016-06-08 NOTE — Progress Notes (Signed)
Fereheme infusion completed. After infusion done pt states she is slightly dizzy and thought her blood sugar might be dropping. Requested a Sprite.  CBG done and sugar 161.  BP has not changed from admission at 149/47.  Will monitor as needed.    After 10 minutes pt states is feeling better. No other complaints. Daughter with pt.

## 2016-06-08 NOTE — Discharge Instructions (Signed)
daDarbepoetin Alfa injection What is this medicine? DARBEPOETIN ALFA (dar be POE e tin AL fa) helps your body make more red blood cells. It is used to treat anemia caused by chronic kidney failure and chemotherapy. This medicine may be used for other purposes; ask your health care provider or pharmacist if you have questions. COMMON BRAND NAME(S): Aranesp What should I tell my health care provider before I take this medicine? They need to know if you have any of these conditions: -blood clotting disorders or history of blood clots -cancer patient not on chemotherapy -cystic fibrosis -heart disease, such as angina, heart failure, or a history of a heart attack -hemoglobin level of 12 g/dL or greater -high blood pressure -low levels of folate, iron, or vitamin B12 -seizures -an unusual or allergic reaction to darbepoetin, erythropoietin, albumin, hamster proteins, latex, other medicines, foods, dyes, or preservatives -pregnant or trying to get pregnant -breast-feeding How should I use this medicine? This medicine is for injection into a vein or under the skin. It is usually given by a health care professional in a hospital or clinic setting. If you get this medicine at home, you will be taught how to prepare and give this medicine. Use exactly as directed. Take your medicine at regular intervals. Do not take your medicine more often than directed. It is important that you put your used needles and syringes in a special sharps container. Do not put them in a trash can. If you do not have a sharps container, call your pharmacist or healthcare provider to get one. A special MedGuide will be given to you by the pharmacist with each prescription and refill. Be sure to read this information carefully each time. Talk to your pediatrician regarding the use of this medicine in children. While this medicine may be used in children as young as 1 year for selected conditions, precautions do  apply. Overdosage: If you think you have taken too much of this medicine contact a poison control center or emergency room at once. NOTE: This medicine is only for you. Do not share this medicine with others. What if I miss a dose? If you miss a dose, take it as soon as you can. If it is almost time for your next dose, take only that dose. Do not take double or extra doses. What may interact with this medicine? Do not take this medicine with any of the following medications: -epoetin alfa This list may not describe all possible interactions. Give your health care provider a list of all the medicines, herbs, non-prescription drugs, or dietary supplements you use. Also tell them if you smoke, drink alcohol, or use illegal drugs. Some items may interact with your medicine. What should I watch for while using this medicine? Your condition will be monitored carefully while you are receiving this medicine. You may need blood work done while you are taking this medicine. What side effects may I notice from receiving this medicine? Side effects that you should report to your doctor or health care professional as soon as possible: -allergic reactions like skin rash, itching or hives, swelling of the face, lips, or tongue -breathing problems -changes in vision -chest pain -confusion, trouble speaking or understanding -feeling faint or lightheaded, falls -high blood pressure -muscle aches or pains -pain, swelling, warmth in the leg -rapid weight gain -severe headaches -sudden numbness or weakness of the face, arm or leg -trouble walking, dizziness, loss of balance or coordination -seizures (convulsions) -swelling of the ankles, feet, hands -  unusually weak or tired Side effects that usually do not require medical attention (report to your doctor or health care professional if they continue or are bothersome): -diarrhea -fever, chills (flu-like symptoms) -headaches -nausea, vomiting -redness,  stinging, or swelling at site where injected This list may not describe all possible side effects. Call your doctor for medical advice about side effects. You may report side effects to FDA at 1-800-FDA-1088. Where should I keep my medicine? Keep out of the reach of children. Store in a refrigerator between 2 and 8 degrees C (36 and 46 degrees F). Do not freeze. Do not shake. Throw away any unused portion if using a single-dose vial. Throw away any unused medicine after the expiration date. NOTE: This sheet is a summary. It may not cover all possible information. If you have questions about this medicine, talk to your doctor, pharmacist, or health care provider.  2017 Elsevier/Gold Standard (2015-12-02 19:52:26) Ferumoxytol injection What is this medicine? FERUMOXYTOL is an iron complex. Iron is used to make healthy red blood cells, which carry oxygen and nutrients throughout the body. This medicine is used to treat iron deficiency anemia in people with chronic kidney disease. COMMON BRAND NAME(S): Feraheme What should I tell my health care provider before I take this medicine? They need to know if you have any of these conditions: -anemia not caused by low iron levels -high levels of iron in the blood -magnetic resonance imaging (MRI) test scheduled -an unusual or allergic reaction to iron, other medicines, foods, dyes, or preservatives -pregnant or trying to get pregnant -breast-feeding How should I use this medicine? This medicine is for injection into a vein. It is given by a health care professional in a hospital or clinic setting. Talk to your pediatrician regarding the use of this medicine in children. Special care may be needed. What if I miss a dose? It is important not to miss your dose. Call your doctor or health care professional if you are unable to keep an appointment. What may interact with this medicine? This medicine may interact with the following medications: -other iron  products What should I watch for while using this medicine? Visit your doctor or healthcare professional regularly. Tell your doctor or healthcare professional if your symptoms do not start to get better or if they get worse. You may need blood work done while you are taking this medicine. You may need to follow a special diet. Talk to your doctor. Foods that contain iron include: whole grains/cereals, dried fruits, beans, or peas, leafy green vegetables, and organ meats (liver, kidney). What side effects may I notice from receiving this medicine? Side effects that you should report to your doctor or health care professional as soon as possible: -allergic reactions like skin rash, itching or hives, swelling of the face, lips, or tongue -breathing problems -changes in blood pressure -feeling faint or lightheaded, falls -fever or chills -flushing, sweating, or hot feelings -swelling of the ankles or feet Side effects that usually do not require medical attention (report to your doctor or health care professional if they continue or are bothersome): -diarrhea -headache -nausea, vomiting -stomach pain Where should I keep my medicine? This drug is given in a hospital or clinic and will not be stored at home.  2017 Elsevier/Gold Standard (2015-05-16 12:41:49)

## 2016-06-09 ENCOUNTER — Other Ambulatory Visit (HOSPITAL_COMMUNITY): Payer: Self-pay | Admitting: Internal Medicine

## 2016-06-09 NOTE — Telephone Encounter (Signed)
Age 83 Wt 95.8kg (04/07/2016) Saw Dr Aundra Dubin on 04/07/2016  05/19/2016 Hgb 7.7 Hct 25.3  Had Feraheme  Infusion on 06/08/2016  And Hgb  is being followed by [facility] Hgb 10.1 06/08/2016  SrCr 2.43 (05/20/2015 ) Refill done for Eliquis 2.5mg  bid

## 2016-06-10 ENCOUNTER — Other Ambulatory Visit: Payer: Self-pay | Admitting: *Deleted

## 2016-06-13 ENCOUNTER — Other Ambulatory Visit (HOSPITAL_COMMUNITY): Payer: Self-pay | Admitting: Student

## 2016-06-13 DIAGNOSIS — I10 Essential (primary) hypertension: Secondary | ICD-10-CM

## 2016-06-16 ENCOUNTER — Telehealth: Payer: Self-pay | Admitting: Family Medicine

## 2016-06-16 NOTE — Telephone Encounter (Signed)
Benjamine Mola - [facility] - [phone]  Called in to make provider aware that she is completing a agency discharge for this pt

## 2016-06-16 NOTE — Telephone Encounter (Signed)
DO THEY HAVE TRANSPORTATION TO GET HERE WHEN NEEDED?   IS SHE REALLY READY FOR D/C --- WE HAVE NOT SEEN HER IN A WHILE BECAUSE THEY CAN NOT GET HERE

## 2016-06-19 ENCOUNTER — Ambulatory Visit (HOSPITAL_COMMUNITY)
Admission: RE | Admit: 2016-06-19 | Discharge: 2016-06-19 | Disposition: A | Discharge status: Home / Self Care | Payer: Medicare Other | Source: Ambulatory Visit | Attending: Nephrology | Admitting: Nephrology

## 2016-06-19 DIAGNOSIS — D631 Anemia in chronic kidney disease: Secondary | ICD-10-CM | POA: Diagnosis not present

## 2016-06-19 MED ORDER — SODIUM CHLORIDE 0.9 % IV SOLN
510.0000 mg | INTRAVENOUS | Status: AC
  Administered 2016-06-19: 510 mg via INTRAVENOUS
  Filled 2016-06-19: qty 17

## 2016-06-19 NOTE — Telephone Encounter (Signed)
Spoke to [facility] From the RN's understanding she has completed healing of the ulcer. As far as they know they have transportation when needed once a week iron transfusion.

## 2016-06-19 NOTE — Telephone Encounter (Signed)
noted 

## 2016-07-02 ENCOUNTER — Other Ambulatory Visit: Payer: Self-pay | Admitting: Family Medicine

## 2016-07-09 ENCOUNTER — Encounter (HOSPITAL_COMMUNITY): Payer: Self-pay

## 2016-07-09 ENCOUNTER — Encounter (HOSPITAL_COMMUNITY)
Admission: RE | Admit: 2016-07-09 | Discharge: 2016-07-09 | Disposition: A | Discharge status: Home / Self Care | Payer: Medicare Other | Source: Ambulatory Visit | Attending: Nephrology | Admitting: Nephrology

## 2016-07-09 ENCOUNTER — Encounter (HOSPITAL_COMMUNITY): Payer: Medicare Other

## 2016-07-09 ENCOUNTER — Ambulatory Visit (HOSPITAL_COMMUNITY)
Admission: RE | Admit: 2016-07-09 | Discharge: 2016-07-09 | Disposition: A | Discharge status: Home / Self Care | Payer: Medicare Other | Source: Ambulatory Visit | Attending: Cardiology | Admitting: Cardiology

## 2016-07-09 VITALS — BP 142/62 | HR 64 | Wt 213.0 lb

## 2016-07-09 DIAGNOSIS — I480 Paroxysmal atrial fibrillation: Secondary | ICD-10-CM | POA: Insufficient documentation

## 2016-07-09 DIAGNOSIS — Z79891 Long term (current) use of opiate analgesic: Secondary | ICD-10-CM | POA: Diagnosis not present

## 2016-07-09 DIAGNOSIS — Z6834 Body mass index (BMI) 34.0-34.9, adult: Secondary | ICD-10-CM | POA: Diagnosis not present

## 2016-07-09 DIAGNOSIS — Z87891 Personal history of nicotine dependence: Secondary | ICD-10-CM | POA: Diagnosis not present

## 2016-07-09 DIAGNOSIS — E785 Hyperlipidemia, unspecified: Secondary | ICD-10-CM | POA: Insufficient documentation

## 2016-07-09 DIAGNOSIS — N183 Chronic kidney disease, stage 3 unspecified: Secondary | ICD-10-CM

## 2016-07-09 DIAGNOSIS — E7800 Pure hypercholesterolemia, unspecified: Secondary | ICD-10-CM

## 2016-07-09 DIAGNOSIS — Z8489 Family history of other specified conditions: Secondary | ICD-10-CM | POA: Diagnosis not present

## 2016-07-09 DIAGNOSIS — I5032 Chronic diastolic (congestive) heart failure: Secondary | ICD-10-CM | POA: Insufficient documentation

## 2016-07-09 DIAGNOSIS — Z794 Long term (current) use of insulin: Secondary | ICD-10-CM | POA: Diagnosis not present

## 2016-07-09 DIAGNOSIS — Z0001 Encounter for general adult medical examination with abnormal findings: Secondary | ICD-10-CM | POA: Insufficient documentation

## 2016-07-09 DIAGNOSIS — N184 Chronic kidney disease, stage 4 (severe): Secondary | ICD-10-CM | POA: Diagnosis not present

## 2016-07-09 DIAGNOSIS — I130 Hypertensive heart and chronic kidney disease with heart failure and stage 1 through stage 4 chronic kidney disease, or unspecified chronic kidney disease: Secondary | ICD-10-CM | POA: Insufficient documentation

## 2016-07-09 DIAGNOSIS — Z79899 Other long term (current) drug therapy: Secondary | ICD-10-CM | POA: Insufficient documentation

## 2016-07-09 DIAGNOSIS — E1122 Type 2 diabetes mellitus with diabetic chronic kidney disease: Secondary | ICD-10-CM | POA: Diagnosis not present

## 2016-07-09 DIAGNOSIS — Z833 Family history of diabetes mellitus: Secondary | ICD-10-CM | POA: Insufficient documentation

## 2016-07-09 DIAGNOSIS — R079 Chest pain, unspecified: Secondary | ICD-10-CM | POA: Insufficient documentation

## 2016-07-09 DIAGNOSIS — M545 Low back pain: Secondary | ICD-10-CM | POA: Diagnosis not present

## 2016-07-09 DIAGNOSIS — E669 Obesity, unspecified: Secondary | ICD-10-CM | POA: Insufficient documentation

## 2016-07-09 LAB — RENAL FUNCTION PANEL
ALBUMIN: 3.4 g/dL — AB (ref 3.5–5.0)
Anion gap: 10 (ref 5–15)
BUN: 46 mg/dL — ABNORMAL HIGH (ref 6–20)
CALCIUM: 10.3 mg/dL (ref 8.9–10.3)
CHLORIDE: 104 mmol/L (ref 101–111)
CO2: 24 mmol/L (ref 22–32)
Creatinine, Ser: 1.68 mg/dL — ABNORMAL HIGH (ref 0.44–1.00)
GFR calc Af Amer: 31 mL/min — ABNORMAL LOW (ref >60)
GFR calc non Af Amer: 27 mL/min — ABNORMAL LOW (ref >60)
GLUCOSE: 175 mg/dL — AB (ref 65–99)
PHOSPHORUS: 2.8 mg/dL (ref 2.5–4.6)
POTASSIUM: 3.5 mmol/L (ref 3.5–5.1)
Sodium: 138 mmol/L (ref 135–145)

## 2016-07-09 LAB — LIPID PANEL
CHOLESTEROL: 180 mg/dL (ref 0–200)
HDL: 37 mg/dL — ABNORMAL LOW (ref >40)
LDL Cholesterol: 64 mg/dL (ref 0–99)
TRIGLYCERIDES: 396 mg/dL — AB (ref <150)
Total CHOL/HDL Ratio: 4.9 RATIO
VLDL: 79 mg/dL — AB (ref 0–40)

## 2016-07-09 LAB — IRON AND TIBC
Iron: 94 ug/dL (ref 28–170)
SATURATION RATIOS: 25 % (ref 10.4–31.8)
TIBC: 377 ug/dL (ref 250–450)
UIBC: 283 ug/dL

## 2016-07-09 LAB — CBC
HCT: 34.7 % — ABNORMAL LOW (ref 36.0–46.0)
HEMOGLOBIN: 11.1 g/dL — AB (ref 12.0–15.0)
MCH: 28.8 pg (ref 26.0–34.0)
MCHC: 32 g/dL (ref 30.0–36.0)
MCV: 89.9 fL (ref 78.0–100.0)
Platelets: 381 10*3/uL (ref 150–400)
RBC: 3.86 MIL/uL — ABNORMAL LOW (ref 3.87–5.11)
RDW: 16.1 % — AB (ref 11.5–15.5)
WBC: 9.6 10*3/uL (ref 4.0–10.5)

## 2016-07-09 LAB — FERRITIN: FERRITIN: 268 ng/mL (ref 11–307)

## 2016-07-09 MED ORDER — DARBEPOETIN ALFA 60 MCG/0.3ML IJ SOSY
PREFILLED_SYRINGE | INTRAMUSCULAR | Status: AC
  Filled 2016-07-09: qty 0.3

## 2016-07-09 MED ORDER — DARBEPOETIN ALFA 60 MCG/0.3ML IJ SOSY
60.0000 ug | PREFILLED_SYRINGE | INTRAMUSCULAR | Status: DC
  Administered 2016-07-09: 11:00:00 60 ug via SUBCUTANEOUS

## 2016-07-09 NOTE — Patient Instructions (Signed)
Labs today  We will contact you in 6 months to schedule your next appointment.  

## 2016-07-10 ENCOUNTER — Telehealth (HOSPITAL_COMMUNITY): Payer: Self-pay | Admitting: *Deleted

## 2016-07-10 ENCOUNTER — Encounter (HOSPITAL_COMMUNITY): Payer: Medicare Other

## 2016-07-10 DIAGNOSIS — I5032 Chronic diastolic (congestive) heart failure: Secondary | ICD-10-CM

## 2016-07-10 LAB — POCT HEMOGLOBIN-HEMACUE: HEMOGLOBIN: 11.1 g/dL — AB (ref 12.0–15.0)

## 2016-07-10 LAB — PTH, INTACT AND CALCIUM
Calcium, Total (PTH): 10.3 mg/dL (ref 8.7–10.3)
PTH: 18 pg/mL (ref 15–65)

## 2016-07-10 MED ORDER — CLONIDINE HCL 0.3 MG PO TABS: ORAL_TABLET | ORAL | 1 refills | Status: DC

## 2016-07-10 MED ORDER — CLONIDINE HCL 0.2 MG PO TABS: 0.4000 mg | ORAL_TABLET | Freq: Three times a day (TID) | ORAL | 3 refills | Status: DC

## 2016-07-10 MED ORDER — FENOFIBRATE 48 MG PO TABS: 48.0000 mg | ORAL_TABLET | Freq: Every day | ORAL | 3 refills | Status: DC

## 2016-07-10 MED ORDER — NEBIVOLOL HCL 5 MG PO TABS: 2.5000 mg | ORAL_TABLET | Freq: Every day | ORAL | 3 refills | Status: DC

## 2016-07-10 NOTE — Telephone Encounter (Signed)
-----   Message from Larey Dresser, MD sent at 07/09/2016 10:54 PM EDT ----- Very high triglycerides.  Would start fenofibrate 48 mg daily.

## 2016-07-10 NOTE — Progress Notes (Signed)
ADVANCED HF CLINIC NOTE  Patient ID: Shelia Woods, female   DOB: [birth year], 83 y.o.   MRN: [id number] PCP: Dr. Shelda Altes Cardiology: Dr. Aundra Dubin  Ms. Harnack is an 83 y/o woman with h/o obesity, HTN, CKD stage 3-4, PAF, diabetes.    She had a nuclear stress test in 2007 that showed EF 69% and no ischemia. She has never has a cath. Renal dopplers in 2009 showed no RAS.   She does not ambulate much due to her severe back pain and uses a walker or wheelchair.   Has a h/o PAF. Event monitor in 10/16 showed 1 run of atrial fibrillation.  She is on apixaban 2.5 mg bid.   Sleeps in chair due to her back. No chest pain. She is tolerating Eliquis without overt bleeding.  She remains very limited by back pain.  She walks with walker, no dyspnea walking around house but short of breath with longer distances like to the car.  Weight is up 2 lbs.  BP is mildly elevated today in the office, but she checks it at home frequently and it is always < 599 systolic.    Labs (10/16): LDL 89, K 4.1, creatinine 1.71, hgb 11.3 Labs (2/17): K 3.7, creatinine 1.58, LDL 93, TGs 285 Labs (8/17): K 4.4, creatinine 2.33 Labs (9/17): K 3.7, creatinine 2.04 Labs (10/17): K 3.6, creatinine 2.01, hgb 10.3 Labs (3/18): K 3.5, creatinine 1.68  PMH: 1. Obesity 2. HTN: Renal artery dopplers (2009) with no renal artery stenosis. 3. CKD III-IV 4. Type II diabetes 5. Atrial fibrillation: Paroxysmal.  Event monitor (10/16) showed average HR 53 with 1 brief run of atrial fibrillation (controlled rate).  - Holter (11/17): rare PACs and PVCs.   6. Cardiolite (2007) with EF 69%, no ischemia.  7. Hyperlipidemia 8. Chronic diastolic CHF: Echo (35/70) with EF 60-65%, mild LVH.  Echo (11/16) with EF 60-65%, moderate LVH.  9. Low back pain: Severe and debilitating.  10. Anemia of chronic disease/renal disease  ROS: All systems negative except as listed in HPI, PMH and Problem List.  Social History   Social History  . Marital  status: Married    Spouse name: N/A  . Number of children: 4  . Years of education: N/A   Occupational History  .  Retired    retired/disabled (1994)   Social History Main Topics  . Smoking status: Former Smoker    Quit date: 04/27/1978  . Smokeless tobacco: Never Used  . Alcohol use No  . Drug use: No  . Sexual activity: Not on file   Other Topics Concern  . Not on file   Social History Narrative   Four children   Regular exercise-no    Family History  Problem Relation Age of Onset  . Diabetes Sister   . Parkinsonism Sister     Current Outpatient Prescriptions  Medication Sig Dispense Refill  . acetaminophen (TYLENOL) 500 MG tablet Take 1,000 mg by mouth 3 (three) times daily.     Marland Kitchen atorvastatin (LIPITOR) 40 MG tablet TAKE 1 TABLET(40 MG) BY MOUTH AT BEDTIME 90 tablet 1  . Blood Glucose Monitoring Suppl (ONE TOUCH ULTRA SYSTEM KIT) W/DEVICE KIT 1 kit by Does not apply route once. E11.9 1 each 0  . bumetanide (BUMEX) 1 MG tablet Take 1 mg by mouth 2 (two) times daily.    . calcium carbonate (CALCIUM 600) 600 MG TABS tablet Take 1 tablet (600 mg total) by mouth every evening. 180 tablet 1  .  cloNIDine (CATAPRES) 0.3 MG tablet Take 1.5 tablets (0.45 mg total) by mouth 3 (three) times daily. 270 tablet 1  . ELIQUIS 2.5 MG TABS tablet TAKE 1 TABLET BY MOUTH TWICE DAILY 60 tablet 5  . fluocinonide cream (LIDEX) 0.05 % APPLY EXTERNALLY TO THE AFFECTED AREA TWICE DAILY 120 g 0  . Glucosamine-Chondroitin (OSTEO BI-FLEX REGULAR STRENGTH) 250-200 MG TABS Take 1 tablet by mouth 2 (two) times daily.     . hydrALAZINE (APRESOLINE) 100 MG tablet Take 1 tablet (100 mg total) by mouth 3 (three) times daily. 90 tablet 6  . insulin NPH Human (HUMULIN N,NOVOLIN N) 100 UNIT/ML injection Inject 0.3 mLs (30 Units total) into the skin at bedtime. 3 vial 3  . insulin regular (NOVOLIN R,HUMULIN R) 100 units/mL injection 15 units in the morning, 15 units midday and 20 units in the evening 10 mL 2  .  isosorbide mononitrate (IMDUR) 30 MG 24 hr tablet TAKE 1 TABLET BY MOUTH DAILY 90 tablet 3  . loratadine (CLARITIN) 10 MG tablet Take 10 mg by mouth daily as needed for allergies.     Marland Kitchen losartan (COZAAR) 100 MG tablet TAKE 1 TABLET(100 MG) BY MOUTH DAILY 30 tablet 11  . Melatonin 1 MG TABS Take 1 mg by mouth at bedtime.    . Multiple Vitamins-Minerals (PRESERVISION AREDS 2 PO) Take 1 capsule by mouth 2 (two) times daily.     . nebivolol (BYSTOLIC) 5 MG tablet Take 0.5 tablets (2.5 mg total) by mouth daily. 30 tablet 3  . NIFEdipine (PROCARDIA XL/ADALAT-CC) 60 MG 24 hr tablet Take 2 tablets (120 mg total) by mouth every morning. 180 tablet 1  . ONE TOUCH ULTRA TEST test strip TEST TWICE DAILY AS DIRECTED 100 each 0  . ONETOUCH DELICA LANCETS 76P MISC Use one lancet each time sugars are checked. Pt tests twice daily. 100 each 12  . ONETOUCH DELICA LANCETS FINE MISC USE TWICE DAILY 100 each 5  . polyethylene glycol (MIRALAX / GLYCOLAX) packet Take 17 g by mouth 2 (two) times daily as needed for mild constipation.    . traMADol (ULTRAM) 50 MG tablet TAKE 1/2-1 TABLET BY MOUTH EVERY 6 HOURS AS NEEDED FOR MODERATE PAIN 60 tablet 0   No current facility-administered medications for this encounter.    Facility-Administered Medications Ordered in Other Encounters  Medication Dose Route Frequency Provider Last Rate Last Dose  . Darbepoetin Alfa (ARANESP) injection 60 mcg  60 mcg Subcutaneous Q28 days Rexene Agent, MD   60 mcg at 07/09/16 1108    Vitals:   07/09/16 1139  BP: (!) 142/62  Pulse: 64  SpO2: 98%  Weight: 213 lb (96.6 kg)    PHYSICAL EXAM:  General:  Elderly woman sitting in WC. No resp difficulty HEENT: normal Neck: Thick. JVP not elevated. Carotids 2+ bilaterally; no bruits. No lymphadenopathy or thryomegaly appreciated. Cor: PMI normal. Regular rate & rhythm. 2/6 early SEM RUSB.  Lungs: clear Abdomen: soft, obese nontender, nondistended. No hepatosplenomegaly. No bruits or  masses. Good bowel sounds. Extremities: no cyanosis, clubbing, rash.  1+ right > left ankle edema (chronic asymmetry).     Neuro: alert & orientedx3, cranial nerves grossly intact. Moves all 4 extremities w/o difficulty. Affect pleasant.  ASSESSMENT & PLAN: 1. Chest pain: No recent chest pain. She has multiple RFs for CAD, so it is certainly possible that her prior chest tightness was due to angina.  We had wanted her to have a Cardiolite in 2016 but she  cancelled the test.  At this point, with no chest pain, I think we could hold off on the stress test.  Would need very high threshold for coronary angiography given CKD.  - Continue Imdur 30 daily, no aspirin due to apixaban use, continue statin.  2. Atrial fibrillation: Paroxysmal.  NSR today.  Continue apixaban (reduced dose given age and creatinine).  Check CBC with apixaban use. 3. Hyperlipidemia: Continue statin, check lipids today.  4. HTN: BP now controlled.  - She has been able to tolerate nebivolol.   - Continue hydralazine, clonidine, and losartan at current doses. 5. Chronic diastolic CHF: Volume status looks ok.   - Continue bumetanide 1 mg bid.   6. CKD: Stage III. Sees Dr Joelyn Oms.   Followup in 6 months  Loralie Champagne 07/10/2016

## 2016-07-10 NOTE — Telephone Encounter (Signed)
Pt aware of results and med change. ------  Notes recorded by Larey Dresser, MD on 07/09/2016 at 10:54 PM EDT Very high triglycerides. Would start fenofibrate 48 mg daily.    Ref Range & Units 1d ago (07/09/16) 85yr ago (06/20/15) 87yr ago (02/13/15)   Cholesterol 0 - 200 mg/dL 180  185CM  164CM    Triglycerides <150 mg/dL 396   285.0R, CM   258.0R, CM     HDL >40 mg/dL 37   47.10R  45.10R    Total CHOL/HDL Ratio RATIO 4.9  4R, CM  4R, CM    VLDL 0 - 40 mg/dL 79   57.0R   51.6R     LDL Cholesterol 0 - 99 mg/dL 64

## 2016-07-12 ENCOUNTER — Other Ambulatory Visit: Payer: Self-pay | Admitting: Family Medicine

## 2016-07-15 ENCOUNTER — Telehealth (HOSPITAL_COMMUNITY): Payer: Self-pay | Admitting: *Deleted

## 2016-07-15 NOTE — Telephone Encounter (Signed)
Patient called in complaining of feet, leg, and facial swelling that has progressed since 3/15 after her second injection of Aranesp given by Dr. Joelyn Oms.  She also complained of having headaches and feeling very light headed.  While I was on the phone with patient I had her check her weight and her weight was 204 lbs today.  At her visit with [place] on 3/15 her weight in our clinic was 213 lbs.    From listening to all of her symptoms and discussing them with Oda Kilts, PA and Dr. Loralie Champagne it seems that her symptoms are not heart related.  Dr. Aundra Dubin advises patient to contact Dr. Joelyn Oms or his nurse about symptoms.    Patent understands and verbalizes that she will contact Dr. Adin Hector office now.  I also advised her that if her symptoms worsen to go to the Emergency Room.  No further questions at this time.

## 2016-08-03 ENCOUNTER — Other Ambulatory Visit: Payer: Self-pay | Admitting: Family Medicine

## 2016-08-07 ENCOUNTER — Encounter (HOSPITAL_COMMUNITY): Payer: Medicare Other

## 2016-08-20 ENCOUNTER — Telehealth: Payer: Self-pay | Admitting: General Practice

## 2016-08-20 NOTE — Telephone Encounter (Signed)
Pt no longer sees Dr. Lowne for PCP °

## 2016-09-01 ENCOUNTER — Other Ambulatory Visit: Payer: Self-pay | Admitting: Family Medicine

## 2016-09-04 ENCOUNTER — Other Ambulatory Visit (HOSPITAL_COMMUNITY): Payer: Self-pay | Admitting: Cardiology

## 2016-09-04 MED ORDER — BUMETANIDE 1 MG PO TABS: 1.0000 mg | ORAL_TABLET | Freq: Two times a day (BID) | ORAL | 3 refills | Status: DC

## 2016-09-07 ENCOUNTER — Other Ambulatory Visit: Payer: Self-pay | Admitting: Family Medicine

## 2016-09-22 ENCOUNTER — Telehealth (HOSPITAL_COMMUNITY): Payer: Self-pay | Admitting: *Deleted

## 2016-09-22 MED ORDER — BUMETANIDE 1 MG PO TABS: ORAL_TABLET | ORAL | 3 refills | Status: DC

## 2016-09-22 NOTE — Telephone Encounter (Signed)
Patient called in saying she has increased swelling in her legs and feet for 2-3 weeks now.  She also reported that she weighted herself today and she was 226 lbs.  Her baseline weight is 211-212 lbs.    I spoke with Dr. Aundra Dubin and he advises her to increase BUMEX to 1.5 mg (1.5 tablets) in the AM and 1 mg (1 Tablet) in the PM. If she doesn't see a difference in her swelling and weight have her call us back on Friday.  Patient is agreeable with plan and medication list has been updated. No further questions at this time.

## 2016-09-28 NOTE — Telephone Encounter (Signed)
Patient called back today and stated that she has not seen any relief in her swelling and weight gain.  I spoke with Dr. Aundra Dubin and wants patient to come into clinic this week and see one of our NPs.  I have scheduled patient on Thursday this week per Darrick Grinder, NP. Patient is aware and no further questions as this time.

## 2016-10-01 ENCOUNTER — Telehealth (HOSPITAL_COMMUNITY): Payer: Self-pay | Admitting: Surgery

## 2016-10-01 ENCOUNTER — Ambulatory Visit (HOSPITAL_COMMUNITY)
Admission: RE | Admit: 2016-10-01 | Discharge: 2016-10-01 | Disposition: A | Discharge status: Home / Self Care | Payer: Medicare Other | Source: Ambulatory Visit | Attending: Internal Medicine | Admitting: Internal Medicine

## 2016-10-01 VITALS — BP 164/102 | HR 73 | Wt 229.0 lb

## 2016-10-01 DIAGNOSIS — I10 Essential (primary) hypertension: Secondary | ICD-10-CM

## 2016-10-01 DIAGNOSIS — Z794 Long term (current) use of insulin: Secondary | ICD-10-CM | POA: Diagnosis not present

## 2016-10-01 DIAGNOSIS — Z87891 Personal history of nicotine dependence: Secondary | ICD-10-CM | POA: Diagnosis not present

## 2016-10-01 DIAGNOSIS — Z7901 Long term (current) use of anticoagulants: Secondary | ICD-10-CM | POA: Insufficient documentation

## 2016-10-01 DIAGNOSIS — E785 Hyperlipidemia, unspecified: Secondary | ICD-10-CM | POA: Insufficient documentation

## 2016-10-01 DIAGNOSIS — N184 Chronic kidney disease, stage 4 (severe): Secondary | ICD-10-CM | POA: Insufficient documentation

## 2016-10-01 DIAGNOSIS — I5032 Chronic diastolic (congestive) heart failure: Secondary | ICD-10-CM

## 2016-10-01 DIAGNOSIS — I130 Hypertensive heart and chronic kidney disease with heart failure and stage 1 through stage 4 chronic kidney disease, or unspecified chronic kidney disease: Secondary | ICD-10-CM | POA: Diagnosis not present

## 2016-10-01 DIAGNOSIS — N183 Chronic kidney disease, stage 3 unspecified: Secondary | ICD-10-CM

## 2016-10-01 DIAGNOSIS — E7800 Pure hypercholesterolemia, unspecified: Secondary | ICD-10-CM | POA: Diagnosis not present

## 2016-10-01 DIAGNOSIS — E669 Obesity, unspecified: Secondary | ICD-10-CM | POA: Insufficient documentation

## 2016-10-01 DIAGNOSIS — I480 Paroxysmal atrial fibrillation: Secondary | ICD-10-CM

## 2016-10-01 DIAGNOSIS — I5033 Acute on chronic diastolic (congestive) heart failure: Secondary | ICD-10-CM

## 2016-10-01 DIAGNOSIS — E1122 Type 2 diabetes mellitus with diabetic chronic kidney disease: Secondary | ICD-10-CM | POA: Insufficient documentation

## 2016-10-01 LAB — BASIC METABOLIC PANEL
Anion gap: 10 (ref 5–15)
BUN: 23 mg/dL — AB (ref 6–20)
CHLORIDE: 106 mmol/L (ref 101–111)
CO2: 23 mmol/L (ref 22–32)
Calcium: 9.3 mg/dL (ref 8.9–10.3)
Creatinine, Ser: 1.99 mg/dL — ABNORMAL HIGH (ref 0.44–1.00)
GFR calc Af Amer: 25 mL/min — ABNORMAL LOW (ref >60)
GFR calc non Af Amer: 22 mL/min — ABNORMAL LOW (ref >60)
Glucose, Bld: 78 mg/dL (ref 65–99)
Potassium: 3.5 mmol/L (ref 3.5–5.1)
SODIUM: 139 mmol/L (ref 135–145)

## 2016-10-01 LAB — BRAIN NATRIURETIC PEPTIDE: B NATRIURETIC PEPTIDE 5: 257.6 pg/mL — AB (ref 0.0–100.0)

## 2016-10-01 MED ORDER — FUROSEMIDE 10 MG/ML IJ SOLN
80.0000 mg | Freq: Once | INTRAMUSCULAR | Status: AC
  Administered 2016-10-01: 80 mg via INTRAVENOUS
  Filled 2016-10-01: qty 8

## 2016-10-01 MED ORDER — POTASSIUM CHLORIDE CRYS ER 20 MEQ PO TBCR
40.0000 meq | EXTENDED_RELEASE_TABLET | Freq: Once | ORAL | Status: AC
  Administered 2016-10-01: 40 meq via ORAL
  Filled 2016-10-01: qty 2

## 2016-10-01 MED ORDER — BUMETANIDE 1 MG PO TABS: 2.0000 mg | ORAL_TABLET | Freq: Two times a day (BID) | ORAL | 3 refills | Status: DC

## 2016-10-01 NOTE — Patient Instructions (Signed)
INCREASE Bumex to 2 mg (2 Tablet) Two Times Daily  Please keep IV site dry and intact until you return to clinic tomorrow @ 10:00 AM  Follow up tomorrow June 8th @ 10:00 AM

## 2016-10-01 NOTE — Telephone Encounter (Signed)
Patient has been referred to the [facility] program.  I have sent all appropriate paperwork via secure email to the Paramedic team. 

## 2016-10-01 NOTE — Progress Notes (Signed)
Peripheral IV inserted- LAC per orders for IV lasix.

## 2016-10-01 NOTE — Progress Notes (Signed)
Advanced Heart Failure Medication Review by a Pharmacist  Does the patient  feel that his/her medications are working for him/her?  yes  Has the patient been experiencing any side effects to the medications prescribed?  no  Does the patient measure his/her own blood pressure or blood glucose at home?  yes   Does the patient have any problems obtaining medications due to transportation or finances?   no  Understanding of regimen: good Understanding of indications: good Potential of compliance: good Patient understands to avoid NSAIDs. Patient understands to avoid decongestants.  Issues to address at subsequent visits: bradycardia - has been running in low 50s    Pharmacist comments: Mrs. Shelia Woods is a 82 yo female presenting with husband and son for management of HF. Patient brought a med list from last outpatient visit with her and has a good understanding of indications and regimen. Patient denies ADE with recent increase in bumex, d/c chlorthalidone.     Time with patient: 10 mins Preparation and documentation time: 5 mins Total time: 15 mins  Carlean Jews, Pharm.D. PGY1 Pharmacy Resident 6/7/20182:23 PM Pager [phone]

## 2016-10-01 NOTE — Progress Notes (Signed)
ADVANCED HF CLINIC NOTE  Patient ID: Shelia Woods, female   DOB: [birth year], 83 y.o.   MRN: [id number] PCP: Dr. Shelda Altes Cardiology: Dr. Aundra Dubin  Shelia Woods is an 83 y/o woman with h/o obesity, HTN, CKD stage 3-4, PAF, diabetes and chronic diastolic CHF.   She had a nuclear stress test in 2007 that showed EF 69% and no ischemia. She has never has a cath. Renal dopplers in 2009 showed no RAS.   She does not ambulate much due to her severe back pain and uses a walker or wheelchair.   Has a h/o PAF. Event monitor in 10/16 showed 1 run of atrial fibrillation.  She is on apixaban 2.5 mg bid.   Shelia Woods presents today for an acute visit for weight gain and SOB. Weight is up about 16 pounds since last visit. SOB with walking, endorses orthopnea. Eating high sodium foods, drinking more than 2L a day. Her son is present at the visit and says that the patient is SOB at rest at times. She has marked BLE edema to her knees. Taking all medications, her Bumex was recently increased to 1.5 mg BID.   Labs (10/16): LDL 89, K 4.1, creatinine 1.71, hgb 11.3 Labs (2/17): K 3.7, creatinine 1.58, LDL 93, TGs 285 Labs (8/17): K 4.4, creatinine 2.33 Labs (9/17): K 3.7, creatinine 2.04 Labs (10/17): K 3.6, creatinine 2.01, hgb 10.3 Labs (3/18): K 3.5, creatinine 1.68  PMH: 1. Obesity 2. HTN: Renal artery dopplers (2009) with no renal artery stenosis. 3. CKD III-IV 4. Type II diabetes 5. Atrial fibrillation: Paroxysmal.  Event monitor (10/16) showed average HR 53 with 1 brief run of atrial fibrillation (controlled rate).  - Holter (11/17): rare PACs and PVCs.   6. Cardiolite (2007) with EF 69%, no ischemia.  7. Hyperlipidemia 8. Chronic diastolic CHF: Echo (73/53) with EF 60-65%, mild LVH.  Echo (11/16) with EF 60-65%, moderate LVH.  9. Low back pain: Severe and debilitating.  10. Anemia of chronic disease/renal disease  ROS: All systems negative except as listed in HPI, PMH and Problem List.  Social  History   Social History  . Marital status: Married    Spouse name: N/A  . Number of children: 4  . Years of education: N/A   Occupational History  .  Retired    retired/disabled (1994)   Social History Main Topics  . Smoking status: Former Smoker    Quit date: 04/27/1978  . Smokeless tobacco: Never Used  . Alcohol use No  . Drug use: No  . Sexual activity: Not on file   Other Topics Concern  . Not on file   Social History Narrative   Four children   Regular exercise-no    Family History  Problem Relation Age of Onset  . Diabetes Sister   . Parkinsonism Sister     Current Outpatient Prescriptions  Medication Sig Dispense Refill  . acetaminophen (TYLENOL) 500 MG tablet Take 1,000 mg by mouth 3 (three) times daily.     Marland Kitchen atorvastatin (LIPITOR) 40 MG tablet TAKE 1 TABLET(40 MG) BY MOUTH AT BEDTIME 90 tablet 1  . Blood Glucose Monitoring Suppl (ONE TOUCH ULTRA SYSTEM KIT) W/DEVICE KIT 1 kit by Does not apply route once. E11.9 1 each 0  . bumetanide (BUMEX) 1 MG tablet Take 1.5 mg (1.5 Tablets) in the AM and 1 mg (1 Tablet) in the PM. 75 tablet 3  . cloNIDine (CATAPRES) 0.3 MG tablet Take 1.5 tablets (0.45 mg total) by  mouth 3 (three) times daily. 270 tablet 1  . ELIQUIS 2.5 MG TABS tablet TAKE 1 TABLET BY MOUTH TWICE DAILY 60 tablet 5  . fenofibrate (TRICOR) 48 MG tablet Take 1 tablet (48 mg total) by mouth daily. 90 tablet 3  . Glucosamine-Chondroitin (OSTEO BI-FLEX REGULAR STRENGTH) 250-200 MG TABS Take 1 tablet by mouth 2 (two) times daily.     . hydrALAZINE (APRESOLINE) 100 MG tablet Take 1 tablet (100 mg total) by mouth 3 (three) times daily. 90 tablet 6  . insulin NPH Human (HUMULIN N,NOVOLIN N) 100 UNIT/ML injection Inject 0.3 mLs (30 Units total) into the skin at bedtime. 3 vial 3  . insulin regular (NOVOLIN R,HUMULIN R) 100 units/mL injection 15 units in the morning, 15 units midday and 20 units in the evening (Patient taking differently: 15 units in the morning, 15  units midday and 15 units in the evening) 10 mL 2  . isosorbide mononitrate (IMDUR) 30 MG 24 hr tablet TAKE 1 TABLET BY MOUTH DAILY 90 tablet 3  . loratadine (CLARITIN) 10 MG tablet Take 10 mg by mouth daily as needed for allergies.     Marland Kitchen losartan (COZAAR) 100 MG tablet TAKE 1 TABLET(100 MG) BY MOUTH DAILY 30 tablet 11  . Melatonin 1 MG TABS Take 1 mg by mouth at bedtime.    . Multiple Vitamins-Minerals (PRESERVISION AREDS 2 PO) Take 1 capsule by mouth 2 (two) times daily.     . nebivolol (BYSTOLIC) 5 MG tablet Take 0.5 tablets (2.5 mg total) by mouth daily. 30 tablet 3  . NIFEdipine (PROCARDIA XL/ADALAT-CC) 60 MG 24 hr tablet Take 2 tablets (120 mg total) by mouth every morning. 180 tablet 1  . ONE TOUCH ULTRA TEST test strip TEST TWICE DAILY AS DIRECTED 100 each 0  . ONETOUCH DELICA LANCETS 29U MISC Use one lancet each time sugars are checked. Pt tests twice daily. 100 each 12  . ONETOUCH DELICA LANCETS FINE MISC USE TWICE DAILY 100 each 11  . polyethylene glycol (MIRALAX / GLYCOLAX) packet Take 17 g by mouth 2 (two) times daily as needed for mild constipation.    . traMADol (ULTRAM) 50 MG tablet TAKE 1/2-1 TABLET BY MOUTH EVERY 6 HOURS AS NEEDED FOR MODERATE PAIN 60 tablet 0  . fluocinonide cream (LIDEX) 0.05 % APPLY EXTERNALLY TO THE AFFECTED AREA TWICE DAILY (Patient not taking: Reported on 10/01/2016) 120 g 0   No current facility-administered medications for this encounter.     Vitals:   10/01/16 1359  BP: (!) 164/102  Pulse: 73  SpO2: 96%  Weight: 229 lb (103.9 kg)    Filed Weights   10/01/16 1359  Weight: 229 lb (103.9 kg)   PHYSICAL EXAM: General:  Elderly female, arrived in wheelchair. Appears SOB and fatigued.  HEENT: normal Neck: Thick, JVP hard to assess, appears elevated.  Carotids 2+ bilaterally; no bruits. No lymphadenopathy or thryomegaly appreciated. Cor: PMI normal. Regular rate & rhythm. 2/6 SEM.  Lungs: Coarse crackles in bilateral lower lobes. Increased  respiratory effort.  Abdomen: soft, obese nontender, + distended. No hepatosplenomegaly. No bruits or masses.  Extremities: no cyanosis, clubbing, rash. 3+ BLE edema to knees.     Neuro: alert & orientedx3, cranial nerves grossly intact. Moves all 4 extremities w/o difficulty. Affect pleasant.  ASSESSMENT & PLAN: 1. Chest pain: No recent chest pain. She has multiple RFs for CAD, so it is certainly possible that her prior chest tightness was due to angina.   - Continue Imdur  30 daily, no aspirin due to apixaban use - Continue statin - Denies chest pain today.  2. Atrial fibrillation: Paroxysmal. -  EKG done today, and in NSR.  -  Continue apixaban (reduced dose given age and creatinine).   - Denies hematochezia and melena.  3. Hyperlipidemia:  - Continue statin - LDL 64 - Triglycerides 396, continue fenofibrate.  4. HTN:  - Well controlled on current regimen.  - She has been able to tolerate nebivolol.   - Continue hydralazine, clonidine, and losartan at current doses. 5. Acute on Chronic diastolic CHF:  - NYHA IV - Volume overloaded on exam, up about 15 pounds - She adamantly refuses hospitalization. Will give IV lasix 36m + 467m Kcl today and see her back tomorrow  - Low threshold for hospitalization. SOB at rest.  - Had good response to IV lasix in office. Will see her back tomorrow and reassess 6. CKD: Stage III. Sees Dr SaJoelyn Oms - BMET today   Greater than 50% of the (total minutes 120 minutes) visit spent in counseling/coordination of care regarding ( Coordinating care, giving IV lasix, education).   Shelia Woods/10/2016

## 2016-10-02 ENCOUNTER — Ambulatory Visit (HOSPITAL_COMMUNITY)
Admission: RE | Admit: 2016-10-02 | Discharge: 2016-10-02 | Disposition: A | Discharge status: Home / Self Care | Payer: Medicare Other | Source: Ambulatory Visit | Attending: Cardiology | Admitting: Cardiology

## 2016-10-02 VITALS — BP 174/56 | HR 67 | Wt 225.1 lb

## 2016-10-02 DIAGNOSIS — I5032 Chronic diastolic (congestive) heart failure: Secondary | ICD-10-CM | POA: Diagnosis present

## 2016-10-02 DIAGNOSIS — I5033 Acute on chronic diastolic (congestive) heart failure: Secondary | ICD-10-CM

## 2016-10-02 DIAGNOSIS — I480 Paroxysmal atrial fibrillation: Secondary | ICD-10-CM | POA: Insufficient documentation

## 2016-10-02 DIAGNOSIS — Z794 Long term (current) use of insulin: Secondary | ICD-10-CM | POA: Diagnosis not present

## 2016-10-02 DIAGNOSIS — Z87891 Personal history of nicotine dependence: Secondary | ICD-10-CM | POA: Insufficient documentation

## 2016-10-02 DIAGNOSIS — N183 Chronic kidney disease, stage 3 unspecified: Secondary | ICD-10-CM

## 2016-10-02 DIAGNOSIS — E669 Obesity, unspecified: Secondary | ICD-10-CM | POA: Diagnosis not present

## 2016-10-02 DIAGNOSIS — Z79899 Other long term (current) drug therapy: Secondary | ICD-10-CM | POA: Diagnosis not present

## 2016-10-02 DIAGNOSIS — E785 Hyperlipidemia, unspecified: Secondary | ICD-10-CM | POA: Insufficient documentation

## 2016-10-02 DIAGNOSIS — M545 Low back pain: Secondary | ICD-10-CM | POA: Diagnosis not present

## 2016-10-02 DIAGNOSIS — Z833 Family history of diabetes mellitus: Secondary | ICD-10-CM | POA: Insufficient documentation

## 2016-10-02 DIAGNOSIS — E7800 Pure hypercholesterolemia, unspecified: Secondary | ICD-10-CM

## 2016-10-02 DIAGNOSIS — E1122 Type 2 diabetes mellitus with diabetic chronic kidney disease: Secondary | ICD-10-CM | POA: Insufficient documentation

## 2016-10-02 DIAGNOSIS — I130 Hypertensive heart and chronic kidney disease with heart failure and stage 1 through stage 4 chronic kidney disease, or unspecified chronic kidney disease: Secondary | ICD-10-CM | POA: Insufficient documentation

## 2016-10-02 DIAGNOSIS — D631 Anemia in chronic kidney disease: Secondary | ICD-10-CM | POA: Insufficient documentation

## 2016-10-02 DIAGNOSIS — Z6836 Body mass index (BMI) 36.0-36.9, adult: Secondary | ICD-10-CM | POA: Diagnosis not present

## 2016-10-02 MED ORDER — SPIRONOLACTONE 25 MG PO TABS: 12.5000 mg | ORAL_TABLET | Freq: Every day | ORAL | 3 refills | Status: DC

## 2016-10-02 MED ORDER — POTASSIUM CHLORIDE CRYS ER 20 MEQ PO TBCR
40.0000 meq | EXTENDED_RELEASE_TABLET | Freq: Once | ORAL | Status: AC
  Administered 2016-10-02: 40 meq via ORAL
  Filled 2016-10-02: qty 2

## 2016-10-02 MED ORDER — POTASSIUM CHLORIDE 20 MEQ PO PACK
40.0000 meq | PACK | Freq: Once | ORAL | Status: DC
  Filled 2016-10-02: qty 2

## 2016-10-02 MED ORDER — FUROSEMIDE 10 MG/ML IJ SOLN
80.0000 mg | Freq: Once | INTRAMUSCULAR | Status: AC
  Administered 2016-10-02: 80 mg via INTRAVENOUS
  Filled 2016-10-02: qty 8

## 2016-10-02 NOTE — Progress Notes (Signed)
Advanced Heart Failure Medication Review by a Pharmacist  Does the patient  feel that his/her medications are working for him/her?  yes  Has the patient been experiencing any side effects to the medications prescribed?  no  Does the patient measure his/her own blood pressure or blood glucose at home?  yes   Does the patient have any problems obtaining medications due to transportation or finances?   no  Understanding of regimen: good Understanding of indications: good Potential of compliance: good Patient understands to avoid NSAIDs. Patient understands to avoid decongestants.  Issues to address at subsequent visits: none   Pharmacist comments: Patient presents for follow up visit without Rx bottles but with good understanding of regimen. Bumex was increased yesterday and patient states compliance with changes. Endorses hypoglycemia episodes several times monthly, reviewed how to correct BG appropriately. No adverse drug effects otherwise. Patient denies any questions.     Time with patient: 8 mins Preparation and documentation time: 5 mins Total time: 13 mins  Carlean Jews, Pharm.D. PGY1 Pharmacy Resident 6/8/201810:30 AM Pager [phone]

## 2016-10-02 NOTE — Progress Notes (Signed)
LATE ENTRY:  10:30 am 80 mg IV lasix administered via IV in left AC.    10:40 am 40 meq of KCL adminstered  Pt tolerated all well

## 2016-10-02 NOTE — Patient Instructions (Signed)
Start Spironolactone 12.5 mg (1/2 tab) daily  Your physician recommends that you schedule a follow-up appointment in: 1 week  Do the following things EVERYDAY: 1) Weigh yourself in the morning before breakfast. Write it down and keep it in a log. 2) Take your medicines as prescribed 3) Eat low salt foods-Limit salt (sodium) to 2000 mg per day.  4) Stay as active as you can everyday 5) Limit all fluids for the day to less than 2 liters

## 2016-10-02 NOTE — Progress Notes (Signed)
ADVANCED HF CLINIC NOTE  Patient ID: Shelia Woods, female   DOB: August 07, 1930, 83 y.o.   MRN: [id number] PCP: Dr. Shelda Altes Cardiology: Dr. Aundra Dubin  Shelia Woods is an 83 y/o woman with h/o obesity, HTN, CKD stage 3-4, PAF, diabetes and chronic diastolic CHF.   She had a nuclear stress test in 2007 that showed EF 69% and no ischemia. She has never has a cath. Renal dopplers in 2009 showed no RAS.   She does not ambulate much due to her severe back pain and uses a walker or wheelchair.   Has a h/o PAF. Event monitor in 10/16 showed 1 run of atrial fibrillation.  She is on apixaban 2.5 mg bid.   Today she returns for HF follow up. Yesterday she received IV lasix in the clinic and bumex was increased to 2 mg twice a day. Overall feeling better today. Remains SOB with exertion. Denies PND/Orthopnea. Ongoing leg edema. Over night she limited fluid and salt intake. Taking all medications.    Labs (10/16): LDL 89, K 4.1, creatinine 1.71, hgb 11.3 Labs (2/17): K 3.7, creatinine 1.58, LDL 93, TGs 285 Labs (8/17): K 4.4, creatinine 2.33 Labs (9/17): K 3.7, creatinine 2.04 Labs (10/17): K 3.6, creatinine 2.01, hgb 10.3 Labs (3/18): K 3.5, creatinine 1.68 Labs 10/01/2016: K 3.5 Creatinine 1.99  PMH: 1. Obesity 2. HTN: Renal artery dopplers (2009) with no renal artery stenosis. 3. CKD III-IV 4. Type II diabetes 5. Atrial fibrillation: Paroxysmal.  Event monitor (10/16) showed average HR 53 with 1 brief run of atrial fibrillation (controlled rate).  - Holter (11/17): rare PACs and PVCs.   6. Cardiolite (2007) with EF 69%, no ischemia.  7. Hyperlipidemia 8. Chronic diastolic CHF: Echo (46/50) with EF 60-65%, mild LVH.  Echo (11/16) with EF 60-65%, moderate LVH.  9. Low back pain: Severe and debilitating.  10. Anemia of chronic disease/renal disease  ROS: All systems negative except as listed in HPI, PMH and Problem List.  Social History   Social History  . Marital status: Married    Spouse  name: N/A  . Number of children: 4  . Years of education: N/A   Occupational History  .  Retired    retired/disabled (1994)   Social History Main Topics  . Smoking status: Former Smoker    Quit date: 04/27/1978  . Smokeless tobacco: Never Used  . Alcohol use No  . Drug use: No  . Sexual activity: Not on file   Other Topics Concern  . Not on file   Social History Narrative   Four children   Regular exercise-no    Family History  Problem Relation Age of Onset  . Diabetes Sister   . Parkinsonism Sister     Current Outpatient Prescriptions  Medication Sig Dispense Refill  . acetaminophen (TYLENOL) 500 MG tablet Take 1,000 mg by mouth 3 (three) times daily.     Marland Kitchen atorvastatin (LIPITOR) 40 MG tablet TAKE 1 TABLET(40 MG) BY MOUTH AT BEDTIME 90 tablet 1  . Blood Glucose Monitoring Suppl (ONE TOUCH ULTRA SYSTEM KIT) W/DEVICE KIT 1 kit by Does not apply route once. E11.9 1 each 0  . bumetanide (BUMEX) 1 MG tablet Take 2 tablets (2 mg total) by mouth 2 (two) times daily. 120 tablet 3  . cloNIDine (CATAPRES) 0.3 MG tablet Take 1.5 tablets (0.45 mg total) by mouth 3 (three) times daily. 270 tablet 1  . ELIQUIS 2.5 MG TABS tablet TAKE 1 TABLET BY MOUTH TWICE DAILY 60  tablet 5  . fenofibrate (TRICOR) 48 MG tablet Take 1 tablet (48 mg total) by mouth daily. 90 tablet 3  . Glucosamine-Chondroitin (OSTEO BI-FLEX REGULAR STRENGTH) 250-200 MG TABS Take 1 tablet by mouth 2 (two) times daily.     . hydrALAZINE (APRESOLINE) 100 MG tablet Take 1 tablet (100 mg total) by mouth 3 (three) times daily. 90 tablet 6  . insulin NPH Human (HUMULIN N,NOVOLIN N) 100 UNIT/ML injection Inject 0.3 mLs (30 Units total) into the skin at bedtime. 3 vial 3  . insulin regular (NOVOLIN R,HUMULIN R) 100 units/mL injection 15 units in the morning, 15 units midday and 20 units in the evening (Patient taking differently: 15 units in the morning, 15 units midday and 15 units in the evening) 10 mL 2  . isosorbide  mononitrate (IMDUR) 30 MG 24 hr tablet TAKE 1 TABLET BY MOUTH DAILY 90 tablet 3  . loratadine (CLARITIN) 10 MG tablet Take 10 mg by mouth daily as needed for allergies.     Marland Kitchen losartan (COZAAR) 100 MG tablet TAKE 1 TABLET(100 MG) BY MOUTH DAILY 30 tablet 11  . Melatonin 1 MG TABS Take 1 mg by mouth at bedtime.    . Multiple Vitamins-Minerals (PRESERVISION AREDS 2 PO) Take 1 capsule by mouth 2 (two) times daily.     . nebivolol (BYSTOLIC) 5 MG tablet Take 0.5 tablets (2.5 mg total) by mouth daily. 30 tablet 3  . NIFEdipine (PROCARDIA XL/ADALAT-CC) 60 MG 24 hr tablet Take 2 tablets (120 mg total) by mouth every morning. 180 tablet 1  . ONE TOUCH ULTRA TEST test strip TEST TWICE DAILY AS DIRECTED 100 each 0  . ONETOUCH DELICA LANCETS 67H MISC Use one lancet each time sugars are checked. Pt tests twice daily. 100 each 12  . ONETOUCH DELICA LANCETS FINE MISC USE TWICE DAILY 100 each 11  . polyethylene glycol (MIRALAX / GLYCOLAX) packet Take 17 g by mouth 2 (two) times daily as needed for mild constipation.    . traMADol (ULTRAM) 50 MG tablet TAKE 1/2-1 TABLET BY MOUTH EVERY 6 HOURS AS NEEDED FOR MODERATE PAIN 60 tablet 0  . fluocinonide cream (LIDEX) 0.05 % APPLY EXTERNALLY TO THE AFFECTED AREA TWICE DAILY (Patient not taking: Reported on 10/01/2016) 120 g 0   Current Facility-Administered Medications  Medication Dose Route Frequency Provider Last Rate Last Dose  . furosemide (LASIX) injection 80 mg  80 mg Intravenous Once Kimberly Nieland D, NP      . potassium chloride SA (K-DUR,KLOR-CON) CR tablet 40 mEq  40 mEq Oral Once Jonmarc Bodkin D, NP        Vitals:   10/02/16 1014  BP: (!) 174/56  Pulse: 67  SpO2: 96%  Weight: 225 lb 2 oz (102.1 kg)    Filed Weights   10/02/16 1014  Weight: 225 lb 2 oz (102.1 kg)   Filed Weights   10/02/16 1014  Weight: 225 lb 2 oz (102.1 kg)   PHYSICAL EXAM: General:  Well appearing. No resp difficulty. Arrive in a wheel chair. Son present.  HEENT: normal Neck:  supple. JVP ~10. Carotids 2+ bilat; no bruits. No lymphadenopathy or thryomegaly appreciated. Cor: PMI nondisplaced. Regular rate & rhythm. No rubs, gallops or murmurs. Lungs: clear Abdomen: soft, nontender, nondistended. No hepatosplenomegaly. No bruits or masses. Good bowel sounds. Extremities: no cyanosis, clubbing, rash, L>R edema with compression stockings in place. L 3+>2+   Neuro: alert & orientedx3, cranial nerves grossly intact. moves all 4 extremities w/o difficulty.  Affect pleasant  ASSESSMENT & PLAN: 1. Chest pain: She has multiple RFs for CAD, so it is certainly possible that her prior chest tightness was due to angina.  No CP - Continue Imdur 30 daily, no aspirin due to apixaban use - Continue statin 2. Atrial fibrillation: Paroxysmal. -  Regular pulse. No bleeding problems.  -  Continue apixaban (reduced dose given age and creatinine).   - Denies hematochezia and melena.  3. Hyperlipidemia:  - - LDL 64 - Triglycerides 396, continue fenofibrate.  - Continue statin.  4. HTN: Elevated. Add spiro . Continue other meds.  5. Acute on Chronic diastolic CHF:  -NYHA III. Improved today. Volume improving. Give another 80 mg IV lasix + 40 meq potassium in the clinic now.  Tomorrow she will restart bumex 2 mg twice a day. Hopefully weight will be closer to 200 pounds. Diuresis will be limited with CKD.  Add 12.5 mg spiro daily. Check  BMET/BNP next week.    6. CKD: Stage III. Sees Dr Joelyn Oms. Creatinine baseline 2-2.3. Repeat BMET next week.   Today Zack from Paramedicine met with her and will start care on Monday.   Follow up next week to reassess volume status and obtain labs.   Greater than 50% of the (total minutes 45 ) visit spent in counseling/coordination of care regarding heart failure, spironolactone, low salt diet, and limiting fluid intake.   Likisha Alles NP-C  10/02/2016

## 2016-10-05 ENCOUNTER — Other Ambulatory Visit (HOSPITAL_COMMUNITY): Payer: Self-pay

## 2016-10-05 NOTE — Progress Notes (Signed)
Paramedicine Encounter    Patient ID: Shelia Woods, female    DOB: 1930-12-17, 83 y.o.   MRN: [id number]   Patient Care Team: Larey Dresser, MD as Consulting Physician (Cardiology)  Patient Active Problem List   Diagnosis Date Noted  . Chronic diastolic CHF (congestive heart failure) ([place]) 10/20/2015  . Chest pressure 01/23/2015  . PAF (paroxysmal atrial fibrillation) ([place]) 01/23/2015  . Abdominal pain 11/08/2013  . CKD (chronic kidney disease), stage III 11/08/2013  . Colitis 11/08/2013  . General medical examination 01/13/2011  . MUSCLE SPASM, TRAPEZIUS 04/02/2010  . WOUND, OPEN, ELBOW 04/02/2010  . CONTUSION, HEAD 04/02/2010  . CONTUSION OF CHEST WALL 04/02/2010  . CONTUSION, ELBOW 04/02/2010  . CANDIDIASIS, SKIN 01/02/2010  . MACULAR DEGENERATION, SENILE, NONEXUDATIVE 01/01/2010  . CATARACTS, SENILE, NUCLEAR, BILATERAL 01/01/2010  . UTI 11/11/2009  . EDEMA- LOCALIZED 09/11/2009  . DIZZINESS 08/08/2009  . SMALL BOWEL OBSTRUCTION 11/05/2008  . PAROXYSMAL ATRIAL FIBRILLATION 09/29/2008  . RHINITIS 09/06/2008  . PROTEINURIA 05/17/2008  . Chronic kidney disease 04/10/2008  . INGROWN TOENAIL, INFECTED 04/10/2008  . DERMATOPHYTOSIS OF THE BODY 11/30/2007  . HYPERKALEMIA 11/30/2007  . RENAL FAILURE 11/22/2007  . FATIGUE 11/22/2007  . UNSPECIFIED CONSTIPATION 03/01/2007  . NEOP, MALIGNANT, SKIN NOS 11/08/2006  . SYNDROME, CARPAL TUNNEL 11/08/2006  . PERIPHERAL NEUROPATHY, LOWER EXTREMITY 11/08/2006  . Type 2 diabetes, controlled, with neuropathy ([place]) 06/24/2006  . HYPERCHOLESTEROLEMIA 06/24/2006  . HYPERTENSION, BENIGN SYSTEMIC 06/24/2006  . Osteoarthrosis involving lower leg 06/24/2006  . BACK PAIN, LOW 06/24/2006    Current Outpatient Prescriptions:  .  acetaminophen (TYLENOL) 500 MG tablet, Take 1,000 mg by mouth 3 (three) times daily. , Disp: , Rfl:  .  atorvastatin (LIPITOR) 40 MG tablet, TAKE 1 TABLET(40 MG) BY MOUTH AT BEDTIME, Disp: 90 tablet, Rfl: 1 .   Blood Glucose Monitoring Suppl (ONE TOUCH ULTRA SYSTEM KIT) W/DEVICE KIT, 1 kit by Does not apply route once. E11.9, Disp: 1 each, Rfl: 0 .  bumetanide (BUMEX) 1 MG tablet, Take 2 tablets (2 mg total) by mouth 2 (two) times daily., Disp: 120 tablet, Rfl: 3 .  cloNIDine (CATAPRES) 0.3 MG tablet, Take 1.5 tablets (0.45 mg total) by mouth 3 (three) times daily., Disp: 270 tablet, Rfl: 1 .  ELIQUIS 2.5 MG TABS tablet, TAKE 1 TABLET BY MOUTH TWICE DAILY, Disp: 60 tablet, Rfl: 5 .  fenofibrate (TRICOR) 48 MG tablet, Take 1 tablet (48 mg total) by mouth daily., Disp: 90 tablet, Rfl: 3 .  fluocinonide cream (LIDEX) 0.05 %, APPLY EXTERNALLY TO THE AFFECTED AREA TWICE DAILY (Patient not taking: Reported on 10/01/2016), Disp: 120 g, Rfl: 0 .  Glucosamine-Chondroitin (OSTEO BI-FLEX REGULAR STRENGTH) 250-200 MG TABS, Take 1 tablet by mouth 2 (two) times daily. , Disp: , Rfl:  .  hydrALAZINE (APRESOLINE) 100 MG tablet, Take 1 tablet (100 mg total) by mouth 3 (three) times daily., Disp: 90 tablet, Rfl: 6 .  insulin NPH Human (HUMULIN N,NOVOLIN N) 100 UNIT/ML injection, Inject 0.3 mLs (30 Units total) into the skin at bedtime., Disp: 3 vial, Rfl: 3 .  insulin regular (NOVOLIN R,HUMULIN R) 100 units/mL injection, 15 units in the morning, 15 units midday and 20 units in the evening (Patient taking differently: 15 units in the morning, 15 units midday and 15 units in the evening), Disp: 10 mL, Rfl: 2 .  isosorbide mononitrate (IMDUR) 30 MG 24 hr tablet, TAKE 1 TABLET BY MOUTH DAILY, Disp: 90 tablet, Rfl: 3 .  loratadine (CLARITIN) 10  MG tablet, Take 10 mg by mouth daily as needed for allergies. , Disp: , Rfl:  .  losartan (COZAAR) 100 MG tablet, TAKE 1 TABLET(100 MG) BY MOUTH DAILY, Disp: 30 tablet, Rfl: 11 .  Melatonin 1 MG TABS, Take 1 mg by mouth at bedtime., Disp: , Rfl:  .  Multiple Vitamins-Minerals (PRESERVISION AREDS 2 PO), Take 1 capsule by mouth 2 (two) times daily. , Disp: , Rfl:  .  nebivolol (BYSTOLIC) 5 MG  tablet, Take 0.5 tablets (2.5 mg total) by mouth daily., Disp: 30 tablet, Rfl: 3 .  NIFEdipine (PROCARDIA XL/ADALAT-CC) 60 MG 24 hr tablet, Take 2 tablets (120 mg total) by mouth every morning., Disp: 180 tablet, Rfl: 1 .  ONE TOUCH ULTRA TEST test strip, TEST TWICE DAILY AS DIRECTED, Disp: 100 each, Rfl: 0 .  ONETOUCH DELICA LANCETS 71I MISC, Use one lancet each time sugars are checked. Pt tests twice daily., Disp: 100 each, Rfl: 12 .  ONETOUCH DELICA LANCETS FINE MISC, USE TWICE DAILY, Disp: 100 each, Rfl: 11 .  polyethylene glycol (MIRALAX / GLYCOLAX) packet, Take 17 g by mouth 2 (two) times daily as needed for mild constipation., Disp: , Rfl:  .  spironolactone (ALDACTONE) 25 MG tablet, Take 0.5 tablets (12.5 mg total) by mouth daily., Disp: 15 tablet, Rfl: 3 .  traMADol (ULTRAM) 50 MG tablet, TAKE 1/2-1 TABLET BY MOUTH EVERY 6 HOURS AS NEEDED FOR MODERATE PAIN, Disp: 60 tablet, Rfl: 0 Allergies  Allergen Reactions  . Aranesp (Albumin Free) [Darbepoetin Alfa] Other (See Comments)    Headaches, lower extremity edema, nausea   . Beta Adrenergic Blockers     REACTION: depression  . Cephalexin Other (See Comments)    unknown  . Codone [Hydrocodone] Nausea And Vomiting  . Darvon [Propoxyphene]     dizziness  . Penicillins     REACTION: hives/intestinal tract  . Warfarin Sodium     REACTION: bleeding on skin surface  . Latex Rash     Social History   Social History  . Marital status: Married    Spouse name: N/A  . Number of children: 4  . Years of education: N/A   Occupational History  .  Retired    retired/disabled (1994)   Social History Main Topics  . Smoking status: Former Smoker    Quit date: 04/27/1978  . Smokeless tobacco: Never Used  . Alcohol use No  . Drug use: No  . Sexual activity: Not on file   Other Topics Concern  . Not on file   Social History Narrative   Four children   Regular exercise-no    Physical Exam  Constitutional: She is oriented to  person, place, and time.  Cardiovascular: Normal rate.   Pulmonary/Chest: Effort normal and breath sounds normal. No respiratory distress. She has no wheezes. She has no rales.  Abdominal: Soft.  Musculoskeletal: Normal range of motion. She exhibits edema.  Neurological: She is alert and oriented to person, place, and time.  Skin: Skin is warm and dry.        Future Appointments Date Time Provider [place]  10/07/2016 12:00 PM [facility] PA/NP [facility] None   BP (!) 154/60 (BP Location: Left Arm, Patient Position: Sitting, Cuff Size: Normal)   Pulse 60   Resp 16   Wt 220 lb (99.8 kg)   SpO2 96%   BMI 35.51 kg/m  Weight yesterday- 225 lbs Last visit weight- n/a  Ms Klinke was seen at home today and reports feeling  well. Paramedicine was asked to see her today after she received IV lasix at the clinic on Friday. She had reduced, yet still present, edema in her legs but she denied SOB. Paramedicine will follow up next week for another visit and home assesment.  Jacquiline Doe, EMT 10/05/16  ACTION: Home visit completed Next visit planned for 1 week

## 2016-10-07 ENCOUNTER — Ambulatory Visit (HOSPITAL_COMMUNITY)
Admission: RE | Admit: 2016-10-07 | Discharge: 2016-10-07 | Disposition: A | Discharge status: Home / Self Care | Payer: Medicare Other | Source: Ambulatory Visit | Attending: Internal Medicine | Admitting: Internal Medicine

## 2016-10-07 ENCOUNTER — Encounter (HOSPITAL_COMMUNITY): Payer: Self-pay

## 2016-10-07 VITALS — BP 140/51 | HR 71 | Wt 221.6 lb

## 2016-10-07 DIAGNOSIS — Z79899 Other long term (current) drug therapy: Secondary | ICD-10-CM | POA: Diagnosis not present

## 2016-10-07 DIAGNOSIS — I130 Hypertensive heart and chronic kidney disease with heart failure and stage 1 through stage 4 chronic kidney disease, or unspecified chronic kidney disease: Secondary | ICD-10-CM | POA: Diagnosis not present

## 2016-10-07 DIAGNOSIS — I480 Paroxysmal atrial fibrillation: Secondary | ICD-10-CM | POA: Insufficient documentation

## 2016-10-07 DIAGNOSIS — Z820 Family history of epilepsy and other diseases of the nervous system: Secondary | ICD-10-CM | POA: Insufficient documentation

## 2016-10-07 DIAGNOSIS — Z87891 Personal history of nicotine dependence: Secondary | ICD-10-CM | POA: Insufficient documentation

## 2016-10-07 DIAGNOSIS — N184 Chronic kidney disease, stage 4 (severe): Secondary | ICD-10-CM | POA: Diagnosis not present

## 2016-10-07 DIAGNOSIS — Z794 Long term (current) use of insulin: Secondary | ICD-10-CM | POA: Insufficient documentation

## 2016-10-07 DIAGNOSIS — E1122 Type 2 diabetes mellitus with diabetic chronic kidney disease: Secondary | ICD-10-CM | POA: Diagnosis not present

## 2016-10-07 DIAGNOSIS — I10 Essential (primary) hypertension: Secondary | ICD-10-CM | POA: Diagnosis not present

## 2016-10-07 DIAGNOSIS — E785 Hyperlipidemia, unspecified: Secondary | ICD-10-CM | POA: Diagnosis not present

## 2016-10-07 DIAGNOSIS — E669 Obesity, unspecified: Secondary | ICD-10-CM | POA: Insufficient documentation

## 2016-10-07 DIAGNOSIS — N183 Chronic kidney disease, stage 3 unspecified: Secondary | ICD-10-CM

## 2016-10-07 DIAGNOSIS — Z833 Family history of diabetes mellitus: Secondary | ICD-10-CM | POA: Insufficient documentation

## 2016-10-07 DIAGNOSIS — I110 Hypertensive heart disease with heart failure: Secondary | ICD-10-CM | POA: Diagnosis not present

## 2016-10-07 DIAGNOSIS — I5032 Chronic diastolic (congestive) heart failure: Secondary | ICD-10-CM | POA: Diagnosis not present

## 2016-10-07 LAB — BASIC METABOLIC PANEL
ANION GAP: 10 (ref 5–15)
BUN: 28 mg/dL — ABNORMAL HIGH (ref 6–20)
CHLORIDE: 103 mmol/L (ref 101–111)
CO2: 25 mmol/L (ref 22–32)
CREATININE: 2.12 mg/dL — AB (ref 0.44–1.00)
Calcium: 9.3 mg/dL (ref 8.9–10.3)
GFR calc non Af Amer: 20 mL/min — ABNORMAL LOW (ref >60)
GFR, EST AFRICAN AMERICAN: 23 mL/min — AB (ref >60)
Glucose, Bld: 157 mg/dL — ABNORMAL HIGH (ref 65–99)
POTASSIUM: 3.6 mmol/L (ref 3.5–5.1)
SODIUM: 138 mmol/L (ref 135–145)

## 2016-10-07 LAB — BRAIN NATRIURETIC PEPTIDE: B NATRIURETIC PEPTIDE 5: 157.9 pg/mL — AB (ref 0.0–100.0)

## 2016-10-07 MED ORDER — METOLAZONE 2.5 MG PO TABS: 2.5000 mg | ORAL_TABLET | ORAL | 0 refills | Status: DC

## 2016-10-07 MED ORDER — METOLAZONE 2.5 MG PO TABS: 2.5000 mg | ORAL_TABLET | ORAL | 0 refills | Status: AC

## 2016-10-07 NOTE — Progress Notes (Signed)
ADVANCED HF CLINIC NOTE  Patient ID: Shelia Woods, female   DOB: 1930-05-09, 83 y.o.   MRN: [id number] PCP: Dr. Shelda Altes Cardiology: Dr. Aundra Dubin  Shelia Woods is an 83 y/o woman with h/o obesity, HTN, CKD stage 3-4, PAF, diabetes and chronic diastolic CHF.   She had a nuclear stress test in 2007 that showed EF 69% and no ischemia. She has never has a cath. Renal dopplers in 2009 showed no RAS.   She does not ambulate much due to her severe back pain and uses a walker or wheelchair.   Has a h/o PAF. Event monitor in 10/16 showed 1 run of atrial fibrillation.  She is on apixaban 2.5 mg bid.   She presents today for 1 week follow up. At last visit given IV lasix two days in a row. Paramedicine now following.  Weight down to 220 lbs at home. (total of 9 lbs from initial IV lasix last week. Mild SOB with exertion, she thinks it more related to her chronic back issues. Sleeps upright in a chair chronically. BP at home 130s. Denies lightheadedness or dizziness. Limiting fluid intake to < 64 oz/2L. Limiting salt intake. Taking all medications as directed.   Labs (10/16): LDL 89, K 4.1, creatinine 1.71, hgb 11.3 Labs (2/17): K 3.7, creatinine 1.58, LDL 93, TGs 285 Labs (8/17): K 4.4, creatinine 2.33 Labs (9/17): K 3.7, creatinine 2.04 Labs (10/17): K 3.6, creatinine 2.01, hgb 10.3 Labs (3/18): K 3.5, creatinine 1.68 Labs 10/01/2016: K 3.5 Creatinine 1.99  PMH: 1. Obesity 2. HTN: Renal artery dopplers (2009) with no renal artery stenosis. 3. CKD III-IV 4. Type II diabetes 5. Atrial fibrillation: Paroxysmal.  Event monitor (10/16) showed average HR 53 with 1 brief run of atrial fibrillation (controlled rate).  - Holter (11/17): rare PACs and PVCs.   6. Cardiolite (2007) with EF 69%, no ischemia.  7. Hyperlipidemia 8. Chronic diastolic CHF: Echo (93/71) with EF 60-65%, mild LVH.  Echo (11/16) with EF 60-65%, moderate LVH.  9. Low back pain: Severe and debilitating.  10. Anemia of chronic  disease/renal disease  Review of systems complete and found to be negative unless listed in HPI.    Social History   Social History  . Marital status: Married    Spouse name: N/A  . Number of children: 4  . Years of education: N/A   Occupational History  .  Retired    retired/disabled (1994)   Social History Main Topics  . Smoking status: Former Smoker    Quit date: 04/27/1978  . Smokeless tobacco: Never Used  . Alcohol use No  . Drug use: No  . Sexual activity: Not on file   Other Topics Concern  . Not on file   Social History Narrative   Four children   Regular exercise-no    Family History  Problem Relation Age of Onset  . Diabetes Sister   . Parkinsonism Sister     Current Outpatient Prescriptions  Medication Sig Dispense Refill  . acetaminophen (TYLENOL) 500 MG tablet Take 1,000 mg by mouth 3 (three) times daily.     Marland Kitchen atorvastatin (LIPITOR) 40 MG tablet TAKE 1 TABLET(40 MG) BY MOUTH AT BEDTIME 90 tablet 1  . Blood Glucose Monitoring Suppl (ONE TOUCH ULTRA SYSTEM KIT) W/DEVICE KIT 1 kit by Does not apply route once. E11.9 1 each 0  . bumetanide (BUMEX) 1 MG tablet Take 2 tablets (2 mg total) by mouth 2 (two) times daily. 120 tablet 3  .  cloNIDine (CATAPRES) 0.3 MG tablet Take 1.5 tablets (0.45 mg total) by mouth 3 (three) times daily. 270 tablet 1  . ELIQUIS 2.5 MG TABS tablet TAKE 1 TABLET BY MOUTH TWICE DAILY 60 tablet 5  . fenofibrate (TRICOR) 48 MG tablet Take 1 tablet (48 mg total) by mouth daily. 90 tablet 3  . fluocinonide cream (LIDEX) 0.05 % APPLY EXTERNALLY TO THE AFFECTED AREA TWICE DAILY 120 g 0  . Glucosamine-Chondroitin (OSTEO BI-FLEX REGULAR STRENGTH) 250-200 MG TABS Take 1 tablet by mouth 2 (two) times daily.     . hydrALAZINE (APRESOLINE) 100 MG tablet Take 1 tablet (100 mg total) by mouth 3 (three) times daily. 90 tablet 6  . insulin NPH Human (HUMULIN N,NOVOLIN N) 100 UNIT/ML injection Inject 0.3 mLs (30 Units total) into the skin at bedtime. 3  vial 3  . insulin regular (NOVOLIN R,HUMULIN R) 100 units/mL injection 15 units in the morning, 15 units midday and 20 units in the evening (Patient taking differently: 15 units in the morning, 15 units midday and 15 units in the evening) 10 mL 2  . isosorbide mononitrate (IMDUR) 30 MG 24 hr tablet TAKE 1 TABLET BY MOUTH DAILY 90 tablet 3  . loratadine (CLARITIN) 10 MG tablet Take 10 mg by mouth daily as needed for allergies.     Marland Kitchen losartan (COZAAR) 100 MG tablet TAKE 1 TABLET(100 MG) BY MOUTH DAILY 30 tablet 11  . Melatonin 1 MG TABS Take 1 mg by mouth at bedtime.    . Multiple Vitamins-Minerals (PRESERVISION AREDS 2 PO) Take 1 capsule by mouth 2 (two) times daily.     . nebivolol (BYSTOLIC) 5 MG tablet Take 0.5 tablets (2.5 mg total) by mouth daily. 30 tablet 3  . NIFEdipine (PROCARDIA XL/ADALAT-CC) 60 MG 24 hr tablet Take 2 tablets (120 mg total) by mouth every morning. 180 tablet 1  . ONE TOUCH ULTRA TEST test strip TEST TWICE DAILY AS DIRECTED 100 each 0  . ONETOUCH DELICA LANCETS 27P MISC Use one lancet each time sugars are checked. Pt tests twice daily. 100 each 12  . ONETOUCH DELICA LANCETS FINE MISC USE TWICE DAILY 100 each 11  . polyethylene glycol (MIRALAX / GLYCOLAX) packet Take 17 g by mouth 2 (two) times daily as needed for mild constipation.    Marland Kitchen spironolactone (ALDACTONE) 25 MG tablet Take 0.5 tablets (12.5 mg total) by mouth daily. 15 tablet 3  . traMADol (ULTRAM) 50 MG tablet TAKE 1/2-1 TABLET BY MOUTH EVERY 6 HOURS AS NEEDED FOR MODERATE PAIN 60 tablet 0  . metolazone (ZAROXOLYN) 2.5 MG tablet Take 1 tablet (2.5 mg total) by mouth as directed. Call CHF clinic before taking. 5 tablet 0   No current facility-administered medications for this encounter.    Vitals:   10/07/16 1221  BP: (!) 140/51  Pulse: 71  SpO2: 96%  Weight: 221 lb 9.6 oz (100.5 kg)    Wt Readings from Last 3 Encounters:  10/07/16 221 lb 9.6 oz (100.5 kg)  10/05/16 220 lb (99.8 kg)  10/02/16 225 lb 2 oz  (102.1 kg)   PHYSICAL EXAM: General: Well appearing, Elderly. NAD. In [place]. Son present.  HEENT: Normal Neck: supple. JVP 9-10 cm. Carotids 2+ bilat; no bruits. No thyromegaly or nodule noted. Cor: PMI nondisplaced. RRR, No M/G/R noted Lungs: CTAB, normal effort. Abdomen: soft, non-tender, distended, no HSM. No bruits or masses. +BS  Extremities: no cyanosis, clubbing, or rash. BLE 2-3+ edema.   Neuro: alert & orientedx3, cranial  nerves grossly intact. moves all 4 extremities w/o difficulty. Affect pleasant   ASSESSMENT & PLAN: 1. Chest pain: She has multiple RFs for CAD, so it is certainly possible that her prior chest tightness was due to angina.  No Chest pain. No change to medical regimen.  - Continue Imdur 30 daily, no aspirin due to apixaban use - Continue statin 2. Atrial fibrillation: Paroxysmal. -  Regular on exam. No bleeding problems on apixaban.  - Denies hematochezia and melena.  3. Hyperlipidemia:  - LDL 64 - Triglycerides 396, continue fenofibrate.  - Continue statin.  4. HTN:  - BP relatively stable with addition of spiro last week.  - Meds as above.  5. Chronic diastolic CHF:  -NYHA III. - Volume status slowly - Continue bumex 2 mg BID - Tomorrow, take 2.5 mg metolazone and call with results on Friday. Diuresis limited by CKD - Continue 12.5 mg spiro daily. BMET/BNP today.  6. CKD: Stage III. Sees Dr Joelyn Oms on July 9th.  - Creatinine baseline 2-2.3. BMET today. Will forward labs to Dr. Joelyn Oms office after next visit.   Continue paramedicine. Meds as above. RTC 10 days.   Shirley Friar, PA-C   10/07/2016  Greater than 50% of the 25 minute visit was spent in counseling/coordination of care regarding disease state education, salt/fluid restriction, medication reconciliation, and discussion of medical regimen with on site Pharm-D.

## 2016-10-07 NOTE — Patient Instructions (Signed)
Routine lab work today. Will notify you of abnormal results, otherwise no news is good news!  Take metolazone 2.5 mg tablet once tomorrow ONLY.  Call CHF clinic Friday to update on weight and symptoms.  Follow up with Oda Kilts PA-C on Monday June 25th  Time:  Garage Code:  Take all medication as prescribed the day of your appointment. Bring all medications with you to your appointment.  Do the following things EVERYDAY: 1) Weigh yourself in the morning before breakfast. Write it down and keep it in a log. 2) Take your medicines as prescribed 3) Eat low salt foods-Limit salt (sodium) to 2000 mg per day.  4) Stay as active as you can everyday 5) Limit all fluids for the day to less than 2 liters

## 2016-10-09 ENCOUNTER — Telehealth (HOSPITAL_COMMUNITY): Payer: Self-pay | Admitting: *Deleted

## 2016-10-09 NOTE — Telephone Encounter (Signed)
Patient called today to let Oda Kilts, PA know that her weight is down 2 more lbs as of this morning.  Her weight is 219 lb.  I will send to Oda Kilts, PA to make him aware and will call her back with any changes.

## 2016-10-09 NOTE — Telephone Encounter (Signed)
OK. Let us know if weight climbs back up next week.  Can repeat one time dose of metolazone middle of next week if weight climbs back up > 222.    Legrand Como [address] [place], PA-C 10/09/2016 11:05 AM

## 2016-10-12 ENCOUNTER — Other Ambulatory Visit (HOSPITAL_COMMUNITY): Payer: Self-pay

## 2016-10-12 NOTE — Telephone Encounter (Signed)
Called and spoke with patient.  She agrees with plan and no further questions at this time.  She stated that her weight today was 218.

## 2016-10-12 NOTE — Progress Notes (Signed)
Paramedicine Encounter    Patient ID: Shelia Woods, female    DOB: [birth year], 83 y.o.   MRN: [id number]   Patient Care Team: Larey Dresser, MD as Consulting Physician (Cardiology)  Patient Active Problem List   Diagnosis Date Noted  . Chronic diastolic CHF (congestive heart failure) ([place]) 10/20/2015  . Chest pressure 01/23/2015  . PAF (paroxysmal atrial fibrillation) ([place]) 01/23/2015  . Abdominal pain 11/08/2013  . CKD (chronic kidney disease), stage III 11/08/2013  . Colitis 11/08/2013  . General medical examination 01/13/2011  . MUSCLE SPASM, TRAPEZIUS 04/02/2010  . WOUND, OPEN, ELBOW 04/02/2010  . CONTUSION, HEAD 04/02/2010  . CONTUSION OF CHEST WALL 04/02/2010  . CONTUSION, ELBOW 04/02/2010  . CANDIDIASIS, SKIN 01/02/2010  . MACULAR DEGENERATION, SENILE, NONEXUDATIVE 01/01/2010  . CATARACTS, SENILE, NUCLEAR, BILATERAL 01/01/2010  . UTI 11/11/2009  . EDEMA- LOCALIZED 09/11/2009  . DIZZINESS 08/08/2009  . SMALL BOWEL OBSTRUCTION 11/05/2008  . PAROXYSMAL ATRIAL FIBRILLATION 09/29/2008  . RHINITIS 09/06/2008  . PROTEINURIA 05/17/2008  . Chronic kidney disease 04/10/2008  . INGROWN TOENAIL, INFECTED 04/10/2008  . DERMATOPHYTOSIS OF THE BODY 11/30/2007  . HYPERKALEMIA 11/30/2007  . RENAL FAILURE 11/22/2007  . FATIGUE 11/22/2007  . UNSPECIFIED CONSTIPATION 03/01/2007  . NEOP, MALIGNANT, SKIN NOS 11/08/2006  . SYNDROME, CARPAL TUNNEL 11/08/2006  . PERIPHERAL NEUROPATHY, LOWER EXTREMITY 11/08/2006  . Type 2 diabetes, controlled, with neuropathy ([place]) 06/24/2006  . HYPERCHOLESTEROLEMIA 06/24/2006  . HYPERTENSION, BENIGN SYSTEMIC 06/24/2006  . Osteoarthrosis involving lower leg 06/24/2006  . BACK PAIN, LOW 06/24/2006    Current Outpatient Prescriptions:  .  acetaminophen (TYLENOL) 500 MG tablet, Take 1,000 mg by mouth 3 (three) times daily. , Disp: , Rfl:  .  atorvastatin (LIPITOR) 40 MG tablet, TAKE 1 TABLET(40 MG) BY MOUTH AT BEDTIME, Disp: 90 tablet, Rfl: 1 .   Blood Glucose Monitoring Suppl (ONE TOUCH ULTRA SYSTEM KIT) W/DEVICE KIT, 1 kit by Does not apply route once. E11.9, Disp: 1 each, Rfl: 0 .  bumetanide (BUMEX) 1 MG tablet, Take 2 tablets (2 mg total) by mouth 2 (two) times daily., Disp: 120 tablet, Rfl: 3 .  cloNIDine (CATAPRES) 0.3 MG tablet, Take 1.5 tablets (0.45 mg total) by mouth 3 (three) times daily., Disp: 270 tablet, Rfl: 1 .  ELIQUIS 2.5 MG TABS tablet, TAKE 1 TABLET BY MOUTH TWICE DAILY, Disp: 60 tablet, Rfl: 5 .  fenofibrate (TRICOR) 48 MG tablet, Take 1 tablet (48 mg total) by mouth daily., Disp: 90 tablet, Rfl: 3 .  Glucosamine-Chondroitin (OSTEO BI-FLEX REGULAR STRENGTH) 250-200 MG TABS, Take 1 tablet by mouth 2 (two) times daily. , Disp: , Rfl:  .  hydrALAZINE (APRESOLINE) 100 MG tablet, Take 1 tablet (100 mg total) by mouth 3 (three) times daily., Disp: 90 tablet, Rfl: 6 .  insulin NPH Human (HUMULIN N,NOVOLIN N) 100 UNIT/ML injection, Inject 0.3 mLs (30 Units total) into the skin at bedtime., Disp: 3 vial, Rfl: 3 .  insulin regular (NOVOLIN R,HUMULIN R) 100 units/mL injection, 15 units in the morning, 15 units midday and 20 units in the evening (Patient taking differently: 15 units in the morning, 15 units midday and 15 units in the evening), Disp: 10 mL, Rfl: 2 .  isosorbide mononitrate (IMDUR) 30 MG 24 hr tablet, TAKE 1 TABLET BY MOUTH DAILY, Disp: 90 tablet, Rfl: 3 .  loratadine (CLARITIN) 10 MG tablet, Take 10 mg by mouth daily as needed for allergies. , Disp: , Rfl:  .  losartan (COZAAR) 100 MG tablet, TAKE 1  TABLET(100 MG) BY MOUTH DAILY, Disp: 30 tablet, Rfl: 11 .  Melatonin 1 MG TABS, Take 1 mg by mouth at bedtime., Disp: , Rfl:  .  metolazone (ZAROXOLYN) 2.5 MG tablet, Take 1 tablet (2.5 mg total) by mouth as directed. Call CHF clinic before taking., Disp: 5 tablet, Rfl: 0 .  Multiple Vitamins-Minerals (PRESERVISION AREDS 2 PO), Take 1 capsule by mouth 2 (two) times daily. , Disp: , Rfl:  .  nebivolol (BYSTOLIC) 5 MG  tablet, Take 0.5 tablets (2.5 mg total) by mouth daily., Disp: 30 tablet, Rfl: 3 .  NIFEdipine (PROCARDIA XL/ADALAT-CC) 60 MG 24 hr tablet, Take 2 tablets (120 mg total) by mouth every morning., Disp: 180 tablet, Rfl: 1 .  ONE TOUCH ULTRA TEST test strip, TEST TWICE DAILY AS DIRECTED, Disp: 100 each, Rfl: 0 .  ONETOUCH DELICA LANCETS 49F MISC, Use one lancet each time sugars are checked. Pt tests twice daily., Disp: 100 each, Rfl: 12 .  ONETOUCH DELICA LANCETS FINE MISC, USE TWICE DAILY, Disp: 100 each, Rfl: 11 .  polyethylene glycol (MIRALAX / GLYCOLAX) packet, Take 17 g by mouth 2 (two) times daily as needed for mild constipation., Disp: , Rfl:  .  spironolactone (ALDACTONE) 25 MG tablet, Take 0.5 tablets (12.5 mg total) by mouth daily., Disp: 15 tablet, Rfl: 3 .  traMADol (ULTRAM) 50 MG tablet, TAKE 1/2-1 TABLET BY MOUTH EVERY 6 HOURS AS NEEDED FOR MODERATE PAIN, Disp: 60 tablet, Rfl: 0 .  fluocinonide cream (LIDEX) 0.05 %, APPLY EXTERNALLY TO THE AFFECTED AREA TWICE DAILY (Patient not taking: Reported on 10/12/2016), Disp: 120 g, Rfl: 0 Allergies  Allergen Reactions  . Aranesp (Albumin Free) [Darbepoetin Alfa] Other (See Comments)    Headaches, lower extremity edema, nausea   . Beta Adrenergic Blockers     REACTION: depression  . Cephalexin Other (See Comments)    unknown  . Codone [Hydrocodone] Nausea And Vomiting  . Darvon [Propoxyphene]     dizziness  . Penicillins     REACTION: hives/intestinal tract  . Warfarin Sodium     REACTION: bleeding on skin surface  . Latex Rash     Social History   Social History  . Marital status: Married    Spouse name: N/A  . Number of children: 4  . Years of education: N/A   Occupational History  .  Retired    retired/disabled (1994)   Social History Main Topics  . Smoking status: Former Smoker    Quit date: 04/27/1978  . Smokeless tobacco: Never Used  . Alcohol use No  . Drug use: No  . Sexual activity: Not on file   Other Topics  Concern  . Not on file   Social History Narrative   Four children   Regular exercise-no    Physical Exam  Constitutional: She is oriented to person, place, and time. She appears well-developed.  Neck: Normal range of motion.  Cardiovascular: Normal rate.   Pulmonary/Chest: Effort normal and breath sounds normal. No respiratory distress. She has no wheezes. She has no rales.  Abdominal: Soft. Bowel sounds are normal.  Musculoskeletal: Normal range of motion. She exhibits edema.  Neurological: She is alert and oriented to person, place, and time.  Skin: Skin is warm and dry.        [place] - 10/12/16 1200      Situation   Admitting diagnosis chf   Heart failure history Exisiting   Comorbidities Atrial fibillation;CKD/renal insufficiency;DM;HTN   Readmitted within 30 days No  Hospital admission within past 12 months No     Assessment   Lives alone No   Primary support person Juanda Crumble (Husband)   Mode of transportation personal car   Other services involved None   Home equipement Cane;Scale;Walker;Wheelchair     [employer]   Weighs self daily Yes   Scale provided No   Records on weight chart Yes     Resources   Has "Living better w/heart failure" book Yes   Has HF Zone tool Yes   Able to identify yellow zone signs/when to call MD Yes   Records zone daily No     Medications   Uses a pill box Yes   Who stocks the pill box Paramedicine   Pill box checked this visit Yes   Pill box refilled this visit Yes   Difficulty obtaining medications No   Mail order medications No   Missed one or more doses of medications per week No     Nutrition   Patient receives meals on wheels No   Patient follows low sodium diet Yes   Has foods at home that meet the current recommended diet Yes   Patient follows low sugar/card diet Yes   Nutritional concerns/issues None     Activity Level   ADL's/Mobility Independent   How many feet can patient ambulate 30 feet     Urine   Difficulty  urinating No   Changes in urine None     Time spent with patient   Time spent with patient  [place] - 10/12/16 1200      Outside of House   Sidewalk and pathway to house is level and free from any hazards Yes   Driveway is free from debris/snow/ice Yes   Outside stairs are stable and have sturdy handrail N/A   Porch lights are working and provide adequate lighting Yes     Living Room   Furniture is of adequate height and offers arm rests that assist in getting up and down Yes   Floor is free from any clutter that would create tripping hazards Yes   All cords are either behind furniture or secured in a manner that does not cause trip hazards Yes   All rugs are secured to floor with double-sided tape N/A   Lighting is adequate to light room Yes   All lighting has an easily accessible on/off switch Yes   Phone is readily accessible near favorite seating areas Yes   Emergency numbers are printed near all phones in house Yes     Kitchen   Items used most often are within easy reach on low shelves Yes   Step stool is present, is sturdy and has a handrail N/A   Floor mats are non-slip tread and secured to floor Yes   Oven controls are within easy reach Yes   Kitchen lighting is adequate and easy to reach switches Yes   ABC fire extinguisher is located in kitchen No     [place] is properly secured to stairs and/or all wood is properly secured Yes   Handrail is present and sturdy Yes   Stairs are free from any clutter Yes   Stairway is adequately lit Yes     Bathroom   Tub and shower have a non-slip surface Yes   Tub and/or shower have a grab bar for stability Yes   Toilet has a raised seat  Yes   Grab bar is attached near toilet for assistance Yes   Pathway from bedroom to bathroom is free from clutter and well lit for ease of movement in the middle of the night Yes     Bedroom   Floor is free from clutter Yes   Light  is near bed and is easy to turn on Yes   Phone is next to bed and within easy reach Yes   Flashlight is near bed in case of emergency Yes     General   Smoke detectors in all areas of the house (each floor) and tested Yes   CO detectors on each floor of house and tested No   Flashlights are handy throughout the home Yes   Resident has all medical information readily available and in an area emergency providers will easily find Yes   All heaters are away from any type of flammable material Yes     Overall Tips   Homeowner ha good non-skid shoes to move around house Yes   All assisted walking devices are readily accessible and in good condition Yes   There is a phone near the floor for ease of reach in case of a fall YES   All O2 tubing is less than 50 ft. and is not a trip hazard N/A   Resident has had an annual hearing and vision check by a physician No   Resident has the proper hearing and visual aids prescribed and are in good working order Yes   All medications are properly stored and labeled to avoid confusion on dosage, time to take, and avoidance of missed doses Yes       Future Appointments Date Time Provider [place]  10/19/2016 3:30 PM [facility] PA/NP [facility] None   BP 140/64 (BP Location: Right Arm, Patient Position: Sitting, Cuff Size: Normal)   Pulse (!) 56   Resp 16   Wt 218 lb (98.9 kg)   SpO2 96%   BMI 35.19 kg/m  Weight yesterday- 218 lbs Last visit weight- 220 lbs CBG- 138 mg/dl  Ms Benning was seen at home today and reports feeling well. She denied SOB or dizziness but did say she had a headache last night. She stated she did not take anything for the headache and was able to go back to sleep. She continues to have lower extremity edema but she believes it is improving and her weight is down another 2 lbs over the weekend. Medications were verified and her pillbox was refilled. No refills were necessary at this time.  Jacquiline Doe,  EMT 10/12/16  ACTION: Home visit completed Next visit planned for 1 week

## 2016-10-19 ENCOUNTER — Other Ambulatory Visit (HOSPITAL_COMMUNITY): Payer: Self-pay

## 2016-10-19 ENCOUNTER — Encounter (HOSPITAL_COMMUNITY): Payer: Self-pay

## 2016-10-19 ENCOUNTER — Ambulatory Visit (HOSPITAL_COMMUNITY)
Admission: RE | Admit: 2016-10-19 | Discharge: 2016-10-19 | Disposition: A | Discharge status: Home / Self Care | Payer: Medicare Other | Source: Ambulatory Visit | Attending: Cardiology | Admitting: Cardiology

## 2016-10-19 VITALS — BP 137/46 | HR 65 | Wt 223.6 lb

## 2016-10-19 DIAGNOSIS — I10 Essential (primary) hypertension: Secondary | ICD-10-CM

## 2016-10-19 DIAGNOSIS — I480 Paroxysmal atrial fibrillation: Secondary | ICD-10-CM | POA: Diagnosis not present

## 2016-10-19 DIAGNOSIS — E785 Hyperlipidemia, unspecified: Secondary | ICD-10-CM | POA: Diagnosis not present

## 2016-10-19 DIAGNOSIS — R0789 Other chest pain: Secondary | ICD-10-CM | POA: Diagnosis not present

## 2016-10-19 DIAGNOSIS — M549 Dorsalgia, unspecified: Secondary | ICD-10-CM | POA: Diagnosis not present

## 2016-10-19 DIAGNOSIS — D631 Anemia in chronic kidney disease: Secondary | ICD-10-CM | POA: Insufficient documentation

## 2016-10-19 DIAGNOSIS — Z794 Long term (current) use of insulin: Secondary | ICD-10-CM | POA: Diagnosis not present

## 2016-10-19 DIAGNOSIS — Z833 Family history of diabetes mellitus: Secondary | ICD-10-CM | POA: Diagnosis not present

## 2016-10-19 DIAGNOSIS — I130 Hypertensive heart and chronic kidney disease with heart failure and stage 1 through stage 4 chronic kidney disease, or unspecified chronic kidney disease: Secondary | ICD-10-CM | POA: Insufficient documentation

## 2016-10-19 DIAGNOSIS — Z7902 Long term (current) use of antithrombotics/antiplatelets: Secondary | ICD-10-CM | POA: Diagnosis not present

## 2016-10-19 DIAGNOSIS — N183 Chronic kidney disease, stage 3 unspecified: Secondary | ICD-10-CM

## 2016-10-19 DIAGNOSIS — Z820 Family history of epilepsy and other diseases of the nervous system: Secondary | ICD-10-CM | POA: Insufficient documentation

## 2016-10-19 DIAGNOSIS — Z6836 Body mass index (BMI) 36.0-36.9, adult: Secondary | ICD-10-CM | POA: Insufficient documentation

## 2016-10-19 DIAGNOSIS — Z87891 Personal history of nicotine dependence: Secondary | ICD-10-CM | POA: Diagnosis not present

## 2016-10-19 DIAGNOSIS — G8929 Other chronic pain: Secondary | ICD-10-CM | POA: Insufficient documentation

## 2016-10-19 DIAGNOSIS — E669 Obesity, unspecified: Secondary | ICD-10-CM | POA: Diagnosis not present

## 2016-10-19 DIAGNOSIS — E1122 Type 2 diabetes mellitus with diabetic chronic kidney disease: Secondary | ICD-10-CM | POA: Diagnosis not present

## 2016-10-19 DIAGNOSIS — I5032 Chronic diastolic (congestive) heart failure: Secondary | ICD-10-CM | POA: Insufficient documentation

## 2016-10-19 DIAGNOSIS — N184 Chronic kidney disease, stage 4 (severe): Secondary | ICD-10-CM | POA: Diagnosis not present

## 2016-10-19 LAB — BASIC METABOLIC PANEL
Anion gap: 9 (ref 5–15)
BUN: 34 mg/dL — AB (ref 6–20)
CALCIUM: 9.3 mg/dL (ref 8.9–10.3)
CO2: 24 mmol/L (ref 22–32)
Chloride: 104 mmol/L (ref 101–111)
Creatinine, Ser: 2.15 mg/dL — ABNORMAL HIGH (ref 0.44–1.00)
GFR calc Af Amer: 23 mL/min — ABNORMAL LOW (ref >60)
GFR, EST NON AFRICAN AMERICAN: 20 mL/min — AB (ref >60)
GLUCOSE: 70 mg/dL (ref 65–99)
Potassium: 3.9 mmol/L (ref 3.5–5.1)
Sodium: 137 mmol/L (ref 135–145)

## 2016-10-19 LAB — BRAIN NATRIURETIC PEPTIDE: B Natriuretic Peptide: 241.1 pg/mL — ABNORMAL HIGH (ref 0.0–100.0)

## 2016-10-19 NOTE — Progress Notes (Signed)
ADVANCED HF CLINIC NOTE  Patient ID: Shelia Woods, female   DOB: 02-10-1931, 83 y.o.   MRN: [id number] PCP: Dr. Shelda Altes Cardiology: Dr. Aundra Dubin  Shelia Woods is an 83 y/o woman with h/o obesity, HTN, CKD stage 3-4, PAF, diabetes and chronic diastolic CHF.   She had a nuclear stress test in 2007 that showed EF 69% and no ischemia. She has never has a cath. Renal dopplers in 2009 showed no RAS.   She does not ambulate much due to her severe back pain and uses a walker or wheelchair.   Has a h/o PAF. Event monitor in 10/16 showed 1 run of atrial fibrillation.  She is on apixaban 2.5 mg bid.   She presents today for follow up. At last visit had take 1 dose of metolazone. Weight up 2 lbs from last visit.  Weight at home down to 217 lbs. Hasn't needed any further metolazone. BP at home 120-140s. Breathing doing well.  Limited mobility at baseline, but SOB improved. Sleeps in a chair chronically. Denies lightheadedness or dizziness. Limiting fluid and salt intake. Taking all medications as directed. No chest pain. Is having intermittent episodes of Afib. Now able to get on compression hose (previously cut into skin too much)  Labs (10/16): LDL 89, K 4.1, creatinine 1.71, hgb 11.3 Labs (2/17): K 3.7, creatinine 1.58, LDL 93, TGs 285 Labs (8/17): K 4.4, creatinine 2.33 Labs (9/17): K 3.7, creatinine 2.04 Labs (10/17): K 3.6, creatinine 2.01, hgb 10.3 Labs (3/18): K 3.5, creatinine 1.68 Labs 10/01/2016: K 3.5 Creatinine 1.99  PMH: 1. Obesity 2. HTN: Renal artery dopplers (2009) with no renal artery stenosis. 3. CKD III-IV 4. Type II diabetes 5. Atrial fibrillation: Paroxysmal.  Event monitor (10/16) showed average HR 53 with 1 brief run of atrial fibrillation (controlled rate).  - Holter (11/17): rare PACs and PVCs.   6. Cardiolite (2007) with EF 69%, no ischemia.  7. Hyperlipidemia 8. Chronic diastolic CHF: Echo (81/44) with EF 60-65%, mild LVH.  Echo (11/16) with EF 60-65%, moderate LVH.    9. Low back pain: Severe and debilitating.  10. Anemia of chronic disease/renal disease  Review of systems complete and found to be negative unless listed in HPI.    Social History   Social History  . Marital status: Married    Spouse name: N/A  . Number of children: 4  . Years of education: N/A   Occupational History  .  Retired    retired/disabled (1994)   Social History Main Topics  . Smoking status: Former Smoker    Quit date: 04/27/1978  . Smokeless tobacco: Never Used  . Alcohol use No  . Drug use: No  . Sexual activity: Not on file   Other Topics Concern  . Not on file   Social History Narrative   Four children   Regular exercise-no    Family History  Problem Relation Age of Onset  . Diabetes Sister   . Parkinsonism Sister     Current Outpatient Prescriptions  Medication Sig Dispense Refill  . acetaminophen (TYLENOL) 500 MG tablet Take 1,000 mg by mouth 3 (three) times daily.     Marland Kitchen atorvastatin (LIPITOR) 40 MG tablet TAKE 1 TABLET(40 MG) BY MOUTH AT BEDTIME 90 tablet 1  . Blood Glucose Monitoring Suppl (ONE TOUCH ULTRA SYSTEM KIT) W/DEVICE KIT 1 kit by Does not apply route once. E11.9 1 each 0  . bumetanide (BUMEX) 1 MG tablet Take 2 tablets (2 mg total) by mouth 2 (  two) times daily. 120 tablet 3  . cloNIDine (CATAPRES) 0.3 MG tablet Take 1.5 tablets (0.45 mg total) by mouth 3 (three) times daily. 270 tablet 1  . ELIQUIS 2.5 MG TABS tablet TAKE 1 TABLET BY MOUTH TWICE DAILY 60 tablet 5  . fenofibrate (TRICOR) 48 MG tablet Take 1 tablet (48 mg total) by mouth daily. 90 tablet 3  . fluocinonide cream (LIDEX) 0.05 % APPLY EXTERNALLY TO THE AFFECTED AREA TWICE DAILY 120 g 0  . Glucosamine-Chondroitin (OSTEO BI-FLEX REGULAR STRENGTH) 250-200 MG TABS Take 1 tablet by mouth 2 (two) times daily.     . hydrALAZINE (APRESOLINE) 100 MG tablet Take 1 tablet (100 mg total) by mouth 3 (three) times daily. 90 tablet 6  . insulin NPH Human (HUMULIN N,NOVOLIN N) 100 UNIT/ML  injection Inject 0.3 mLs (30 Units total) into the skin at bedtime. 3 vial 3  . insulin regular (NOVOLIN R,HUMULIN R) 100 units/mL injection 15 units in the morning, 15 units midday and 20 units in the evening (Patient taking differently: 15 units in the morning, 15 units midday and 15 units in the evening) 10 mL 2  . isosorbide mononitrate (IMDUR) 30 MG 24 hr tablet TAKE 1 TABLET BY MOUTH DAILY 90 tablet 3  . loratadine (CLARITIN) 10 MG tablet Take 10 mg by mouth daily as needed for allergies.     Marland Kitchen losartan (COZAAR) 100 MG tablet TAKE 1 TABLET(100 MG) BY MOUTH DAILY 30 tablet 11  . Melatonin 1 MG TABS Take 1 mg by mouth at bedtime.    . metolazone (ZAROXOLYN) 2.5 MG tablet Take 1 tablet (2.5 mg total) by mouth as directed. Call CHF clinic before taking. 5 tablet 0  . Multiple Vitamins-Minerals (PRESERVISION AREDS 2 PO) Take 1 capsule by mouth 2 (two) times daily.     . nebivolol (BYSTOLIC) 5 MG tablet Take 0.5 tablets (2.5 mg total) by mouth daily. 30 tablet 3  . NIFEdipine (PROCARDIA XL/ADALAT-CC) 60 MG 24 hr tablet Take 2 tablets (120 mg total) by mouth every morning. 180 tablet 1  . ONE TOUCH ULTRA TEST test strip TEST TWICE DAILY AS DIRECTED 100 each 0  . ONETOUCH DELICA LANCETS 98P MISC Use one lancet each time sugars are checked. Pt tests twice daily. 100 each 12  . ONETOUCH DELICA LANCETS FINE MISC USE TWICE DAILY 100 each 11  . polyethylene glycol (MIRALAX / GLYCOLAX) packet Take 17 g by mouth 2 (two) times daily as needed for mild constipation.    Marland Kitchen spironolactone (ALDACTONE) 25 MG tablet Take 0.5 tablets (12.5 mg total) by mouth daily. 15 tablet 3  . traMADol (ULTRAM) 50 MG tablet TAKE 1/2-1 TABLET BY MOUTH EVERY 6 HOURS AS NEEDED FOR MODERATE PAIN 60 tablet 0   No current facility-administered medications for this encounter.    Vitals:   10/19/16 1538  BP: (!) 137/46  Pulse: 65  SpO2: 97%  Weight: 223 lb 9.6 oz (101.4 kg)    Wt Readings from Last 3 Encounters:  10/19/16 223  lb 9.6 oz (101.4 kg)  10/19/16 223 lb 9.6 oz (101.4 kg)  10/12/16 218 lb (98.9 kg)   PHYSICAL EXAM: General: Elderly. NAD in WC. DIL present.  HEENT: Normal Neck: Supple. JVP 7-8 cm. Carotids 2+ bilat; no bruits. No thyromegaly or nodule noted. Cor: PMI nondisplaced. RRR, No M/G/R noted Lungs: CTAB, normal effort. Abdomen: Soft, non-tender, non-distended, no HSM. No bruits or masses. +BS  Extremities: No cyanosis, clubbing, or rash. 1-2+ chronic edema to  knees.  Neuro: Alert & orientedx3, cranial nerves grossly intact. moves all 4 extremities w/o difficulty. Affect pleasant   ASSESSMENT & PLAN: 1. Chest pain: She has multiple RFs for CAD, so it is certainly possible that her prior chest tightness was due to angina.   - Denies CP. No change to medical regimen.  - Continue Imdur 30 daily, no aspirin due to apixaban use - Continue statin 2. Atrial fibrillation: Paroxysmal. -  Regular on exam. Denies bleeding on Eliquis.  3. Hyperlipidemia:  - LDL 64 - Triglycerides 396, continue fenofibrate.  - Continue statin. No change.  4. HTN:  - BP somewhat improved with addition of spiro. Will not up-titrate with tenuous CKD.  - Meds as above.  - Continue bystolic 2.5 mg. She is intolerant to up-titration with fatigue and depression.  5. Chronic diastolic CHF:  - NYHA III at baseline. Limited mobility with back pain.  - Volume status stable.  - Continue bumex 2 mg BID. Can consider switch to torsemide if fluid begins to re-accumulate - Continue metolazone 2.5 mg AS NEEDED. Call if weight or symptoms worsen.  - Continue 12.5 mg spiro daily. BMET/BNP today. Will forward to Dr. Joelyn Oms.  6. CKD: Stage III. Sees Dr Joelyn Oms on July 9th.  - Creatinine baseline 2-2.3.  - BMET today. Will forward labs to Dr. Joelyn Oms office.  Follow up 6-8 weeks. Labs today.  Meds as above.   Shirley Friar, PA-C   10/19/2016

## 2016-10-19 NOTE — Progress Notes (Signed)
Paramedicine Encounter   Patient ID: Shelia Woods , female,   DOB: July 04, 1930,83 y.o.,  MRN: [id number]   Met patient in clinic today with provider.  Time spent with patient 15 minutes No changes at this time.   Jacquiline Doe, EMT-Parmedic 10/19/2016   ACTION: Home visit completed Next visit planned for 1 week

## 2016-10-19 NOTE — Patient Instructions (Signed)
Routine lab work today. Will notify you of abnormal results, otherwise no news is good news!  No changes to medication at this time.  Follow up 2 months with Dr. McLean.  Take all medication as prescribed the day of your appointment. Bring all medications with you to your appointment.  Do the following things EVERYDAY: 1) Weigh yourself in the morning before breakfast. Write it down and keep it in a log. 2) Take your medicines as prescribed 3) Eat low salt foods-Limit salt (sodium) to 2000 mg per day.  4) Stay as active as you can everyday 5) Limit all fluids for the day to less than 2 liters  

## 2016-10-21 DIAGNOSIS — I5032 Chronic diastolic (congestive) heart failure: Secondary | ICD-10-CM | POA: Diagnosis not present

## 2016-10-21 DIAGNOSIS — N183 Chronic kidney disease, stage 3 (moderate): Secondary | ICD-10-CM | POA: Diagnosis not present

## 2016-10-21 DIAGNOSIS — I130 Hypertensive heart and chronic kidney disease with heart failure and stage 1 through stage 4 chronic kidney disease, or unspecified chronic kidney disease: Secondary | ICD-10-CM | POA: Diagnosis not present

## 2016-10-26 ENCOUNTER — Other Ambulatory Visit (HOSPITAL_COMMUNITY): Payer: Self-pay | Admitting: Pharmacist

## 2016-10-26 ENCOUNTER — Other Ambulatory Visit (HOSPITAL_COMMUNITY): Payer: Self-pay

## 2016-10-26 DIAGNOSIS — I5032 Chronic diastolic (congestive) heart failure: Secondary | ICD-10-CM

## 2016-10-26 MED ORDER — CLONIDINE HCL 0.3 MG PO TABS: ORAL_TABLET | ORAL | 5 refills | Status: DC

## 2016-10-26 NOTE — Progress Notes (Signed)
Paramedicine Encounter    Patient ID: Shelia Woods, female    DOB: Jan 15, 1931, 83 y.o.   MRN: [id number]   Patient Care Team: Larey Dresser, MD as Consulting Physician (Cardiology)  Patient Active Problem List   Diagnosis Date Noted  . Chronic diastolic CHF (congestive heart failure) ([place]) 10/20/2015  . Chest pressure 01/23/2015  . PAF (paroxysmal atrial fibrillation) ([place]) 01/23/2015  . Abdominal pain 11/08/2013  . CKD (chronic kidney disease), stage III 11/08/2013  . Colitis 11/08/2013  . General medical examination 01/13/2011  . MUSCLE SPASM, TRAPEZIUS 04/02/2010  . WOUND, OPEN, ELBOW 04/02/2010  . CONTUSION, HEAD 04/02/2010  . CONTUSION OF CHEST WALL 04/02/2010  . CONTUSION, ELBOW 04/02/2010  . CANDIDIASIS, SKIN 01/02/2010  . MACULAR DEGENERATION, SENILE, NONEXUDATIVE 01/01/2010  . CATARACTS, SENILE, NUCLEAR, BILATERAL 01/01/2010  . UTI 11/11/2009  . EDEMA- LOCALIZED 09/11/2009  . DIZZINESS 08/08/2009  . SMALL BOWEL OBSTRUCTION 11/05/2008  . PAROXYSMAL ATRIAL FIBRILLATION 09/29/2008  . RHINITIS 09/06/2008  . PROTEINURIA 05/17/2008  . Chronic kidney disease 04/10/2008  . INGROWN TOENAIL, INFECTED 04/10/2008  . DERMATOPHYTOSIS OF THE BODY 11/30/2007  . HYPERKALEMIA 11/30/2007  . RENAL FAILURE 11/22/2007  . FATIGUE 11/22/2007  . UNSPECIFIED CONSTIPATION 03/01/2007  . NEOP, MALIGNANT, SKIN NOS 11/08/2006  . SYNDROME, CARPAL TUNNEL 11/08/2006  . PERIPHERAL NEUROPATHY, LOWER EXTREMITY 11/08/2006  . Type 2 diabetes, controlled, with neuropathy ([place]) 06/24/2006  . HYPERCHOLESTEROLEMIA 06/24/2006  . HYPERTENSION, BENIGN SYSTEMIC 06/24/2006  . Osteoarthrosis involving lower leg 06/24/2006  . BACK PAIN, LOW 06/24/2006    Current Outpatient Prescriptions:  .  acetaminophen (TYLENOL) 500 MG tablet, Take 1,000 mg by mouth 3 (three) times daily. , Disp: , Rfl:  .  atorvastatin (LIPITOR) 40 MG tablet, TAKE 1 TABLET(40 MG) BY MOUTH AT BEDTIME, Disp: 90 tablet, Rfl: 1 .   Blood Glucose Monitoring Suppl (ONE TOUCH ULTRA SYSTEM KIT) W/DEVICE KIT, 1 kit by Does not apply route once. E11.9, Disp: 1 each, Rfl: 0 .  bumetanide (BUMEX) 1 MG tablet, Take 2 tablets (2 mg total) by mouth 2 (two) times daily., Disp: 120 tablet, Rfl: 3 .  cloNIDine (CATAPRES) 0.3 MG tablet, Take 1.5 tablets (0.45 mg total) by mouth 3 (three) times daily., Disp: 135 tablet, Rfl: 5 .  ELIQUIS 2.5 MG TABS tablet, TAKE 1 TABLET BY MOUTH TWICE DAILY, Disp: 60 tablet, Rfl: 5 .  fenofibrate (TRICOR) 48 MG tablet, Take 1 tablet (48 mg total) by mouth daily., Disp: 90 tablet, Rfl: 3 .  Glucosamine-Chondroitin (OSTEO BI-FLEX REGULAR STRENGTH) 250-200 MG TABS, Take 1 tablet by mouth 2 (two) times daily. , Disp: , Rfl:  .  hydrALAZINE (APRESOLINE) 100 MG tablet, Take 1 tablet (100 mg total) by mouth 3 (three) times daily., Disp: 90 tablet, Rfl: 6 .  insulin NPH Human (HUMULIN N,NOVOLIN N) 100 UNIT/ML injection, Inject 0.3 mLs (30 Units total) into the skin at bedtime., Disp: 3 vial, Rfl: 3 .  insulin regular (NOVOLIN R,HUMULIN R) 100 units/mL injection, 15 units in the morning, 15 units midday and 20 units in the evening (Patient taking differently: 15 units in the morning, 15 units midday and 15 units in the evening), Disp: 10 mL, Rfl: 2 .  isosorbide mononitrate (IMDUR) 30 MG 24 hr tablet, TAKE 1 TABLET BY MOUTH DAILY, Disp: 90 tablet, Rfl: 3 .  loratadine (CLARITIN) 10 MG tablet, Take 10 mg by mouth daily as needed for allergies. , Disp: , Rfl:  .  losartan (COZAAR) 100 MG tablet, TAKE 1  TABLET(100 MG) BY MOUTH DAILY, Disp: 30 tablet, Rfl: 11 .  metolazone (ZAROXOLYN) 2.5 MG tablet, Take 1 tablet (2.5 mg total) by mouth as directed. Call CHF clinic before taking., Disp: 5 tablet, Rfl: 0 .  Multiple Vitamins-Minerals (PRESERVISION AREDS 2 PO), Take 1 capsule by mouth 2 (two) times daily. , Disp: , Rfl:  .  nebivolol (BYSTOLIC) 5 MG tablet, Take 0.5 tablets (2.5 mg total) by mouth daily., Disp: 30 tablet,  Rfl: 3 .  NIFEdipine (PROCARDIA XL/ADALAT-CC) 60 MG 24 hr tablet, Take 2 tablets (120 mg total) by mouth every morning., Disp: 180 tablet, Rfl: 1 .  ONE TOUCH ULTRA TEST test strip, TEST TWICE DAILY AS DIRECTED, Disp: 100 each, Rfl: 0 .  ONETOUCH DELICA LANCETS 16S MISC, Use one lancet each time sugars are checked. Pt tests twice daily., Disp: 100 each, Rfl: 12 .  ONETOUCH DELICA LANCETS FINE MISC, USE TWICE DAILY, Disp: 100 each, Rfl: 11 .  polyethylene glycol (MIRALAX / GLYCOLAX) packet, Take 17 g by mouth 2 (two) times daily as needed for mild constipation., Disp: , Rfl:  .  spironolactone (ALDACTONE) 25 MG tablet, Take 0.5 tablets (12.5 mg total) by mouth daily., Disp: 15 tablet, Rfl: 3 .  traMADol (ULTRAM) 50 MG tablet, TAKE 1/2-1 TABLET BY MOUTH EVERY 6 HOURS AS NEEDED FOR MODERATE PAIN, Disp: 60 tablet, Rfl: 0 .  fluocinonide cream (LIDEX) 0.05 %, APPLY EXTERNALLY TO THE AFFECTED AREA TWICE DAILY (Patient not taking: Reported on 10/26/2016), Disp: 120 g, Rfl: 0 .  Melatonin 1 MG TABS, Take 1 mg by mouth at bedtime., Disp: , Rfl:  Allergies  Allergen Reactions  . Aranesp (Albumin Free) [Darbepoetin Alfa] Other (See Comments)    Headaches, lower extremity edema, nausea   . Beta Adrenergic Blockers     REACTION: depression  . Cephalexin Other (See Comments)    unknown  . Codone [Hydrocodone] Nausea And Vomiting  . Darvon [Propoxyphene]     dizziness  . Penicillins     REACTION: hives/intestinal tract  . Warfarin Sodium     REACTION: bleeding on skin surface  . Latex Rash     Social History   Social History  . Marital status: Married    Spouse name: N/A  . Number of children: 4  . Years of education: N/A   Occupational History  .  Retired    retired/disabled (1994)   Social History Main Topics  . Smoking status: Former Smoker    Quit date: 04/27/1978  . Smokeless tobacco: Never Used  . Alcohol use No  . Drug use: No  . Sexual activity: Not on file   Other Topics  Concern  . Not on file   Social History Narrative   Four children   Regular exercise-no    Physical Exam  Constitutional: She appears well-developed.  Neck: Normal range of motion.  Cardiovascular: Normal rate.   Pulmonary/Chest: Effort normal and breath sounds normal. No respiratory distress. She has no wheezes. She has no rales.  Abdominal: Soft. She exhibits no distension.  Musculoskeletal: Normal range of motion. She exhibits edema.  Skin: Skin is warm and dry.        SAFE - 10/12/16 1200      Situation   Admitting diagnosis chf   Heart failure history Exisiting   Comorbidities Atrial fibillation;CKD/renal insufficiency;DM;HTN   Readmitted within 30 days No   Hospital admission within past 12 months No     Assessment   Lives alone No  Primary support person Shelia Woods (Husband)   Mode of transportation personal car   Other services involved None   Home equipement Cane;Scale;Walker;Wheelchair     [employer]   Weighs self daily Yes   Scale provided No   Records on weight chart Yes     Resources   Has "Living better w/heart failure" book Yes   Has HF Zone tool Yes   Able to identify yellow zone signs/when to call MD Yes   Records zone daily No     Medications   Uses a pill box Yes   Who stocks the pill box Paramedicine   Pill box checked this visit Yes   Pill box refilled this visit Yes   Difficulty obtaining medications No   Mail order medications No   Missed one or more doses of medications per week No     Nutrition   Patient receives meals on wheels No   Patient follows low sodium diet Yes   Has foods at home that meet the current recommended diet Yes   Patient follows low sugar/card diet Yes   Nutritional concerns/issues None     Activity Level   ADL's/Mobility Independent   How many feet can patient ambulate 30 feet     Urine   Difficulty urinating No   Changes in urine None     Time spent with patient   Time spent with patient  60 Minutes         Future Appointments Date Time Provider [place]  12/21/2016 2:40 PM Larey Dresser, MD [facility] None   BP (!) 160/70 (BP Location: Right Arm, Patient Position: Sitting, Cuff Size: Normal)   Pulse 61   Resp 16   SpO2 97%  Weight yesterday- N/A Last visit weight- 223 lbs Last reliable weight is 219 lbs  Ms Cottam was seen at home today and reports feeling generally well. She denied SOB, H/A or dizziness but maintains bilateral lower extremity edema. She says she believes it is getting better and was wearing compression stockings today. She had already filled her pillbox and was just waiting for me to bring spironolactone and clonidine from the pharmacy. Her other medications were verified. She advised me the Dr Aundra Dubin had contacted [facility] and a nurse would be coming to see her twice a week and she did not believe there would be a need for me to continue my visits. I will confirm with the clinic as soon as possible.  Jacquiline Doe, EMT 10/26/16  ACTION: Home visit completed

## 2016-11-09 ENCOUNTER — Other Ambulatory Visit (HOSPITAL_COMMUNITY): Payer: Self-pay | Admitting: *Deleted

## 2016-11-09 MED ORDER — APIXABAN 2.5 MG PO TABS: 2.5000 mg | ORAL_TABLET | Freq: Two times a day (BID) | ORAL | 5 refills | Status: DC

## 2016-11-24 ENCOUNTER — Other Ambulatory Visit (HOSPITAL_COMMUNITY): Payer: Self-pay | Admitting: Cardiology

## 2016-11-24 MED ORDER — NIFEDIPINE ER OSMOTIC RELEASE 60 MG PO TB24: 120.0000 mg | ORAL_TABLET | Freq: Every morning | ORAL | 1 refills | Status: DC

## 2016-12-07 ENCOUNTER — Telehealth (HOSPITAL_COMMUNITY): Payer: Self-pay

## 2016-12-07 NOTE — Telephone Encounter (Signed)
Shelia Woods was called today to schedule an appointment. She advised that today would not work because she was having a refrigerator delivered but Friday after 14:00 would be good.

## 2016-12-11 ENCOUNTER — Other Ambulatory Visit (HOSPITAL_COMMUNITY): Payer: Self-pay

## 2016-12-11 NOTE — Progress Notes (Signed)
Paramedicine Encounter    Patient ID: Shelia Woods, female    DOB: 01-27-1931, 83 y.o.   MRN: [id number]   Patient Care Team: Larey Dresser, MD as Consulting Physician (Cardiology)  Patient Active Problem List   Diagnosis Date Noted  . Chronic diastolic CHF (congestive heart failure) ([place]) 10/20/2015  . Chest pressure 01/23/2015  . PAF (paroxysmal atrial fibrillation) ([place]) 01/23/2015  . Abdominal pain 11/08/2013  . CKD (chronic kidney disease), stage III 11/08/2013  . Colitis 11/08/2013  . General medical examination 01/13/2011  . MUSCLE SPASM, TRAPEZIUS 04/02/2010  . WOUND, OPEN, ELBOW 04/02/2010  . CONTUSION, HEAD 04/02/2010  . CONTUSION OF CHEST WALL 04/02/2010  . CONTUSION, ELBOW 04/02/2010  . CANDIDIASIS, SKIN 01/02/2010  . MACULAR DEGENERATION, SENILE, NONEXUDATIVE 01/01/2010  . CATARACTS, SENILE, NUCLEAR, BILATERAL 01/01/2010  . UTI 11/11/2009  . EDEMA- LOCALIZED 09/11/2009  . DIZZINESS 08/08/2009  . SMALL BOWEL OBSTRUCTION 11/05/2008  . PAROXYSMAL ATRIAL FIBRILLATION 09/29/2008  . RHINITIS 09/06/2008  . PROTEINURIA 05/17/2008  . Chronic kidney disease 04/10/2008  . INGROWN TOENAIL, INFECTED 04/10/2008  . DERMATOPHYTOSIS OF THE BODY 11/30/2007  . HYPERKALEMIA 11/30/2007  . RENAL FAILURE 11/22/2007  . FATIGUE 11/22/2007  . UNSPECIFIED CONSTIPATION 03/01/2007  . NEOP, MALIGNANT, SKIN NOS 11/08/2006  . SYNDROME, CARPAL TUNNEL 11/08/2006  . PERIPHERAL NEUROPATHY, LOWER EXTREMITY 11/08/2006  . Type 2 diabetes, controlled, with neuropathy ([place]) 06/24/2006  . HYPERCHOLESTEROLEMIA 06/24/2006  . HYPERTENSION, BENIGN SYSTEMIC 06/24/2006  . Osteoarthrosis involving lower leg 06/24/2006  . BACK PAIN, LOW 06/24/2006    Current Outpatient Prescriptions:  .  acetaminophen (TYLENOL) 500 MG tablet, Take 1,000 mg by mouth 3 (three) times daily. , Disp: , Rfl:  .  apixaban (ELIQUIS) 2.5 MG TABS tablet, Take 1 tablet (2.5 mg total) by mouth 2 (two) times daily., Disp: 60  tablet, Rfl: 5 .  atorvastatin (LIPITOR) 40 MG tablet, TAKE 1 TABLET(40 MG) BY MOUTH AT BEDTIME, Disp: 90 tablet, Rfl: 1 .  Blood Glucose Monitoring Suppl (ONE TOUCH ULTRA SYSTEM KIT) W/DEVICE KIT, 1 kit by Does not apply route once. E11.9, Disp: 1 each, Rfl: 0 .  bumetanide (BUMEX) 1 MG tablet, Take 2 tablets (2 mg total) by mouth 2 (two) times daily., Disp: 120 tablet, Rfl: 3 .  cloNIDine (CATAPRES) 0.3 MG tablet, Take 1.5 tablets (0.45 mg total) by mouth 3 (three) times daily., Disp: 135 tablet, Rfl: 5 .  fenofibrate (TRICOR) 48 MG tablet, Take 1 tablet (48 mg total) by mouth daily., Disp: 90 tablet, Rfl: 3 .  Glucosamine-Chondroitin (OSTEO BI-FLEX REGULAR STRENGTH) 250-200 MG TABS, Take 1 tablet by mouth 2 (two) times daily. , Disp: , Rfl:  .  hydrALAZINE (APRESOLINE) 100 MG tablet, Take 1 tablet (100 mg total) by mouth 3 (three) times daily., Disp: 90 tablet, Rfl: 6 .  insulin NPH Human (HUMULIN N,NOVOLIN N) 100 UNIT/ML injection, Inject 0.3 mLs (30 Units total) into the skin at bedtime., Disp: 3 vial, Rfl: 3 .  insulin regular (NOVOLIN R,HUMULIN R) 100 units/mL injection, 15 units in the morning, 15 units midday and 20 units in the evening (Patient taking differently: 15 units in the morning, 15 units midday and 15 units in the evening), Disp: 10 mL, Rfl: 2 .  isosorbide mononitrate (IMDUR) 30 MG 24 hr tablet, TAKE 1 TABLET BY MOUTH DAILY, Disp: 90 tablet, Rfl: 3 .  loratadine (CLARITIN) 10 MG tablet, Take 10 mg by mouth daily as needed for allergies. , Disp: , Rfl:  .  losartan (  COZAAR) 100 MG tablet, TAKE 1 TABLET(100 MG) BY MOUTH DAILY, Disp: 30 tablet, Rfl: 11 .  Melatonin 1 MG TABS, Take 1 mg by mouth at bedtime., Disp: , Rfl:  .  metolazone (ZAROXOLYN) 2.5 MG tablet, Take 1 tablet (2.5 mg total) by mouth as directed. Call CHF clinic before taking., Disp: 5 tablet, Rfl: 0 .  Multiple Vitamins-Minerals (PRESERVISION AREDS 2 PO), Take 1 capsule by mouth 2 (two) times daily. , Disp: , Rfl:  .   nebivolol (BYSTOLIC) 5 MG tablet, Take 0.5 tablets (2.5 mg total) by mouth daily., Disp: 30 tablet, Rfl: 3 .  NIFEdipine (PROCARDIA XL/ADALAT-CC) 60 MG 24 hr tablet, Take 2 tablets (120 mg total) by mouth every morning., Disp: 180 tablet, Rfl: 1 .  ONE TOUCH ULTRA TEST test strip, TEST TWICE DAILY AS DIRECTED, Disp: 100 each, Rfl: 0 .  ONETOUCH DELICA LANCETS 34H MISC, Use one lancet each time sugars are checked. Pt tests twice daily., Disp: 100 each, Rfl: 12 .  ONETOUCH DELICA LANCETS FINE MISC, USE TWICE DAILY, Disp: 100 each, Rfl: 11 .  polyethylene glycol (MIRALAX / GLYCOLAX) packet, Take 17 g by mouth 2 (two) times daily as needed for mild constipation., Disp: , Rfl:  .  spironolactone (ALDACTONE) 25 MG tablet, Take 0.5 tablets (12.5 mg total) by mouth daily., Disp: 15 tablet, Rfl: 3 .  traMADol (ULTRAM) 50 MG tablet, TAKE 1/2-1 TABLET BY MOUTH EVERY 6 HOURS AS NEEDED FOR MODERATE PAIN, Disp: 60 tablet, Rfl: 0 .  fluocinonide cream (LIDEX) 0.05 %, APPLY EXTERNALLY TO THE AFFECTED AREA TWICE DAILY (Patient not taking: Reported on 10/26/2016), Disp: 120 g, Rfl: 0 Allergies  Allergen Reactions  . Aranesp (Albumin Free) [Darbepoetin Alfa] Other (See Comments)    Headaches, lower extremity edema, nausea   . Beta Adrenergic Blockers     REACTION: depression  . Cephalexin Other (See Comments)    unknown  . Codone [Hydrocodone] Nausea And Vomiting  . Darvon [Propoxyphene]     dizziness  . Penicillins     REACTION: hives/intestinal tract  . Warfarin Sodium     REACTION: bleeding on skin surface  . Latex Rash     Social History   Social History  . Marital status: Married    Spouse name: N/A  . Number of children: 4  . Years of education: N/A   Occupational History  .  Retired    retired/disabled (1994)   Social History Main Topics  . Smoking status: Former Smoker    Quit date: 04/27/1978  . Smokeless tobacco: Never Used  . Alcohol use No  . Drug use: No  . Sexual activity: Not  on file   Other Topics Concern  . Not on file   Social History Narrative   Four children   Regular exercise-no    Physical Exam  Constitutional: She is oriented to person, place, and time.  Cardiovascular: Normal rate and regular rhythm.   Pulmonary/Chest: Effort normal and breath sounds normal. No respiratory distress. She has no wheezes. She has no rales.  Abdominal: Soft. She exhibits no distension.  Musculoskeletal: Normal range of motion. She exhibits edema.  Neurological: She is alert and oriented to person, place, and time.  Skin: Skin is warm and dry.  Psychiatric: She has a normal mood and affect.        SAFE - 10/12/16 1200      Situation   Admitting diagnosis chf   Heart failure history Exisiting   Comorbidities Atrial  fibillation;CKD/renal insufficiency;DM;HTN   Readmitted within 30 days No   Hospital admission within past 12 months No     Assessment   Lives alone No   Primary support person Juanda Crumble (Husband)   Mode of transportation personal car   Other services involved None   Home equipement Cane;Scale;Walker;Wheelchair     [employer]   Weighs self daily Yes   Scale provided No   Records on weight chart Yes     Resources   Has "Living better w/heart failure" book Yes   Has HF Zone tool Yes   Able to identify yellow zone signs/when to call MD Yes   Records zone daily No     Medications   Uses a pill box Yes   Who stocks the pill box Paramedicine   Pill box checked this visit Yes   Pill box refilled this visit Yes   Difficulty obtaining medications No   Mail order medications No   Missed one or more doses of medications per week No     Nutrition   Patient receives meals on wheels No   Patient follows low sodium diet Yes   Has foods at home that meet the current recommended diet Yes   Patient follows low sugar/card diet Yes   Nutritional concerns/issues None     Activity Level   ADL's/Mobility Independent   How many feet can patient ambulate  30 feet     Urine   Difficulty urinating No   Changes in urine None     Time spent with patient   Time spent with patient  60 Minutes        Future Appointments Date Time Provider [place]  12/21/2016 2:40 PM Larey Dresser, MD [facility] None   BP 130/60 (BP Location: Right Arm, Patient Position: Sitting, Cuff Size: Large)   Pulse 60   Resp 16   Wt 206 lb (93.4 kg)   SpO2 98%   BMI 33.25 kg/m  Weight yesterday- 207 lbs Last visit weight- Unable to obtain  Mrs. Hegna was seen at home today and reports feeling well. She denied SOB, H/A, or dizziness. She sleeps in a recliner due to chronic back pain. Her lower extremity edema is significantly decreased since the last visit. She reports being compliant with her medications and continues to fill her pillbox without assistance. She was discharged by home health yesterday and feels comfortable with that but at the request of the HF clinic I will continue my visits every two weeks.   Time spent with patient: 24 minutes  Jacquiline Doe, EMT 12/11/16  ACTION: Home visit completed Next visit planned for 2 weeks

## 2016-12-21 ENCOUNTER — Ambulatory Visit (HOSPITAL_COMMUNITY)
Admission: RE | Admit: 2016-12-21 | Discharge: 2016-12-21 | Disposition: A | Discharge status: Home / Self Care | Payer: Medicare Other | Source: Ambulatory Visit | Attending: Cardiology | Admitting: Cardiology

## 2016-12-21 ENCOUNTER — Encounter (HOSPITAL_COMMUNITY): Payer: Self-pay | Admitting: Cardiology

## 2016-12-21 VITALS — BP 156/72 | HR 63 | Wt 207.5 lb

## 2016-12-21 DIAGNOSIS — I5032 Chronic diastolic (congestive) heart failure: Secondary | ICD-10-CM | POA: Insufficient documentation

## 2016-12-21 DIAGNOSIS — D631 Anemia in chronic kidney disease: Secondary | ICD-10-CM | POA: Insufficient documentation

## 2016-12-21 DIAGNOSIS — I2510 Atherosclerotic heart disease of native coronary artery without angina pectoris: Secondary | ICD-10-CM | POA: Insufficient documentation

## 2016-12-21 DIAGNOSIS — E669 Obesity, unspecified: Secondary | ICD-10-CM | POA: Insufficient documentation

## 2016-12-21 DIAGNOSIS — Z833 Family history of diabetes mellitus: Secondary | ICD-10-CM | POA: Diagnosis not present

## 2016-12-21 DIAGNOSIS — Z79899 Other long term (current) drug therapy: Secondary | ICD-10-CM | POA: Diagnosis not present

## 2016-12-21 DIAGNOSIS — I480 Paroxysmal atrial fibrillation: Secondary | ICD-10-CM | POA: Diagnosis not present

## 2016-12-21 DIAGNOSIS — I10 Essential (primary) hypertension: Secondary | ICD-10-CM

## 2016-12-21 DIAGNOSIS — N183 Chronic kidney disease, stage 3 unspecified: Secondary | ICD-10-CM

## 2016-12-21 DIAGNOSIS — Z6833 Body mass index (BMI) 33.0-33.9, adult: Secondary | ICD-10-CM | POA: Diagnosis not present

## 2016-12-21 DIAGNOSIS — I130 Hypertensive heart and chronic kidney disease with heart failure and stage 1 through stage 4 chronic kidney disease, or unspecified chronic kidney disease: Secondary | ICD-10-CM | POA: Insufficient documentation

## 2016-12-21 DIAGNOSIS — M545 Low back pain: Secondary | ICD-10-CM | POA: Diagnosis not present

## 2016-12-21 DIAGNOSIS — Z794 Long term (current) use of insulin: Secondary | ICD-10-CM | POA: Insufficient documentation

## 2016-12-21 DIAGNOSIS — Z7901 Long term (current) use of anticoagulants: Secondary | ICD-10-CM | POA: Diagnosis not present

## 2016-12-21 DIAGNOSIS — E1122 Type 2 diabetes mellitus with diabetic chronic kidney disease: Secondary | ICD-10-CM | POA: Diagnosis not present

## 2016-12-21 DIAGNOSIS — E785 Hyperlipidemia, unspecified: Secondary | ICD-10-CM | POA: Diagnosis not present

## 2016-12-21 DIAGNOSIS — Z87891 Personal history of nicotine dependence: Secondary | ICD-10-CM | POA: Diagnosis not present

## 2016-12-21 LAB — BASIC METABOLIC PANEL
Anion gap: 9 (ref 5–15)
BUN: 54 mg/dL — ABNORMAL HIGH (ref 6–20)
CHLORIDE: 105 mmol/L (ref 101–111)
CO2: 24 mmol/L (ref 22–32)
Calcium: 9.7 mg/dL (ref 8.9–10.3)
Creatinine, Ser: 2.4 mg/dL — ABNORMAL HIGH (ref 0.44–1.00)
GFR, EST AFRICAN AMERICAN: 20 mL/min — AB (ref >60)
GFR, EST NON AFRICAN AMERICAN: 17 mL/min — AB (ref >60)
Glucose, Bld: 87 mg/dL (ref 65–99)
POTASSIUM: 3.9 mmol/L (ref 3.5–5.1)
SODIUM: 138 mmol/L (ref 135–145)

## 2016-12-21 LAB — CBC
HCT: 31.2 % — ABNORMAL LOW (ref 36.0–46.0)
HEMOGLOBIN: 9.8 g/dL — AB (ref 12.0–15.0)
MCH: 29.2 pg (ref 26.0–34.0)
MCHC: 31.4 g/dL (ref 30.0–36.0)
MCV: 92.9 fL (ref 78.0–100.0)
PLATELETS: 380 10*3/uL (ref 150–400)
RBC: 3.36 MIL/uL — AB (ref 3.87–5.11)
RDW: 14.4 % (ref 11.5–15.5)
WBC: 6.3 10*3/uL (ref 4.0–10.5)

## 2016-12-21 MED ORDER — TRAMADOL HCL 50 MG PO TABS: 50.0000 mg | ORAL_TABLET | Freq: Four times a day (QID) | ORAL | 0 refills | Status: DC | PRN

## 2016-12-21 NOTE — Patient Instructions (Signed)
Labs drawn today  Refill 1 time only on Tramadol further refills will need to come from PCP.   Your physician recommends that you schedule a follow-up appointment in: 4 months

## 2016-12-22 NOTE — Progress Notes (Signed)
ADVANCED HF CLINIC NOTE  Patient ID: Shelia Woods, female   DOB: 30-Aug-1930, 83 y.o.   MRN: [id number] PCP: Dr. Shelda Altes Cardiology: Dr. Aundra Dubin  Shelia Woods is an 83 y/o woman with h/o obesity, HTN, CKD stage 3-4, PAF, diabetes.    She had a nuclear stress test in 2007 that showed EF 69% and no ischemia. She has never has a cath. Renal dopplers in 2009 showed no RAS.   She does not ambulate much due to her severe back pain and uses a walker or wheelchair.   Has a h/o PAF. Event monitor in 10/16 showed 1 run of atrial fibrillation.  She is on apixaban 2.5 mg bid.   Weight is down 16 lbs.  Sleeps in chair due to her back. No chest pain. She is tolerating Eliquis without overt bleeding.  She remains very limited by back pain.  She is not walking much with her walker.  No dyspnea with transfers but does get short of breath taking a shower. BP is high in the office today but checks regularly at home and SBP runs 120s-130s.     Labs (10/16): LDL 89, K 4.1, creatinine 1.71, hgb 11.3 Labs (2/17): K 3.7, creatinine 1.58, LDL 93, TGs 285 Labs (8/17): K 4.4, creatinine 2.33 Labs (9/17): K 3.7, creatinine 2.04 Labs (10/17): K 3.6, creatinine 2.01, hgb 10.3 Labs (3/18): K 3.5, creatinine 1.68 Labs (6/18): K 3.9, creatinine 2.15, BNP 241  PMH: 1. Obesity 2. HTN: Renal artery dopplers (2009) with no renal artery stenosis. 3. CKD III-IV 4. Type II diabetes 5. Atrial fibrillation: Paroxysmal.  Event monitor (10/16) showed average HR 53 with 1 brief run of atrial fibrillation (controlled rate).  - Holter (11/17): rare PACs and PVCs.   6. Cardiolite (2007) with EF 69%, no ischemia.  7. Hyperlipidemia 8. Chronic diastolic CHF: Echo (49/82) with EF 60-65%, mild LVH.  Echo (11/16) with EF 60-65%, moderate LVH.  9. Low back pain: Severe and debilitating.  10. Anemia of chronic disease/renal disease  ROS: All systems negative except as listed in HPI, PMH and Problem List.  Social History   Social  History  . Marital status: Married    Spouse name: N/A  . Number of children: 4  . Years of education: N/A   Occupational History  .  Retired    retired/disabled (1994)   Social History Main Topics  . Smoking status: Former Smoker    Quit date: 04/27/1978  . Smokeless tobacco: Never Used  . Alcohol use No  . Drug use: No  . Sexual activity: Not on file   Other Topics Concern  . Not on file   Social History Narrative   Four children   Regular exercise-no    Family History  Problem Relation Age of Onset  . Diabetes Sister   . Parkinsonism Sister     Current Outpatient Prescriptions  Medication Sig Dispense Refill  . acetaminophen (TYLENOL) 500 MG tablet Take 1,000 mg by mouth 3 (three) times daily.     Marland Kitchen apixaban (ELIQUIS) 2.5 MG TABS tablet Take 1 tablet (2.5 mg total) by mouth 2 (two) times daily. 60 tablet 5  . atorvastatin (LIPITOR) 40 MG tablet TAKE 1 TABLET(40 MG) BY MOUTH AT BEDTIME 90 tablet 1  . Blood Glucose Monitoring Suppl (ONE TOUCH ULTRA SYSTEM KIT) W/DEVICE KIT 1 kit by Does not apply route once. E11.9 1 each 0  . bumetanide (BUMEX) 1 MG tablet Take 2 tablets (2 mg total) by mouth  2 (two) times daily. 120 tablet 3  . cloNIDine (CATAPRES) 0.3 MG tablet Take 1.5 tablets (0.45 mg total) by mouth 3 (three) times daily. 135 tablet 5  . fenofibrate (TRICOR) 48 MG tablet Take 1 tablet (48 mg total) by mouth daily. 90 tablet 3  . fluocinonide cream (LIDEX) 0.05 % APPLY EXTERNALLY TO THE AFFECTED AREA TWICE DAILY 120 g 0  . Glucosamine-Chondroitin (OSTEO BI-FLEX REGULAR STRENGTH) 250-200 MG TABS Take 1 tablet by mouth 2 (two) times daily.     . hydrALAZINE (APRESOLINE) 100 MG tablet Take 1 tablet (100 mg total) by mouth 3 (three) times daily. 90 tablet 6  . insulin NPH Human (HUMULIN N,NOVOLIN N) 100 UNIT/ML injection Inject 0.3 mLs (30 Units total) into the skin at bedtime. 3 vial 3  . insulin regular (NOVOLIN R,HUMULIN R) 100 units/mL injection 15 units in the  morning, 15 units midday and 20 units in the evening (Patient taking differently: 15 units in the morning, 15 units midday and 15 units in the evening) 10 mL 2  . isosorbide mononitrate (IMDUR) 30 MG 24 hr tablet TAKE 1 TABLET BY MOUTH DAILY 90 tablet 3  . loratadine (CLARITIN) 10 MG tablet Take 10 mg by mouth daily as needed for allergies.     Marland Kitchen losartan (COZAAR) 100 MG tablet TAKE 1 TABLET(100 MG) BY MOUTH DAILY 30 tablet 11  . Melatonin 1 MG TABS Take 1 mg by mouth at bedtime.    . metolazone (ZAROXOLYN) 2.5 MG tablet Take 1 tablet (2.5 mg total) by mouth as directed. Call CHF clinic before taking. 5 tablet 0  . Multiple Vitamins-Minerals (PRESERVISION AREDS 2 PO) Take 1 capsule by mouth 2 (two) times daily.     . nebivolol (BYSTOLIC) 5 MG tablet Take 0.5 tablets (2.5 mg total) by mouth daily. 30 tablet 3  . NIFEdipine (PROCARDIA XL/ADALAT-CC) 60 MG 24 hr tablet Take 2 tablets (120 mg total) by mouth every morning. 180 tablet 1  . ONE TOUCH ULTRA TEST test strip TEST TWICE DAILY AS DIRECTED 100 each 0  . ONETOUCH DELICA LANCETS 17B MISC Use one lancet each time sugars are checked. Pt tests twice daily. 100 each 12  . ONETOUCH DELICA LANCETS FINE MISC USE TWICE DAILY 100 each 11  . polyethylene glycol (MIRALAX / GLYCOLAX) packet Take 17 g by mouth 2 (two) times daily as needed for mild constipation.    Marland Kitchen spironolactone (ALDACTONE) 25 MG tablet Take 0.5 tablets (12.5 mg total) by mouth daily. 15 tablet 3  . traMADol (ULTRAM) 50 MG tablet Take 1 tablet (50 mg total) by mouth every 6 (six) hours as needed. 60 tablet 0   No current facility-administered medications for this encounter.     Vitals:   12/21/16 1448  BP: (!) 156/72  Pulse: 63  SpO2: 99%  Weight: 207 lb 8 oz (94.1 kg)    PHYSICAL EXAM:  General: NAD, in wheelchair.  Neck: No JVD, no thyromegaly or thyroid nodule.  Lungs: Clear to auscultation bilaterally with normal respiratory effort. CV: Nondisplaced PMI.  Heart regular  S1/S2, no S3/S4, no murmur. Trace ankle edema.  No carotid bruit.  Normal pedal pulses.  Abdomen: Soft, nontender, no hepatosplenomegaly, no distention.  Skin: Intact without lesions or rashes.  Neurologic: Alert and oriented x 3.  Psych: Normal affect. Extremities: No clubbing or cyanosis.  HEENT: Normal.    ASSESSMENT & PLAN: 1. Chest pain: No recent chest pain. She has multiple RFs for CAD, so it is  certainly possible that her prior chest tightness was due to angina.  We had wanted her to have a Cardiolite in 2016 but she cancelled the test.  With no recent chest pain, I think we can hold off on stress test.  Would need very high threshold for coronary angiography given CKD.  - Continue Imdur 30 daily, no aspirin due to apixaban use, continue statin.  2. Atrial fibrillation: Paroxysmal.  NSR today by exam.  Continue apixaban (reduced dose given age and creatinine).  Check CBC with apixaban use. 3. Hyperlipidemia: Continue statin.  4. HTN: BP elevated today but normal at home.  - She has been able to tolerate nebivolol.   - Continue hydralazine, clonidine, and losartan at current doses. 5. Chronic diastolic CHF: Volume status looks ok.   - Continue bumetanide 2 mg bid.   6. CKD: Stage III. Sees Dr Joelyn Oms. BMET today.   Followup in 4 months  Loralie Champagne 12/22/2016

## 2017-01-06 ENCOUNTER — Other Ambulatory Visit (HOSPITAL_COMMUNITY): Payer: Self-pay | Admitting: Cardiology

## 2017-01-06 MED ORDER — ISOSORBIDE MONONITRATE ER 30 MG PO TB24: 30.0000 mg | ORAL_TABLET | Freq: Every day | ORAL | 3 refills | Status: AC

## 2017-01-07 ENCOUNTER — Other Ambulatory Visit (HOSPITAL_COMMUNITY): Payer: Self-pay | Admitting: *Deleted

## 2017-01-07 ENCOUNTER — Other Ambulatory Visit (HOSPITAL_COMMUNITY): Payer: Self-pay
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
## 2017-01-08 ENCOUNTER — Other Ambulatory Visit (HOSPITAL_COMMUNITY): Payer: Self-pay | Admitting: Cardiology

## 2017-01-08 MED ORDER — BUMETANIDE 1 MG PO TABS: 2.0000 mg | ORAL_TABLET | Freq: Two times a day (BID) | ORAL | 3 refills | Status: DC

## 2017-01-12 ENCOUNTER — Other Ambulatory Visit (HOSPITAL_COMMUNITY): Payer: Self-pay | Admitting: Cardiology

## 2017-01-12 MED ORDER — SPIRONOLACTONE 25 MG PO TABS: 12.5000 mg | ORAL_TABLET | Freq: Every day | ORAL | 3 refills | Status: AC

## 2017-01-20 ENCOUNTER — Other Ambulatory Visit (HOSPITAL_COMMUNITY): Payer: Self-pay

## 2017-01-20 NOTE — Progress Notes (Signed)
Paramedicine Encounter    Patient ID: Shelia Woods, female    DOB: 1931/01/24, 82 y.o.   MRN: [id number]   Patient Care Team: Carollee Herter, Alferd Apa, DO as PCP - General (Family Medicine) Larey Dresser, MD as Consulting Physician (Cardiology)  Patient Active Problem List   Diagnosis Date Noted  . Chronic diastolic CHF (congestive heart failure) ([place]) 10/20/2015  . Chest pressure 01/23/2015  . PAF (paroxysmal atrial fibrillation) ([place]) 01/23/2015  . Abdominal pain 11/08/2013  . CKD (chronic kidney disease), stage III 11/08/2013  . Colitis 11/08/2013  . General medical examination 01/13/2011  . MUSCLE SPASM, TRAPEZIUS 04/02/2010  . WOUND, OPEN, ELBOW 04/02/2010  . CONTUSION, HEAD 04/02/2010  . CONTUSION OF CHEST WALL 04/02/2010  . CONTUSION, ELBOW 04/02/2010  . CANDIDIASIS, SKIN 01/02/2010  . MACULAR DEGENERATION, SENILE, NONEXUDATIVE 01/01/2010  . CATARACTS, SENILE, NUCLEAR, BILATERAL 01/01/2010  . UTI 11/11/2009  . EDEMA- LOCALIZED 09/11/2009  . DIZZINESS 08/08/2009  . SMALL BOWEL OBSTRUCTION 11/05/2008  . PAROXYSMAL ATRIAL FIBRILLATION 09/29/2008  . RHINITIS 09/06/2008  . PROTEINURIA 05/17/2008  . Chronic kidney disease 04/10/2008  . INGROWN TOENAIL, INFECTED 04/10/2008  . DERMATOPHYTOSIS OF THE BODY 11/30/2007  . HYPERKALEMIA 11/30/2007  . RENAL FAILURE 11/22/2007  . FATIGUE 11/22/2007  . UNSPECIFIED CONSTIPATION 03/01/2007  . NEOP, MALIGNANT, SKIN NOS 11/08/2006  . SYNDROME, CARPAL TUNNEL 11/08/2006  . PERIPHERAL NEUROPATHY, LOWER EXTREMITY 11/08/2006  . Type 2 diabetes, controlled, with neuropathy ([place]) 06/24/2006  . HYPERCHOLESTEROLEMIA 06/24/2006  . HYPERTENSION, BENIGN SYSTEMIC 06/24/2006  . Osteoarthrosis involving lower leg 06/24/2006  . BACK PAIN, LOW 06/24/2006    Current Outpatient Prescriptions:  .  acetaminophen (TYLENOL) 500 MG tablet, Take 1,000 mg by mouth 3 (three) times daily. , Disp: , Rfl:  .  apixaban (ELIQUIS) 2.5 MG TABS tablet, Take 1  tablet (2.5 mg total) by mouth 2 (two) times daily., Disp: 60 tablet, Rfl: 5 .  atorvastatin (LIPITOR) 40 MG tablet, TAKE 1 TABLET(40 MG) BY MOUTH AT BEDTIME, Disp: 90 tablet, Rfl: 1 .  Blood Glucose Monitoring Suppl (ONE TOUCH ULTRA SYSTEM KIT) W/DEVICE KIT, 1 kit by Does not apply route once. E11.9, Disp: 1 each, Rfl: 0 .  bumetanide (BUMEX) 1 MG tablet, Take 2 tablets (2 mg total) by mouth 2 (two) times daily., Disp: 120 tablet, Rfl: 3 .  cloNIDine (CATAPRES) 0.3 MG tablet, Take 1.5 tablets (0.45 mg total) by mouth 3 (three) times daily., Disp: 135 tablet, Rfl: 5 .  fenofibrate (TRICOR) 48 MG tablet, Take 1 tablet (48 mg total) by mouth daily., Disp: 90 tablet, Rfl: 3 .  fluocinonide cream (LIDEX) 0.05 %, APPLY EXTERNALLY TO THE AFFECTED AREA TWICE DAILY (Patient not taking: Reported on 01/07/2017), Disp: 120 g, Rfl: 0 .  Glucosamine-Chondroitin (OSTEO BI-FLEX REGULAR STRENGTH) 250-200 MG TABS, Take 1 tablet by mouth 2 (two) times daily. , Disp: , Rfl:  .  hydrALAZINE (APRESOLINE) 100 MG tablet, Take 1 tablet (100 mg total) by mouth 3 (three) times daily., Disp: 90 tablet, Rfl: 6 .  insulin NPH Human (HUMULIN N,NOVOLIN N) 100 UNIT/ML injection, Inject 0.3 mLs (30 Units total) into the skin at bedtime., Disp: 3 vial, Rfl: 3 .  insulin regular (NOVOLIN R,HUMULIN R) 100 units/mL injection, 15 units in the morning, 15 units midday and 20 units in the evening (Patient taking differently: 15 units in the morning, 15 units midday and 15 units in the evening), Disp: 10 mL, Rfl: 2 .  isosorbide mononitrate (IMDUR) 30 MG 24 hr  tablet, Take 1 tablet (30 mg total) by mouth daily., Disp: 90 tablet, Rfl: 3 .  loratadine (CLARITIN) 10 MG tablet, Take 10 mg by mouth daily as needed for allergies. , Disp: , Rfl:  .  losartan (COZAAR) 100 MG tablet, TAKE 1 TABLET(100 MG) BY MOUTH DAILY, Disp: 30 tablet, Rfl: 11 .  Melatonin 1 MG TABS, Take 1 mg by mouth at bedtime., Disp: , Rfl:  .  metolazone (ZAROXOLYN) 2.5 MG  tablet, Take 1 tablet (2.5 mg total) by mouth as directed. Call CHF clinic before taking. (Patient not taking: Reported on 01/07/2017), Disp: 5 tablet, Rfl: 0 .  Multiple Vitamins-Minerals (PRESERVISION AREDS 2 PO), Take 1 capsule by mouth 2 (two) times daily. , Disp: , Rfl:  .  nebivolol (BYSTOLIC) 5 MG tablet, Take 0.5 tablets (2.5 mg total) by mouth daily., Disp: 30 tablet, Rfl: 3 .  NIFEdipine (PROCARDIA XL/ADALAT-CC) 60 MG 24 hr tablet, Take 2 tablets (120 mg total) by mouth every morning., Disp: 180 tablet, Rfl: 1 .  ONE TOUCH ULTRA TEST test strip, TEST TWICE DAILY AS DIRECTED, Disp: 100 each, Rfl: 0 .  ONETOUCH DELICA LANCETS 58X MISC, Use one lancet each time sugars are checked. Pt tests twice daily., Disp: 100 each, Rfl: 12 .  ONETOUCH DELICA LANCETS FINE MISC, USE TWICE DAILY, Disp: 100 each, Rfl: 11 .  polyethylene glycol (MIRALAX / GLYCOLAX) packet, Take 17 g by mouth 2 (two) times daily as needed for mild constipation., Disp: , Rfl:  .  spironolactone (ALDACTONE) 25 MG tablet, Take 0.5 tablets (12.5 mg total) by mouth daily., Disp: 45 tablet, Rfl: 3 .  traMADol (ULTRAM) 50 MG tablet, Take 1 tablet (50 mg total) by mouth every 6 (six) hours as needed., Disp: 60 tablet, Rfl: 0 Allergies  Allergen Reactions  . Aranesp (Albumin Free) [Darbepoetin Alfa] Other (See Comments)    Headaches, lower extremity edema, nausea   . Beta Adrenergic Blockers     REACTION: depression  . Cephalexin Other (See Comments)    unknown  . Codone [Hydrocodone] Nausea And Vomiting  . Darvon [Propoxyphene]     dizziness  . Penicillins     REACTION: hives/intestinal tract  . Warfarin Sodium     REACTION: bleeding on skin surface  . Latex Rash     Social History   Social History  . Marital status: Married    Spouse name: N/A  . Number of children: 4  . Years of education: N/A   Occupational History  .  Retired    retired/disabled (1994)   Social History Main Topics  . Smoking status: Former  Smoker    Quit date: 04/27/1978  . Smokeless tobacco: Never Used  . Alcohol use No  . Drug use: No  . Sexual activity: Not on file   Other Topics Concern  . Not on file   Social History Narrative   Four children   Regular exercise-no    Physical Exam  Constitutional: She is oriented to person, place, and time.  Cardiovascular: Normal rate and regular rhythm.   Pulmonary/Chest: Effort normal and breath sounds normal. No respiratory distress. She has no wheezes. She has no rales.  Abdominal: Soft.  Musculoskeletal: Normal range of motion. She exhibits no edema.  Neurological: She is alert and oriented to person, place, and time.  Skin: Skin is warm and dry.  Psychiatric: She has a normal mood and affect.        Future Appointments Date Time Provider [place]  04/14/2017 3:00 PM Larey Dresser, MD [facility] None   There were no vitals taken for this visit. Weight yesterday- 207 lb Last visit weight-   Shelia Woods was seen at home today and reported feeling well. She denied SOB, headache and dizziness. She advised she has not had any changes in her medications and her edema has decreased significantly since the first time I saw her. We discussed discharge and she felt very comfortable with the idea. She is still wanting a shower chair to prevent back pain with showering. I will pass this information along to Aubrey at the clinic.   Time spent with patient: 60 minutes  Jacquiline Doe, EMT 01/20/17  ACTION: Home visit completed

## 2017-01-22 ENCOUNTER — Telehealth (HOSPITAL_COMMUNITY): Payer: Self-pay | Admitting: Surgery

## 2017-01-22 NOTE — Telephone Encounter (Signed)
Shelia Woods will be discharged from the [facility]. She has met criteria for success independently. She has been encouraged to contact the [facility] with any concerns or questions related to her HF.

## 2017-02-08 ENCOUNTER — Other Ambulatory Visit (HOSPITAL_COMMUNITY): Payer: Self-pay | Admitting: *Deleted

## 2017-02-08 MED ORDER — NEBIVOLOL HCL 5 MG PO TABS: 2.5000 mg | ORAL_TABLET | Freq: Every day | ORAL | 3 refills | Status: DC

## 2017-03-16 ENCOUNTER — Telehealth (HOSPITAL_COMMUNITY): Payer: Self-pay | Admitting: *Deleted

## 2017-03-16 NOTE — Telephone Encounter (Signed)
I called patient to let her know that her insurance will not cover the shower swivel chair but she can reach out to [employer].  I gave her the number [phone] to call and advised her to call us back if they need any information from [place] to get her shower chair.

## 2017-03-16 NOTE — Telephone Encounter (Signed)
-----   Message from [place] sent at 03/10/2017  2:26 PM EST ----- Regarding: RE: Shower chair This [employer] will not cover a shower chair.  [facility] doesn't carry a swivel shower chair.  I was told that the pt could reach out to Renee at [facility] and she could order one for them.  I'm not sure what the price would be.  I hope this helps.  Thank you! ----- Message ----- From: Darron Doom, RN Sent: 03/10/2017   1:32 PM To: Jiles Crocker, Darlina Guys Subject: Shower chair                                   Dr. Aundra Dubin has ordered for patient to have a swivel shower chair. What do I need to do to get this ordered for patient?

## 2017-04-01 ENCOUNTER — Other Ambulatory Visit (HOSPITAL_COMMUNITY): Payer: Self-pay | Admitting: *Deleted

## 2017-04-01 DIAGNOSIS — I5032 Chronic diastolic (congestive) heart failure: Secondary | ICD-10-CM

## 2017-04-01 MED ORDER — CLONIDINE HCL 0.3 MG PO TABS: ORAL_TABLET | ORAL | 5 refills | Status: AC

## 2017-04-14 ENCOUNTER — Ambulatory Visit (HOSPITAL_COMMUNITY)
Admission: RE | Admit: 2017-04-14 | Discharge: 2017-04-14 | Disposition: A | Discharge status: Home / Self Care | Payer: Medicare Other | Source: Ambulatory Visit | Attending: Cardiology | Admitting: Cardiology

## 2017-04-14 VITALS — BP 162/60 | HR 62 | Wt 208.0 lb

## 2017-04-14 DIAGNOSIS — E1122 Type 2 diabetes mellitus with diabetic chronic kidney disease: Secondary | ICD-10-CM | POA: Insufficient documentation

## 2017-04-14 DIAGNOSIS — I5032 Chronic diastolic (congestive) heart failure: Secondary | ICD-10-CM | POA: Insufficient documentation

## 2017-04-14 DIAGNOSIS — I130 Hypertensive heart and chronic kidney disease with heart failure and stage 1 through stage 4 chronic kidney disease, or unspecified chronic kidney disease: Secondary | ICD-10-CM | POA: Insufficient documentation

## 2017-04-14 DIAGNOSIS — E785 Hyperlipidemia, unspecified: Secondary | ICD-10-CM | POA: Diagnosis not present

## 2017-04-14 DIAGNOSIS — D631 Anemia in chronic kidney disease: Secondary | ICD-10-CM | POA: Diagnosis not present

## 2017-04-14 DIAGNOSIS — Z794 Long term (current) use of insulin: Secondary | ICD-10-CM | POA: Diagnosis not present

## 2017-04-14 DIAGNOSIS — Z7901 Long term (current) use of anticoagulants: Secondary | ICD-10-CM | POA: Insufficient documentation

## 2017-04-14 DIAGNOSIS — I480 Paroxysmal atrial fibrillation: Secondary | ICD-10-CM | POA: Diagnosis not present

## 2017-04-14 DIAGNOSIS — I10 Essential (primary) hypertension: Secondary | ICD-10-CM | POA: Diagnosis not present

## 2017-04-14 DIAGNOSIS — Z79899 Other long term (current) drug therapy: Secondary | ICD-10-CM | POA: Diagnosis not present

## 2017-04-14 DIAGNOSIS — E669 Obesity, unspecified: Secondary | ICD-10-CM | POA: Insufficient documentation

## 2017-04-14 DIAGNOSIS — N184 Chronic kidney disease, stage 4 (severe): Secondary | ICD-10-CM | POA: Diagnosis not present

## 2017-04-14 DIAGNOSIS — Z87891 Personal history of nicotine dependence: Secondary | ICD-10-CM | POA: Insufficient documentation

## 2017-04-14 LAB — CBC
HEMATOCRIT: 28.4 % — AB (ref 36.0–46.0)
Hemoglobin: 8.9 g/dL — ABNORMAL LOW (ref 12.0–15.0)
MCH: 28.9 pg (ref 26.0–34.0)
MCHC: 31.3 g/dL (ref 30.0–36.0)
MCV: 92.2 fL (ref 78.0–100.0)
Platelets: 344 10*3/uL (ref 150–400)
RBC: 3.08 MIL/uL — ABNORMAL LOW (ref 3.87–5.11)
RDW: 14.5 % (ref 11.5–15.5)
WBC: 6.2 10*3/uL (ref 4.0–10.5)

## 2017-04-14 LAB — BASIC METABOLIC PANEL
ANION GAP: 9 (ref 5–15)
BUN: 59 mg/dL — AB (ref 6–20)
CALCIUM: 9.4 mg/dL (ref 8.9–10.3)
CO2: 22 mmol/L (ref 22–32)
Chloride: 104 mmol/L (ref 101–111)
Creatinine, Ser: 2.58 mg/dL — ABNORMAL HIGH (ref 0.44–1.00)
GFR calc Af Amer: 19 mL/min — ABNORMAL LOW (ref >60)
GFR, EST NON AFRICAN AMERICAN: 16 mL/min — AB (ref >60)
GLUCOSE: 77 mg/dL (ref 65–99)
POTASSIUM: 4.3 mmol/L (ref 3.5–5.1)
SODIUM: 135 mmol/L (ref 135–145)

## 2017-04-14 MED ORDER — NEBIVOLOL HCL 5 MG PO TABS: 5.0000 mg | ORAL_TABLET | Freq: Every day | ORAL | 3 refills | Status: DC

## 2017-04-14 NOTE — Progress Notes (Signed)
ADVANCED HF CLINIC NOTE  Patient ID: Shelia Woods, female   DOB: 1930/12/18, 83 y.o.   MRN: [id number] PCP: Bebe Liter Cardiology: Dr. Aundra Dubin  Shelia Woods is an 83 y/o woman with h/o obesity, HTN, CKD stage 3-4, PAF, diabetes.    She had a nuclear stress test in 2007 that showed EF 69% and no ischemia. She has never has a cath. Renal dopplers in 2009 showed no RAS.   She does not ambulate much due to her severe back pain and uses a walker or wheelchair.   Has a h/o PAF. Event monitor in 10/16 showed 1 run of atrial fibrillation.  She is on apixaban 2.5 mg bid.   She returns today for followup of CHF.  She remains weak.  Uses a wheelchair when out of the house, walks in the house with a walker.  No dyspnea if she walks slowly with the walker. She has chronic low back and hip pain that are very limiting.  No BRBPR/melena.  BP is high today, SBP runs 130s-150s at home.     Labs (10/16): LDL 89, K 4.1, creatinine 1.71, hgb 11.3 Labs (2/17): K 3.7, creatinine 1.58, LDL 93, TGs 285 Labs (8/17): K 4.4, creatinine 2.33 Labs (9/17): K 3.7, creatinine 2.04 Labs (10/17): K 3.6, creatinine 2.01, hgb 10.3 Labs (3/18): K 3.5, creatinine 1.68 Labs (6/18): K 3.9, creatinine 2.15, BNP 241 Labs (8/18): K 3.9, creatinine 2.4, hgb 9.8  PMH: 1. Obesity 2. HTN: Renal artery dopplers (2009) with no renal artery stenosis. 3. CKD III-IV 4. Type II diabetes 5. Atrial fibrillation: Paroxysmal.  Event monitor (10/16) showed average HR 53 with 1 brief run of atrial fibrillation (controlled rate).  - Holter (11/17): rare PACs and PVCs.   6. Cardiolite (2007) with EF 69%, no ischemia.  7. Hyperlipidemia 8. Chronic diastolic CHF: Echo (76/73) with EF 60-65%, mild LVH.  Echo (11/16) with EF 60-65%, moderate LVH.  9. Low back pain: Severe and debilitating.  10. Anemia of chronic disease/renal disease  ROS: All systems negative except as listed in HPI, PMH and Problem List.  Social History   Socioeconomic  History  . Marital status: Married    Spouse name: Not on file  . Number of children: 4  . Years of education: Not on file  . Highest education level: Not on file  Social Needs  . Financial resource strain: Not on file  . Food insecurity - worry: Not on file  . Food insecurity - inability: Not on file  . Transportation needs - medical: Not on file  . Transportation needs - non-medical: Not on file  Occupational History    Employer: RETIRED    Comment: retired/disabled (1994)  Tobacco Use  . Smoking status: Former Smoker    Last attempt to quit: 04/27/1978    Years since quitting: 38.9  . Smokeless tobacco: Never Used  Substance and Sexual Activity  . Alcohol use: No  . Drug use: No  . Sexual activity: Not on file  Other Topics Concern  . Not on file  Social History Narrative   Four children   Regular exercise-no    Family History  Problem Relation Age of Onset  . Diabetes Sister   . Parkinsonism Sister     Current Outpatient Medications  Medication Sig Dispense Refill  . acetaminophen (TYLENOL) 500 MG tablet Take 1,000 mg by mouth 3 (three) times daily.     Marland Kitchen apixaban (ELIQUIS) 2.5 MG TABS tablet Take 1 tablet (2.5  mg total) by mouth 2 (two) times daily. 60 tablet 5  . atorvastatin (LIPITOR) 40 MG tablet TAKE 1 TABLET(40 MG) BY MOUTH AT BEDTIME 90 tablet 1  . Blood Glucose Monitoring Suppl (ONE TOUCH ULTRA SYSTEM KIT) W/DEVICE KIT 1 kit by Does not apply route once. E11.9 1 each 0  . bumetanide (BUMEX) 1 MG tablet Take 2 tablets (2 mg total) by mouth 2 (two) times daily. 120 tablet 3  . cloNIDine (CATAPRES) 0.3 MG tablet Take 1.5 tablets (0.45 mg total) by mouth 3 (three) times daily. 135 tablet 5  . fenofibrate (TRICOR) 48 MG tablet Take 1 tablet (48 mg total) by mouth daily. 90 tablet 3  . fluocinonide cream (LIDEX) 0.05 % APPLY EXTERNALLY TO THE AFFECTED AREA TWICE DAILY 120 g 0  . Glucosamine-Chondroitin (OSTEO BI-FLEX REGULAR STRENGTH) 250-200 MG TABS Take 1 tablet  by mouth 2 (two) times daily.     . hydrALAZINE (APRESOLINE) 100 MG tablet Take 1 tablet (100 mg total) by mouth 3 (three) times daily. 90 tablet 6  . insulin NPH Human (HUMULIN N,NOVOLIN N) 100 UNIT/ML injection Inject 0.3 mLs (30 Units total) into the skin at bedtime. 3 vial 3  . insulin regular (NOVOLIN R,HUMULIN R) 100 units/mL injection 15 units in the morning, 15 units midday and 20 units in the evening (Patient taking differently: 15 units in the morning, 15 units midday and 15 units in the evening) 10 mL 2  . isosorbide mononitrate (IMDUR) 30 MG 24 hr tablet Take 1 tablet (30 mg total) by mouth daily. 90 tablet 3  . loratadine (CLARITIN) 10 MG tablet Take 10 mg by mouth daily as needed for allergies.     Marland Kitchen losartan (COZAAR) 100 MG tablet TAKE 1 TABLET(100 MG) BY MOUTH DAILY 30 tablet 11  . Melatonin 1 MG TABS Take 1 mg by mouth at bedtime.    . Multiple Vitamins-Minerals (PRESERVISION AREDS 2 PO) Take 1 capsule by mouth 2 (two) times daily.     . nebivolol (BYSTOLIC) 5 MG tablet Take 1 tablet (5 mg total) by mouth daily. 30 tablet 3  . NIFEdipine (PROCARDIA XL/ADALAT-CC) 60 MG 24 hr tablet Take 2 tablets (120 mg total) by mouth every morning. 180 tablet 1  . ONE TOUCH ULTRA TEST test strip TEST TWICE DAILY AS DIRECTED 100 each 0  . ONETOUCH DELICA LANCETS 20F MISC Use one lancet each time sugars are checked. Pt tests twice daily. 100 each 12  . ONETOUCH DELICA LANCETS FINE MISC USE TWICE DAILY 100 each 11  . polyethylene glycol (MIRALAX / GLYCOLAX) packet Take 17 g by mouth 2 (two) times daily as needed for mild constipation.    Marland Kitchen spironolactone (ALDACTONE) 25 MG tablet Take 0.5 tablets (12.5 mg total) by mouth daily. 45 tablet 3  . traMADol (ULTRAM) 50 MG tablet Take 1 tablet (50 mg total) by mouth every 6 (six) hours as needed. 60 tablet 0  . metolazone (ZAROXOLYN) 2.5 MG tablet Take 1 tablet (2.5 mg total) by mouth as directed. Call CHF clinic before taking. (Patient not taking: Reported  on 01/07/2017) 5 tablet 0   No current facility-administered medications for this encounter.     Vitals:   04/14/17 1508  BP: (!) 162/60  Pulse: 62  SpO2: 96%  Weight: 208 lb (94.3 kg)    PHYSICAL EXAM: General: NAD, in wheelchair Neck: No JVD, no thyromegaly or thyroid nodule.  Lungs: Clear to auscultation bilaterally with normal respiratory effort. CV: Nondisplaced PMI.  Heart regular S1/S2, no S3/S4, no murmur.  Trace ankle edema.  No carotid bruit.  Normal pedal pulses.  Abdomen: Soft, nontender, no hepatosplenomegaly, no distention.  Skin: Intact without lesions or rashes.  Neurologic: Alert and oriented x 3.  Psych: Normal affect. Extremities: No clubbing or cyanosis.  HEENT: Normal.  .  ASSESSMENT & PLAN: 1. Chest pain: No recent chest pain. She has multiple RFs for CAD, so it is certainly possible that her prior chest tightness was due to angina.  We had wanted her to have a Cardiolite in 2016 but she cancelled the test.  With no recent chest pain, I think we can hold off on stress test.  Would need very high threshold for coronary angiography given CKD.  - Continue Imdur 30 daily, no aspirin due to apixaban use, continue statin.  2. Atrial fibrillation: Paroxysmal.  NSR today by exam.  Continue apixaban (reduced dose given age and creatinine).  Check CBC today with apixaban use. 3. Hyperlipidemia: Continue statin.  4. HTN: BP elevated today. - Increase nebivolol to 5 mg daily.  - Continue hydralazine, clonidine, and losartan at current doses. 5. Chronic diastolic CHF: Volume status looks ok.   - Continue bumetanide 2 mg bid.  BMET today.  6. CKD: Stage III. Sees Dr Joelyn Woods. BMET today.   Followup in 3 months  Loralie Champagne 04/14/2017

## 2017-04-14 NOTE — Patient Instructions (Signed)
Increase Bystolic 5 mg (1 tab) daily  Labs drawn today (if we do not call you, then your lab work was stable)   Your physician recommends that you schedule a follow-up appointment in: 3 months with Dr. Aundra Dubin

## 2017-04-19 ENCOUNTER — Other Ambulatory Visit (HOSPITAL_COMMUNITY): Payer: Self-pay | Admitting: *Deleted

## 2017-04-19 MED ORDER — APIXABAN 2.5 MG PO TABS: 2.5000 mg | ORAL_TABLET | Freq: Two times a day (BID) | ORAL | 5 refills | Status: AC

## 2017-04-23 ENCOUNTER — Other Ambulatory Visit (HOSPITAL_COMMUNITY): Payer: Self-pay

## 2017-04-23 MED ORDER — HYDRALAZINE HCL 100 MG PO TABS: 100.0000 mg | ORAL_TABLET | Freq: Three times a day (TID) | ORAL | 6 refills | Status: AC

## 2017-04-28 ENCOUNTER — Other Ambulatory Visit (HOSPITAL_COMMUNITY): Payer: Self-pay | Admitting: Internal Medicine

## 2017-05-03 ENCOUNTER — Other Ambulatory Visit (HOSPITAL_COMMUNITY): Payer: Self-pay

## 2017-05-03 ENCOUNTER — Telehealth (HOSPITAL_COMMUNITY): Payer: Self-pay

## 2017-05-03 DIAGNOSIS — I10 Essential (primary) hypertension: Secondary | ICD-10-CM

## 2017-05-03 MED ORDER — NEBIVOLOL HCL 2.5 MG PO TABS: 2.5000 mg | ORAL_TABLET | Freq: Every day | ORAL | 3 refills | Status: AC

## 2017-05-03 MED ORDER — LOSARTAN POTASSIUM 100 MG PO TABS: 100.0000 mg | ORAL_TABLET | Freq: Every day | ORAL | 11 refills | Status: AC

## 2017-05-03 NOTE — Telephone Encounter (Signed)
Advanced Heart Failure Triage Encounter  Patient Name: Shelia Woods  Date of Call: 05/03/17  Problem:  Pt called with to complaints  1. Pt at last visit in 12/18 was instructed to increase Bystolic from 2.5 to 5 mg daily. Pt since has been feeling tired, depressed, and has no appetite. She recalls this has happened before.   2. Pt has c/o 8 lbs weight gain over a week or so (does not weigh daily), some swelling, and SOB. Was given Metolazone in 6/18 only has taken 1 in June. Denies CP and has chronic cough.   Plan:    Shirley Muscat, RN

## 2017-05-03 NOTE — Telephone Encounter (Signed)
Decrease bystolic back to 2.5 mg daily.    Ok to take metolazone x 1.     Legrand Como [address] [place], PA-C 05/03/2017 1:41 PM

## 2017-05-03 NOTE — Telephone Encounter (Signed)
Pt aware of results and agreeable to med changes (changes made in [facility])

## 2017-05-07 ENCOUNTER — Encounter: Payer: Self-pay | Admitting: Family Medicine

## 2017-05-07 ENCOUNTER — Ambulatory Visit: Payer: Medicare Other | Admitting: Family Medicine

## 2017-05-07 VITALS — BP 142/68 | HR 81 | Ht 66.0 in | Wt 210.0 lb

## 2017-05-07 DIAGNOSIS — I5032 Chronic diastolic (congestive) heart failure: Secondary | ICD-10-CM

## 2017-05-07 DIAGNOSIS — L8940 Pressure ulcer of contiguous site of back, buttock and hip, unspecified stage: Secondary | ICD-10-CM | POA: Insufficient documentation

## 2017-05-07 DIAGNOSIS — E1169 Type 2 diabetes mellitus with other specified complication: Secondary | ICD-10-CM

## 2017-05-07 DIAGNOSIS — N184 Chronic kidney disease, stage 4 (severe): Secondary | ICD-10-CM | POA: Diagnosis not present

## 2017-05-07 DIAGNOSIS — M545 Low back pain: Secondary | ICD-10-CM | POA: Diagnosis not present

## 2017-05-07 DIAGNOSIS — G8929 Other chronic pain: Secondary | ICD-10-CM | POA: Diagnosis not present

## 2017-05-07 DIAGNOSIS — Z794 Long term (current) use of insulin: Secondary | ICD-10-CM

## 2017-05-07 MED ORDER — TRAMADOL HCL 50 MG PO TABS
50.0000 mg | ORAL_TABLET | Freq: Four times a day (QID) | ORAL | 2 refills | Status: DC | PRN
Start: 2017-05-07 — End: 2017-05-17

## 2017-05-07 NOTE — Progress Notes (Addendum)
Subjective:  Patient ID: Shelia Woods, female    DOB: 01-Jul-1930  Age: 84 y.o. MRN: [id number]  CC: Establish Care   HPI ANZAL BARTNICK presents for establishment of care.  This patient has serious medical comorbidities.  She has chronic diastolic heart failure, stage IV kidney disease and insulin-dependent type 2 diabetes with multiple complications.  She is seen both by cardiology and nephrology.  She is wheelchair-bound secondary to chronic back pain.  As a result of her immobility she has developed pressure sores on her buttocks.  Visiting home health is helping with management of her pressure sores.  These also called because her a fair amount of pain.  She is only taking Ultram at night and says that her pain is not controlled.  She is seeing cardiology for management of her hypertension heart failure and elevated cholesterol.  With regards to her diabetes, she is taking 30 units of NPH at night.  Currently she doses herself with 5 units of regular 3 times a day before each meal.  She is not counting carbs. History Chelli has a past medical history of CATARACTS, SENILE, NUCLEAR, BILATERAL (01/01/2010), Dermatophytosis of the body (11/30/2007), DIABETES MELLITUS II, UNCOMPLICATED (7/35/3299), Dysrhythmia, FATIGUE (11/22/2007), HYPERCHOLESTEROLEMIA (06/24/2006), Hyperpotassemia, HYPERTENSION, BENIGN SYSTEMIC (06/24/2006), INGROWN TOENAIL, INFECTED (04/10/2008), MACULAR DEGENERATION, SENILE, NONEXUDATIVE (01/01/2010), MUSCLE SPASM, TRAPEZIUS (04/02/2010), NEOP, MALIGNANT, SKIN NOS (11/08/2006), OSTEOARTHRITIS, LOWER LEG (06/24/2006), PAROXYSMAL ATRIAL FIBRILLATION (09/29/2008), PERIPHERAL NEUROPATHY, LOWER EXTREMITY (11/08/2006), Proteinuria (05/17/2008), RENAL FAILURE (11/22/2007), RENAL INSUFFICIENCY, CHRONIC (04/10/2008), SMALL BOWEL OBSTRUCTION (11/05/2008), and SYNDROME, CARPAL TUNNEL (11/08/2006).   She has a past surgical history that includes Carpal tunnel release; Appendectomy; Abdominal hysterectomy;  Oophorectomy; Tonsillectomy; and Spinal fusion.   Her family history includes Diabetes in her sister; Parkinsonism in her sister.She reports that she quit smoking about 39 years ago. she has never used smokeless tobacco. She reports that she does not drink alcohol or use drugs.  Outpatient Medications Prior to Visit  Medication Sig Dispense Refill  . acetaminophen (TYLENOL) 500 MG tablet Take 1,000 mg by mouth 3 (three) times daily.     Marland Kitchen apixaban (ELIQUIS) 2.5 MG TABS tablet Take 1 tablet (2.5 mg total) by mouth 2 (two) times daily. 60 tablet 5  . atorvastatin (LIPITOR) 40 MG tablet TAKE 1 TABLET(40 MG) BY MOUTH AT BEDTIME 90 tablet 1  . Blood Glucose Monitoring Suppl (ONE TOUCH ULTRA SYSTEM KIT) W/DEVICE KIT 1 kit by Does not apply route once. E11.9 1 each 0  . bumetanide (BUMEX) 1 MG tablet TAKE 2 TABLETS(2 MG) BY MOUTH TWICE DAILY 120 tablet 0  . cloNIDine (CATAPRES) 0.3 MG tablet Take 1.5 tablets (0.45 mg total) by mouth 3 (three) times daily. 135 tablet 5  . fenofibrate (TRICOR) 48 MG tablet Take 1 tablet (48 mg total) by mouth daily. 90 tablet 3  . fluocinonide cream (LIDEX) 0.05 % APPLY EXTERNALLY TO THE AFFECTED AREA TWICE DAILY 120 g 0  . Glucosamine-Chondroitin (OSTEO BI-FLEX REGULAR STRENGTH) 250-200 MG TABS Take 1 tablet by mouth 2 (two) times daily.     . hydrALAZINE (APRESOLINE) 100 MG tablet Take 1 tablet (100 mg total) by mouth 3 (three) times daily. 90 tablet 6  . insulin NPH Human (HUMULIN N,NOVOLIN N) 100 UNIT/ML injection Inject 0.3 mLs (30 Units total) into the skin at bedtime. 3 vial 3  . insulin regular (NOVOLIN R,HUMULIN R) 100 units/mL injection 15 units in the morning, 15 units midday and 20 units in the evening (Patient taking differently: 15  units in the morning, 15 units midday and 15 units in the evening) 10 mL 2  . isosorbide mononitrate (IMDUR) 30 MG 24 hr tablet Take 1 tablet (30 mg total) by mouth daily. 90 tablet 3  . loratadine (CLARITIN) 10 MG tablet Take 10  mg by mouth daily as needed for allergies.     Marland Kitchen losartan (COZAAR) 100 MG tablet Take 1 tablet (100 mg total) by mouth daily. 30 tablet 11  . Melatonin 1 MG TABS Take 1 mg by mouth at bedtime.    . Multiple Vitamins-Minerals (PRESERVISION AREDS 2 PO) Take 1 capsule by mouth 2 (two) times daily.     . nebivolol (BYSTOLIC) 2.5 MG tablet Take 1 tablet (2.5 mg total) by mouth daily. 30 tablet 3  . NIFEdipine (PROCARDIA XL/ADALAT-CC) 60 MG 24 hr tablet Take 2 tablets (120 mg total) by mouth every morning. 180 tablet 1  . ONE TOUCH ULTRA TEST test strip TEST TWICE DAILY AS DIRECTED 100 each 0  . ONETOUCH DELICA LANCETS 52D MISC Use one lancet each time sugars are checked. Pt tests twice daily. 100 each 12  . ONETOUCH DELICA LANCETS FINE MISC USE TWICE DAILY 100 each 11  . polyethylene glycol (MIRALAX / GLYCOLAX) packet Take 17 g by mouth 2 (two) times daily as needed for mild constipation.    . traMADol (ULTRAM) 50 MG tablet Take 1 tablet (50 mg total) by mouth every 6 (six) hours as needed. 60 tablet 0  . metolazone (ZAROXOLYN) 2.5 MG tablet Take 1 tablet (2.5 mg total) by mouth as directed. Call CHF clinic before taking. (Patient not taking: Reported on 01/07/2017) 5 tablet 0  . spironolactone (ALDACTONE) 25 MG tablet Take 0.5 tablets (12.5 mg total) by mouth daily. 45 tablet 3   No facility-administered medications prior to visit.     ROS Review of Systems  Constitutional: Negative for chills and fever.  HENT: Negative.   Eyes: Negative.   Respiratory: Negative.  Negative for chest tightness.   Cardiovascular: Positive for leg swelling. Negative for chest pain.  Gastrointestinal: Negative.   Musculoskeletal: Positive for arthralgias, back pain and gait problem.  Skin: Positive for wound.    Objective:  BP (!) 142/68 (BP Location: Left Arm, Patient Position: Sitting, Cuff Size: Normal)   Pulse 81   Ht '5\' 6"'  (1.676 m)   Wt 210 lb (95.3 kg)   SpO2 98%   BMI 33.89 kg/m   Physical Exam    Constitutional: She is oriented to person, place, and time. She appears well-developed and well-nourished. No distress.  HENT:  Head: Normocephalic and atraumatic.  Right Ear: External ear normal.  Left Ear: External ear normal.  Mouth/Throat: Oropharynx is clear and moist. No oropharyngeal exudate.  Eyes: Pupils are equal, round, and reactive to light. Right eye exhibits no discharge. Left eye exhibits no discharge. No scleral icterus.  Neck: Neck supple. No JVD present. No tracheal deviation present. No thyromegaly present.  Cardiovascular: Normal rate, regular rhythm and normal heart sounds.  Pulmonary/Chest: Effort normal and breath sounds normal. No stridor.  Musculoskeletal:       Right lower leg: She exhibits edema.       Left lower leg: She exhibits edema.  Lymphadenopathy:    She has no cervical adenopathy.  Neurological: She is alert and oriented to person, place, and time.  Skin: Skin is warm and dry. She is not diaphoretic.  Psychiatric: She has a normal mood and affect. Her behavior is normal.  Assessment & Plan:   Tobin was seen today for establish care.  Diagnoses and all orders for this visit:  Chronic diastolic CHF (congestive heart failure) ([place])  CKD (chronic kidney disease), stage IV ([place])  Type 2 diabetes mellitus with other specified complication, with long-term current use of insulin ([place]) -     Ambulatory referral to Endocrinology -     Ambulatory referral to diabetic education -     Hemoglobin A1c -     Ambulatory referral to diabetic education  Low back pain, unspecified back pain laterality, unspecified chronicity, with sciatica presence unspecified -     traMADol (ULTRAM) 50 MG tablet; Take 1 tablet (50 mg total) by mouth every 6 (six) hours as needed.  Pressure injury of skin of contiguous region involving back and buttock, unspecified injury stage, unspecified laterality -     traMADol (ULTRAM) 50 MG tablet; Take 1 tablet (50 mg total) by  mouth every 6 (six) hours as needed.  Chronic low back pain, unspecified back pain laterality, with sciatica presence unspecified -     traMADol (ULTRAM) 50 MG tablet; Take 1 tablet (50 mg total) by mouth every 6 (six) hours as needed. -     methocarbamol (ROBAXIN) 500 MG tablet; Take 1 tablet (500 mg total) by mouth every 8 (eight) hours as needed for muscle spasms.   I have discontinued Lasheka Kempner. Weathers's traMADol. I am also having her start on traMADol and methocarbamol. Additionally, I am having her maintain her Glucosamine-Chondroitin, acetaminophen, Multiple Vitamins-Minerals (PRESERVISION AREDS 2 PO), polyethylene glycol, ONE TOUCH ULTRA SYSTEM KIT, loratadine, Melatonin, ONETOUCH DELICA LANCETS 82U, ONE TOUCH ULTRA TEST, fluocinonide cream, insulin regular, insulin NPH Human, atorvastatin, fenofibrate, ONETOUCH DELICA LANCETS FINE, metolazone, NIFEdipine, isosorbide mononitrate, spironolactone, cloNIDine, apixaban, hydrALAZINE, bumetanide, nebivolol, and losartan.  Meds ordered this encounter  Medications  . traMADol (ULTRAM) 50 MG tablet    Sig: Take 1 tablet (50 mg total) by mouth every 6 (six) hours as needed.    Dispense:  120 tablet    Refill:  2  . methocarbamol (ROBAXIN) 500 MG tablet    Sig: Take 1 tablet (500 mg total) by mouth every 8 (eight) hours as needed for muscle spasms.    Dispense:  60 tablet    Refill:  1   While patient has been dealing with diabetes for quite some time now, I think a referral for diabetic teaching would be in order to help her learn how to count carbs again and to adjust her regular insulin appropriately before each meal.  Review of the chart did show controlled diabetes with a hemoglobin A1c of 5.8 measured back in October.  I am not certain that endocrinology will have much to offer her a referral might be helpful.  She is unable to afford newer insulins at this time.  Follow-up: Return in about 2 months (around 07/05/2017).  Libby Maw,  MD

## 2017-05-12 ENCOUNTER — Ambulatory Visit: Payer: Self-pay

## 2017-05-12 MED ORDER — METHOCARBAMOL 500 MG PO TABS: 500.0000 mg | ORAL_TABLET | Freq: Three times a day (TID) | ORAL | 1 refills | Status: AC | PRN

## 2017-05-12 NOTE — Telephone Encounter (Signed)
I sent a prescription in for robaxin. When was the last time that she had physical therapy?

## 2017-05-12 NOTE — Telephone Encounter (Signed)
Patient is requesting a mild muscle relaxer to help with the muscle pain in her right thigh. Patient said that the Tramadol did not help.   Please advise, thanks!

## 2017-05-12 NOTE — Telephone Encounter (Signed)
   Reason for Disposition . Caused by strained muscle  Answer Assessment - Initial Assessment Questions 1. ONSET: "When did the pain start?"      Started last night 2. LOCATION: "Where is the pain located?"      Right thigh 3. PAIN: "How bad is the pain?"    (Scale 1-10; or mild, moderate, severe)   -  MILD (1-3): doesn't interfere with normal activities    -  MODERATE (4-7): interferes with normal activities (e.g., work or school) or awakens from sleep, limping    -  SEVERE (8-10): excruciating pain, unable to do any normal activities, unable to walk     8 4. WORK OR EXERCISE: "Has there been any recent work or exercise that involved this part of the body?"      No 5. CAUSE: "What do you think is causing the leg pain?"     Muscle spasm 6. OTHER SYMPTOMS: "Do you have any other symptoms?" (e.g., chest pain, back pain, breathing difficulty, swelling, rash, fever, numbness, weakness)     No 7. PREGNANCY: "Is there any chance you are pregnant?" "When was your last menstrual period?"     No  Protocols used: LEG PAIN-A-AH  Sleeps in chair at night. Tramadol did not help.

## 2017-05-12 NOTE — Addendum Note (Signed)
Addended by: Jon Billings on: 05/12/2017 04:33 PM   Modules accepted: Orders

## 2017-05-13 NOTE — Telephone Encounter (Signed)
I called and spoke with patient. I let her know that the prior authorization has been approved for the Robaxin & that the pharmacy should have it ready soon. Patient said that she hasn't had physical therapy in a while, but it is something that she can not do.

## 2017-05-13 NOTE — Telephone Encounter (Signed)
Attempted to contact patient, line was hung up. Will try again later on. A PA has been submitted for the Robaxin.

## 2017-05-14 ENCOUNTER — Other Ambulatory Visit: Payer: Self-pay

## 2017-05-14 ENCOUNTER — Emergency Department (HOSPITAL_COMMUNITY): Payer: Medicare Other

## 2017-05-14 ENCOUNTER — Encounter (HOSPITAL_COMMUNITY): Payer: Self-pay | Admitting: Emergency Medicine

## 2017-05-14 ENCOUNTER — Emergency Department (HOSPITAL_COMMUNITY)
Admission: EM | Admit: 2017-05-14 | Discharge: 2017-05-14 | Disposition: A | Discharge status: Home / Self Care | Payer: Medicare Other | Attending: Emergency Medicine | Admitting: Emergency Medicine

## 2017-05-14 DIAGNOSIS — Y929 Unspecified place or not applicable: Secondary | ICD-10-CM | POA: Insufficient documentation

## 2017-05-14 DIAGNOSIS — Y999 Unspecified external cause status: Secondary | ICD-10-CM | POA: Diagnosis not present

## 2017-05-14 DIAGNOSIS — Z794 Long term (current) use of insulin: Secondary | ICD-10-CM | POA: Insufficient documentation

## 2017-05-14 DIAGNOSIS — Z79899 Other long term (current) drug therapy: Secondary | ICD-10-CM | POA: Insufficient documentation

## 2017-05-14 DIAGNOSIS — X509XXA Other and unspecified overexertion or strenuous movements or postures, initial encounter: Secondary | ICD-10-CM | POA: Diagnosis not present

## 2017-05-14 DIAGNOSIS — I10 Essential (primary) hypertension: Secondary | ICD-10-CM | POA: Diagnosis not present

## 2017-05-14 DIAGNOSIS — E119 Type 2 diabetes mellitus without complications: Secondary | ICD-10-CM | POA: Insufficient documentation

## 2017-05-14 DIAGNOSIS — S8991XA Unspecified injury of right lower leg, initial encounter: Secondary | ICD-10-CM | POA: Diagnosis present

## 2017-05-14 DIAGNOSIS — S8011XA Contusion of right lower leg, initial encounter: Secondary | ICD-10-CM | POA: Diagnosis not present

## 2017-05-14 DIAGNOSIS — Y939 Activity, unspecified: Secondary | ICD-10-CM | POA: Insufficient documentation

## 2017-05-14 DIAGNOSIS — R52 Pain, unspecified: Secondary | ICD-10-CM

## 2017-05-14 DIAGNOSIS — Z87891 Personal history of nicotine dependence: Secondary | ICD-10-CM | POA: Insufficient documentation

## 2017-05-14 DIAGNOSIS — M5431 Sciatica, right side: Secondary | ICD-10-CM

## 2017-05-14 LAB — BASIC METABOLIC PANEL
Anion gap: 12 (ref 5–15)
BUN: 48 mg/dL — AB (ref 6–20)
CALCIUM: 9.4 mg/dL (ref 8.9–10.3)
CHLORIDE: 103 mmol/L (ref 101–111)
CO2: 22 mmol/L (ref 22–32)
CREATININE: 2.36 mg/dL — AB (ref 0.44–1.00)
GFR calc Af Amer: 21 mL/min — ABNORMAL LOW (ref >60)
GFR calc non Af Amer: 18 mL/min — ABNORMAL LOW (ref >60)
Glucose, Bld: 101 mg/dL — ABNORMAL HIGH (ref 65–99)
Potassium: 4 mmol/L (ref 3.5–5.1)
SODIUM: 137 mmol/L (ref 135–145)

## 2017-05-14 LAB — CBC WITH DIFFERENTIAL/PLATELET
Basophils Absolute: 0 10*3/uL (ref 0.0–0.1)
Basophils Relative: 0 %
EOS ABS: 0.1 10*3/uL (ref 0.0–0.7)
Eosinophils Relative: 2 %
HEMATOCRIT: 26.8 % — AB (ref 36.0–46.0)
Hemoglobin: 8.3 g/dL — ABNORMAL LOW (ref 12.0–15.0)
LYMPHS ABS: 0.8 10*3/uL (ref 0.7–4.0)
Lymphocytes Relative: 13 %
MCH: 28.7 pg (ref 26.0–34.0)
MCHC: 31 g/dL (ref 30.0–36.0)
MCV: 92.7 fL (ref 78.0–100.0)
MONO ABS: 0.4 10*3/uL (ref 0.1–1.0)
MONOS PCT: 7 %
Neutro Abs: 5.2 10*3/uL (ref 1.7–7.7)
Neutrophils Relative %: 78 %
Platelets: 324 10*3/uL (ref 150–400)
RBC: 2.89 MIL/uL — ABNORMAL LOW (ref 3.87–5.11)
RDW: 14.6 % (ref 11.5–15.5)
WBC: 6.6 10*3/uL (ref 4.0–10.5)

## 2017-05-14 MED ORDER — HYDROMORPHONE HCL 1 MG/ML IJ SOLN
0.5000 mg | Freq: Once | INTRAMUSCULAR | Status: AC
  Administered 2017-05-14: 0.5 mg via INTRAVENOUS
  Filled 2017-05-14: qty 1

## 2017-05-14 MED ORDER — HYDROMORPHONE HCL 1 MG/ML IJ SOLN
0.5000 mg | Freq: Once | INTRAMUSCULAR | Status: AC
Start: 2017-05-14 — End: 2017-05-14
  Administered 2017-05-14: 0.5 mg via INTRAVENOUS
  Filled 2017-05-14: qty 1

## 2017-05-14 MED ORDER — ONDANSETRON 4 MG PO TBDP: 4.0000 mg | ORAL_TABLET | Freq: Four times a day (QID) | ORAL | 0 refills | Status: AC | PRN

## 2017-05-14 MED ORDER — DOCUSATE SODIUM 100 MG PO CAPS: 100.0000 mg | ORAL_CAPSULE | Freq: Two times a day (BID) | ORAL | 0 refills | Status: AC

## 2017-05-14 MED ORDER — OXYCODONE-ACETAMINOPHEN 5-325 MG PO TABS: 2.0000 | ORAL_TABLET | Freq: Four times a day (QID) | ORAL | 0 refills | Status: DC | PRN

## 2017-05-14 MED ORDER — MORPHINE SULFATE (PF) 4 MG/ML IV SOLN
4.0000 mg | Freq: Once | INTRAVENOUS | Status: AC
Start: 2017-05-14 — End: 2017-05-14
  Administered 2017-05-14: 4 mg via INTRAVENOUS
  Filled 2017-05-14: qty 1

## 2017-05-14 MED ORDER — ONDANSETRON HCL 4 MG/2ML IJ SOLN
4.0000 mg | Freq: Once | INTRAMUSCULAR | Status: AC
  Administered 2017-05-14: 4 mg via INTRAVENOUS
  Filled 2017-05-14: qty 2

## 2017-05-14 NOTE — ED Notes (Signed)
Pt refuses to go to xray because she cannot lie flat on the table due to pain will notify MD

## 2017-05-14 NOTE — ED Provider Notes (Addendum)
TIME SEEN: 1:15 AM  CHIEF COMPLAINT: Right posterior thigh pain  HPI: Patient is an 84 year old female with history of hypertension, hyperlipidemia, diabetes, atrial fibrillation on Eliquis who presents to the emergency department with complaints of right posterior leg pain.  States symptoms started 2 nights ago.  Denies any known injury.  Pain worse with movement, palpation and ambulation.  Family reports and difficulty with ambulation at baseline.  She denies any fevers, redness or warmth.  No numbness or tingling that is abnormal for her.  She does have chronic neuropathy in both legs.  No bowel or bladder incontinence.  No complaints of back pain at this time.  ROS: See HPI Constitutional: no fever  Eyes: no drainage  ENT: no runny nose   Cardiovascular:  no chest pain  Resp: no SOB  GI: no vomiting GU: no dysuria Integumentary: no rash  Allergy: no hives  Musculoskeletal: no leg swelling  Neurological: no slurred speech ROS otherwise negative  PAST MEDICAL HISTORY/PAST SURGICAL HISTORY:  Past Medical History:  Diagnosis Date  . CATARACTS, SENILE, NUCLEAR, BILATERAL 01/01/2010  . Dermatophytosis of the body 11/30/2007  . DIABETES MELLITUS II, UNCOMPLICATED 8/59/2924  . Dysrhythmia    atrial fibrilation  . FATIGUE 11/22/2007  . HYPERCHOLESTEROLEMIA 06/24/2006  . Hyperpotassemia   . HYPERTENSION, BENIGN SYSTEMIC 06/24/2006  . INGROWN TOENAIL, INFECTED 04/10/2008  . MACULAR DEGENERATION, SENILE, NONEXUDATIVE 01/01/2010  . MUSCLE SPASM, TRAPEZIUS 04/02/2010  . NEOP, MALIGNANT, SKIN NOS 11/08/2006  . OSTEOARTHRITIS, LOWER LEG 06/24/2006  . PAROXYSMAL ATRIAL FIBRILLATION 09/29/2008  . PERIPHERAL NEUROPATHY, LOWER EXTREMITY 11/08/2006  . Proteinuria 05/17/2008  . RENAL FAILURE 11/22/2007  . RENAL INSUFFICIENCY, CHRONIC 04/10/2008  . SMALL BOWEL OBSTRUCTION 11/05/2008  . SYNDROME, CARPAL TUNNEL 11/08/2006    MEDICATIONS:  Prior to Admission medications   Medication Sig Start Date End Date  Taking? Authorizing Provider  acetaminophen (TYLENOL) 500 MG tablet Take 1,000 mg by mouth 3 (three) times daily.     [provider]  apixaban (ELIQUIS) 2.5 MG TABS tablet Take 1 tablet (2.5 mg total) by mouth 2 (two) times daily. 04/19/17   Larey Dresser, MD  atorvastatin (LIPITOR) 40 MG tablet TAKE 1 TABLET(40 MG) BY MOUTH AT BEDTIME 03/16/16   Carollee Herter, Alferd Apa, DO  Blood Glucose Monitoring Suppl (ONE TOUCH ULTRA SYSTEM KIT) W/DEVICE KIT 1 kit by Does not apply route once. E11.9 11/05/14   Midge Minium, MD  bumetanide (BUMEX) 1 MG tablet TAKE 2 TABLETS(2 MG) BY MOUTH TWICE DAILY 04/28/17   Larey Dresser, MD  cloNIDine (CATAPRES) 0.3 MG tablet Take 1.5 tablets (0.45 mg total) by mouth 3 (three) times daily. 04/01/17   Larey Dresser, MD  fenofibrate (TRICOR) 48 MG tablet Take 1 tablet (48 mg total) by mouth daily. 07/10/16   Larey Dresser, MD  fluocinonide cream (LIDEX) 0.05 % APPLY EXTERNALLY TO THE AFFECTED AREA TWICE DAILY 07/29/15   Carollee Herter, Alferd Apa, DO  Glucosamine-Chondroitin (OSTEO BI-FLEX REGULAR STRENGTH) 250-200 MG TABS Take 1 tablet by mouth 2 (two) times daily.     [provider]  hydrALAZINE (APRESOLINE) 100 MG tablet Take 1 tablet (100 mg total) by mouth 3 (three) times daily. 04/23/17   Larey Dresser, MD  insulin NPH Human (HUMULIN N,NOVOLIN N) 100 UNIT/ML injection Inject 0.3 mLs (30 Units total) into the skin at bedtime. 08/01/15   Ann Held, DO  insulin regular (NOVOLIN R,HUMULIN R) 100 units/mL injection 15 units in the morning,  15 units midday and 20 units in the evening Patient taking differently: 15 units in the morning, 15 units midday and 15 units in the evening 07/30/15   Carollee Herter, Alferd Apa, DO  isosorbide mononitrate (IMDUR) 30 MG 24 hr tablet Take 1 tablet (30 mg total) by mouth daily. 01/06/17   Larey Dresser, MD  loratadine (CLARITIN) 10 MG tablet Take 10 mg by mouth daily as needed for allergies.     [provider]  losartan (COZAAR) 100 MG tablet Take 1 tablet (100 mg total) by mouth daily. 05/03/17   Larey Dresser, MD  Melatonin 1 MG TABS Take 1 mg by mouth at bedtime.    [provider]  methocarbamol (ROBAXIN) 500 MG tablet Take 1 tablet (500 mg total) by mouth every 8 (eight) hours as needed for muscle spasms. 05/12/17   Libby Maw, MD  metolazone (ZAROXOLYN) 2.5 MG tablet Take 1 tablet (2.5 mg total) by mouth as directed. Call CHF clinic before taking. Patient not taking: Reported on 01/07/2017 10/07/16 01/05/17  Shirley Friar, PA-C  Multiple Vitamins-Minerals (PRESERVISION AREDS 2 PO) Take 1 capsule by mouth 2 (two) times daily.     [provider]  nebivolol (BYSTOLIC) 2.5 MG tablet Take 1 tablet (2.5 mg total) by mouth daily. 05/03/17   Larey Dresser, MD  NIFEdipine (PROCARDIA XL/ADALAT-CC) 60 MG 24 hr tablet Take 2 tablets (120 mg total) by mouth every morning. 11/24/16   Bensimhon, Shaune Pascal, MD  ONE [facility] ULTRA TEST test strip TEST TWICE DAILY AS DIRECTED 06/24/15   Midge Minium, MD  [facility] DELICA LANCETS 12W MISC Use one lancet each time sugars are checked. Pt tests twice daily. 05/07/15   Midge Minium, MD  [facility] DELICA LANCETS FINE MISC USE TWICE DAILY 07/13/16   Carollee Herter, Alferd Apa, DO  polyethylene glycol [facility] / GLYCOLAX) packet Take 17 g by mouth 2 (two) times daily as needed for mild constipation.    [provider]  spironolactone (ALDACTONE) 25 MG tablet Take 0.5 tablets (12.5 mg total) by mouth daily. 01/12/17 04/14/17  Larey Dresser, MD  traMADol (ULTRAM) 50 MG tablet Take 1 tablet (50 mg total) by mouth every 6 (six) hours as needed. 05/07/17   Libby Maw, MD    ALLERGIES:  Allergies  Allergen Reactions  . Aranesp (Albumin Free) [Darbepoetin Alfa] Other (See Comments)    Headaches, lower extremity edema, nausea   . Beta Adrenergic Blockers     REACTION: depression  . Cephalexin Other  (See Comments)    unknown  . Codone [Hydrocodone] Nausea And Vomiting  . Darvon [Propoxyphene]     dizziness  . Penicillins     REACTION: hives/intestinal tract  . Warfarin Sodium     REACTION: bleeding on skin surface  . Latex Rash    SOCIAL HISTORY:  Social History   Tobacco Use  . Smoking status: Former Smoker    Last attempt to quit: 04/27/1978    Years since quitting: 39.0  . Smokeless tobacco: Never Used  Substance Use Topics  . Alcohol use: No    FAMILY HISTORY: Family History  Problem Relation Age of Onset  . Diabetes Sister   . Parkinsonism Sister     EXAM: BP (!) 156/52 (BP Location: Left Arm)   Pulse 68   Temp 98.2 F (36.8 C) (Oral)   Resp 16   Ht '5\' 6"'$  (1.676 m)   Wt 93.9 kg (207 lb)  SpO2 95%   BMI 33.41 kg/m  CONSTITUTIONAL: Alert and oriented and responds appropriately to questions.  Elderly, obese, appears very uncomfortable HEAD: Normocephalic EYES: Conjunctivae clear, pupils appear equal, EOMI ENT: normal nose; moist mucous membranes NECK: Supple, no meningismus, no nuchal rigidity, no LAD  CARD: RRR; S1 and S2 appreciated; no murmurs, no clicks, no rubs, no gallops RESP: Normal chest excursion without splinting or tachypnea; breath sounds clear and equal bilaterally; no wheezes, no rhonchi, no rales, no hypoxia or respiratory distress, speaking full sentences ABD/GI: Normal bowel sounds; non-distended; soft, non-tender, no rebound, no guarding, no peritoneal signs, no hepatosplenomegaly BACK:  The back appears normal and is non-tender to palpation, there is no CVA tenderness EXT: Patient is very tender to palpation over the posterior right thigh but there is no obvious erythema, warmth, ecchymosis or swelling.  I do not appreciate any obvious bony deformity.  No joint effusion noted.  Compartments are soft.  2+ DP pulses bilaterally.  Decreased range of motion in the right hip secondary to pain but otherwise normal ROM in all joints; otherwise  extremities are non-tender to palpation; no edema; normal capillary refill; no cyanosis, no calf tenderness or swelling    SKIN: Normal color for age and race; warm; no rash NEURO: Moves all extremities equally PSYCH: The patient's mood and manner are appropriate. Grooming and personal hygiene are appropriate.  MEDICAL DECISION MAKING: Patient here with right posterior leg pain.  Unlikely DVT given location.  I do not appreciate any obvious sign of cellulitis, abscess or hematoma on exam but exam is limited given patient's obesity.  Leg is warm and well perfused with normal pulses.  No joint effusion or sign of septic arthritis or gout.  Will obtain labs, x-rays of this leg.  Will give pain medication.  ED PROGRESS: Patient's labs show no leukocytosis.  Again she is not febrile here.  She does have chronic anemia which is likely secondary to her chronic kidney disease which appears stable.  X-rays pending.  No significant improvement with morphine.  Will give small dose of IV Dilaudid.  3:10 AM  Pt refusing to go to x-ray because she states her pain is too severe.  Will give second dose of IV Dilaudid.  Will change x-rays to portable.  5:50 AM  Pt's x-ray showed no acute abnormality.  She now is able to give me a better history and states that she has had a months worth of chronic back pain that is radiated into her buttock and now into her posterior hip.  I think this could be sciatica or muscle spasm due to compensation of how she is walking.  She is mostly bedbound at baseline but does use a walker just to get to the bathroom.  Her son confirms that she is mostly nonambulatory normally.  Reports that she was actually seen by her PCP last week for this and was given tramadol but states that it was not helping.  She states that she felt like the tramadol made her dizzy and because of the dizziness that she hit the back of her leg on the toilet 2 days ago which worsened her symptoms.  Again there is no  obvious sign of hematoma and nothing that appears to be expanding on exam.  She has arthritis on her x-rays but no fractures.  I feel she is safe to be discharged.  She plans to follow-up with her primary care physician today.  Will discharge with prescription for Percocet, Zofran  and Colace.  Have advised her to stop taking her tramadol.  We discussed at length return precautions.  Patient's husband reports that he is with her 24/7.  They both feel comfortable with the plan for discharge.   At this time, I do not feel there is any life-threatening condition present. I have reviewed and discussed all results (EKG, imaging, lab, urine as appropriate) and exam findings with patient/family. I have reviewed nursing notes and appropriate previous records.  I feel the patient is safe to be discharged home without further emergent workup and can continue workup as an outpatient as needed. Discussed usual and customary return precautions. Patient/family verbalize understanding and are comfortable with this plan.  Outpatient follow-up has been provided if needed. All questions have been answered.    Narjis Mira, Delice Bison, DO 05/14/17 [place], Delice Bison, DO 05/14/17 [phone]

## 2017-05-14 NOTE — ED Triage Notes (Signed)
Pt from home began to have right leg pain starting Tuesday, tonight the pain has become "unbearable" and has moved down her leg to her knee. Pt thigh is warm to the touch, appears to be swollen, pt is on Eliquis.  According to EMS, pt is "basically nonambulatory at home and does have skin breakdown on her backside."  Pt could stand and pivot.

## 2017-05-14 NOTE — Discharge Instructions (Signed)
Please stop taking your tramadol.  Please start taking Percocet instead for pain control.  Please follow-up closely with your primary care physician.

## 2017-05-14 NOTE — ED Notes (Signed)
Pt stats dropped to 88% on RA pt placed 2L [place], stats went up to 98%.

## 2017-05-17 ENCOUNTER — Telehealth (HOSPITAL_COMMUNITY): Payer: Self-pay | Admitting: *Deleted

## 2017-05-17 ENCOUNTER — Telehealth: Payer: Self-pay | Admitting: Family Medicine

## 2017-05-17 ENCOUNTER — Ambulatory Visit: Payer: Medicare Other | Admitting: Family Medicine

## 2017-05-17 ENCOUNTER — Encounter: Payer: Self-pay | Admitting: Family Medicine

## 2017-05-17 VITALS — BP 164/82 | HR 86 | Ht 66.0 in | Wt 207.0 lb

## 2017-05-17 DIAGNOSIS — M79604 Pain in right leg: Secondary | ICD-10-CM | POA: Diagnosis not present

## 2017-05-17 DIAGNOSIS — M79605 Pain in left leg: Secondary | ICD-10-CM

## 2017-05-17 DIAGNOSIS — T451X5A Adverse effect of antineoplastic and immunosuppressive drugs, initial encounter: Secondary | ICD-10-CM | POA: Diagnosis not present

## 2017-05-17 DIAGNOSIS — G8929 Other chronic pain: Secondary | ICD-10-CM | POA: Diagnosis not present

## 2017-05-17 DIAGNOSIS — R110 Nausea: Secondary | ICD-10-CM

## 2017-05-17 DIAGNOSIS — K5901 Slow transit constipation: Secondary | ICD-10-CM

## 2017-05-17 DIAGNOSIS — M545 Low back pain: Secondary | ICD-10-CM | POA: Diagnosis not present

## 2017-05-17 MED ORDER — OXYCODONE-ACETAMINOPHEN 5-325 MG PO TABS
1.0000 | ORAL_TABLET | Freq: Four times a day (QID) | ORAL | 0 refills | Status: DC | PRN
Start: 2017-05-17 — End: 2017-05-23

## 2017-05-17 MED ORDER — ONDANSETRON HCL 4 MG PO TABS: 4.0000 mg | ORAL_TABLET | Freq: Three times a day (TID) | ORAL | 3 refills | Status: AC | PRN

## 2017-05-17 MED ORDER — OXYCODONE-ACETAMINOPHEN 5-325 MG PO TABS: 1.0000 | ORAL_TABLET | Freq: Four times a day (QID) | ORAL | 0 refills | Status: DC | PRN

## 2017-05-17 NOTE — Progress Notes (Signed)
Subjective:  Patient ID: Shelia Woods, female    DOB: Mar 09, 1931  Age: 84 y.o. MRN: [id number]  CC: Hospitalization Follow-up   HPI Shelia Woods presents for follow-up of ER visit with pain involving her back and lower extremities.  Ultram was not relieving her pain and she had to proceed to the ER.  She was given a prescription of Percocet 5/3-325 and instructed to take 1-2 of those every 6 hours as needed.  She tells me that she needs at least 2 pills every 6 hours on an ongoing basis.  She has a history of nausea with vomiting when she takes hydrocodone and has been having to take Zofran regularly with this.  Also present today were her husband and caregiver.  Outpatient Medications Prior to Visit  Medication Sig Dispense Refill  . acetaminophen (TYLENOL) 500 MG tablet Take 1,000 mg by mouth 3 (three) times daily.     Marland Kitchen apixaban (ELIQUIS) 2.5 MG TABS tablet Take 1 tablet (2.5 mg total) by mouth 2 (two) times daily. 60 tablet 5  . atorvastatin (LIPITOR) 40 MG tablet TAKE 1 TABLET(40 MG) BY MOUTH AT BEDTIME 90 tablet 1  . Blood Glucose Monitoring Suppl (ONE TOUCH ULTRA SYSTEM KIT) W/DEVICE KIT 1 kit by Does not apply route once. E11.9 1 each 0  . bumetanide (BUMEX) 1 MG tablet TAKE 2 TABLETS(2 MG) BY MOUTH TWICE DAILY 120 tablet 0  . cloNIDine (CATAPRES) 0.3 MG tablet Take 1.5 tablets (0.45 mg total) by mouth 3 (three) times daily. 135 tablet 5  . docusate sodium (COLACE) 100 MG capsule Take 1 capsule (100 mg total) by mouth every 12 (twelve) hours. 60 capsule 0  . fenofibrate (TRICOR) 48 MG tablet Take 1 tablet (48 mg total) by mouth daily. 90 tablet 3  . fluocinonide cream (LIDEX) 0.05 % APPLY EXTERNALLY TO THE AFFECTED AREA TWICE DAILY 120 g 0  . Glucosamine-Chondroitin (OSTEO BI-FLEX REGULAR STRENGTH) 250-200 MG TABS Take 1 tablet by mouth 2 (two) times daily.     . hydrALAZINE (APRESOLINE) 100 MG tablet Take 1 tablet (100 mg total) by mouth 3 (three) times daily. 90 tablet 6  .  insulin NPH Human (HUMULIN N,NOVOLIN N) 100 UNIT/ML injection Inject 0.3 mLs (30 Units total) into the skin at bedtime. 3 vial 3  . insulin regular (NOVOLIN R,HUMULIN R) 100 units/mL injection 15 units in the morning, 15 units midday and 20 units in the evening (Patient taking differently: 15 units in the morning, 15 units midday and 15 units in the evening) 10 mL 2  . isosorbide mononitrate (IMDUR) 30 MG 24 hr tablet Take 1 tablet (30 mg total) by mouth daily. 90 tablet 3  . loratadine (CLARITIN) 10 MG tablet Take 10 mg by mouth daily as needed for allergies.     Marland Kitchen losartan (COZAAR) 100 MG tablet Take 1 tablet (100 mg total) by mouth daily. 30 tablet 11  . Melatonin 1 MG TABS Take 1 mg by mouth at bedtime.    . methocarbamol (ROBAXIN) 500 MG tablet Take 1 tablet (500 mg total) by mouth every 8 (eight) hours as needed for muscle spasms. 60 tablet 1  . Multiple Vitamins-Minerals (PRESERVISION AREDS 2 PO) Take 1 capsule by mouth 2 (two) times daily.     . nebivolol (BYSTOLIC) 2.5 MG tablet Take 1 tablet (2.5 mg total) by mouth daily. 30 tablet 3  . NIFEdipine (PROCARDIA XL/ADALAT-CC) 60 MG 24 hr tablet Take 2 tablets (120 mg total) by mouth  every morning. 180 tablet 1  . ondansetron (ZOFRAN ODT) 4 MG disintegrating tablet Take 1 tablet (4 mg total) by mouth every 6 (six) hours as needed for nausea or vomiting. 20 tablet 0  . ONE TOUCH ULTRA TEST test strip TEST TWICE DAILY AS DIRECTED 100 each 0  . ONETOUCH DELICA LANCETS 17P MISC Use one lancet each time sugars are checked. Pt tests twice daily. 100 each 12  . ONETOUCH DELICA LANCETS FINE MISC USE TWICE DAILY 100 each 11  . polyethylene glycol (MIRALAX / GLYCOLAX) packet Take 17 g by mouth 2 (two) times daily as needed for mild constipation.    Marland Kitchen oxyCODONE-acetaminophen (PERCOCET/ROXICET) 5-325 MG tablet Take 1 tablet by mouth every 6 (six) hours as needed for severe pain. 60 tablet 0  . metolazone (ZAROXOLYN) 2.5 MG tablet Take 1 tablet (2.5 mg  total) by mouth as directed. Call CHF clinic before taking. (Patient not taking: Reported on 01/07/2017) 5 tablet 0  . spironolactone (ALDACTONE) 25 MG tablet Take 0.5 tablets (12.5 mg total) by mouth daily. 45 tablet 3   No facility-administered medications prior to visit.     ROS Review of Systems  Constitutional: Negative.   Respiratory: Negative.   Cardiovascular: Negative.   Gastrointestinal: Positive for constipation. Negative for abdominal pain, anal bleeding and blood in stool.  Musculoskeletal: Positive for arthralgias, back pain and myalgias.  Skin: Negative.   Psychiatric/Behavioral: Negative.     Objective:  BP (!) 164/82 (BP Location: Left Arm, Patient Position: Sitting, Cuff Size: Normal)   Pulse 86   Ht _0  (1.676 m)   Wt 207 lb (93.9 kg)   SpO2 97%   BMI 33.41 kg/m   BP Readings from Last 3 Encounters:  05/17/17 (!) 164/82  05/14/17 (!) 176/66  05/07/17 (!) 142/68    Wt Readings from Last 3 Encounters:  05/17/17 207 lb (93.9 kg)  05/14/17 207 lb (93.9 kg)  05/07/17 210 lb (95.3 kg)    Physical Exam  Constitutional: She is oriented to person, place, and time. She appears well-developed and well-nourished. No distress.  HENT:  Head: Normocephalic and atraumatic.  Right Ear: External ear normal.  Left Ear: External ear normal.  Eyes: Right eye exhibits no discharge. Left eye exhibits no discharge. No scleral icterus.  Pulmonary/Chest: Effort normal. No stridor.  Neurological: She is alert and oriented to person, place, and time.  Skin: She is not diaphoretic.  Psychiatric: She has a normal mood and affect. Her behavior is normal.    Lab Results  Component Value Date   WBC 6.6 05/14/2017   HGB 8.3 (L) 05/14/2017   HCT 26.8 (L) 05/14/2017   PLT 324 05/14/2017   GLUCOSE 101 (H) 05/14/2017   CHOL 180 07/09/2016   TRIG 396 (H) 07/09/2016   HDL 37 (L) 07/09/2016   LDLDIRECT 93.0 06/20/2015   LDLCALC 64 07/09/2016   ALT 15 06/20/2015   AST 17  06/20/2015   NA 137 05/14/2017   K 4.0 05/14/2017   CL 103 05/14/2017   CREATININE 2.36 (H) 05/14/2017   BUN 48 (H) 05/14/2017   CO2 22 05/14/2017   TSH 2.26 02/13/2015   INR 1.1 10/17/2008   HGBA1C 6.5 06/20/2015   MICROALBUR 28.8 (H) 06/17/2009    Dg Pelvis Portable  Result Date: 05/14/2017 CLINICAL DATA:  84 year old female with right lower extremity pain. EXAM: PORTABLE PELVIS 1-2 VIEWS; PORTABLE RIGHT KNEE - 1-2 VIEW; RIGHT FEMUR PORTABLE 2 VIEW COMPARISON:  Abdominal CT dated 11/08/2013  FINDINGS: There is no acute fracture or dislocation. The bones are mildly osteopenic. There is moderate arthritic changes of the right hip as well as arthritic changes of the right knee with meniscal chondrocalcinosis. No joint effusion. Vascular calcifications noted. IMPRESSION: 1. No acute fracture or dislocation. 2. Arthritic changes of the right hip and knee. Electronically Signed   By: Anner Crete M.D.   On: 05/14/2017 04:42   Dg Knee Right Port  Result Date: 05/14/2017 CLINICAL DATA:  84 year old female with right lower extremity pain. EXAM: PORTABLE PELVIS 1-2 VIEWS; PORTABLE RIGHT KNEE - 1-2 VIEW; RIGHT FEMUR PORTABLE 2 VIEW COMPARISON:  Abdominal CT dated 11/08/2013 FINDINGS: There is no acute fracture or dislocation. The bones are mildly osteopenic. There is moderate arthritic changes of the right hip as well as arthritic changes of the right knee with meniscal chondrocalcinosis. No joint effusion. Vascular calcifications noted. IMPRESSION: 1. No acute fracture or dislocation. 2. Arthritic changes of the right hip and knee. Electronically Signed   By: Anner Crete M.D.   On: 05/14/2017 04:42   Dg Femur Portable Min 2 Views Right  Result Date: 05/14/2017 CLINICAL DATA:  84 year old female with right lower extremity pain. EXAM: PORTABLE PELVIS 1-2 VIEWS; PORTABLE RIGHT KNEE - 1-2 VIEW; RIGHT FEMUR PORTABLE 2 VIEW COMPARISON:  Abdominal CT dated 11/08/2013 FINDINGS: There is no acute  fracture or dislocation. The bones are mildly osteopenic. There is moderate arthritic changes of the right hip as well as arthritic changes of the right knee with meniscal chondrocalcinosis. No joint effusion. Vascular calcifications noted. IMPRESSION: 1. No acute fracture or dislocation. 2. Arthritic changes of the right hip and knee. Electronically Signed   By: Anner Crete M.D.   On: 05/14/2017 04:42    Assessment & Plan:   Marcheta was seen today for hospitalization follow-up.  Diagnoses and all orders for this visit:  Slow transit constipation  Chronic low back pain, unspecified back pain laterality, with sciatica presence unspecified -     oxyCODONE-acetaminophen (PERCOCET/ROXICET) 5-325 MG tablet; Take 1-2 tablets by mouth every 6 (six) hours as needed for severe pain. -     Ambulatory referral to Pain Clinic  Pain in both lower extremities -     oxyCODONE-acetaminophen (PERCOCET/ROXICET) 5-325 MG tablet; Take 1-2 tablets by mouth every 6 (six) hours as needed for severe pain. -     Ambulatory referral to Pain Clinic  Chemotherapy-induced nausea -     ondansetron (ZOFRAN) 4 MG tablet; Take 1 tablet (4 mg total) by mouth every 8 (eight) hours as needed for nausea or vomiting.   I have changed Eyvonne Left. Encalade's oxyCODONE-acetaminophen. I am also having her start on ondansetron. Additionally, I am having her maintain her Glucosamine-Chondroitin, acetaminophen, Multiple Vitamins-Minerals (PRESERVISION AREDS 2 PO), polyethylene glycol, ONE TOUCH ULTRA SYSTEM KIT, loratadine, Melatonin, ONETOUCH DELICA LANCETS 54M, ONE TOUCH ULTRA TEST, fluocinonide cream, insulin regular, insulin NPH Human, atorvastatin, fenofibrate, ONETOUCH DELICA LANCETS FINE, metolazone, NIFEdipine, isosorbide mononitrate, spironolactone, cloNIDine, apixaban, hydrALAZINE, bumetanide, nebivolol, losartan, methocarbamol, ondansetron, and docusate sodium.  Meds ordered this encounter  Medications  .  oxyCODONE-acetaminophen (PERCOCET/ROXICET) 5-325 MG tablet    Sig: Take 1-2 tablets by mouth every 6 (six) hours as needed for severe pain.    Dispense:  240 tablet    Refill:  0  . ondansetron (ZOFRAN) 4 MG tablet    Sig: Take 1 tablet (4 mg total) by mouth every 8 (eight) hours as needed for nausea or vomiting.    Dispense:  100 tablet    Refill:  3   For this patient went ahead and escalated her dose of Percocet.  Also wrote for Zofran to cover anticipated nausea.  She is already experiencing constipation with the Percocet.  She continues to use her MiraLAX as well as prescription strength Colace 100 mg twice daily.  We will have been asked for a pain management consult.  At this point I had a frank discussion with the patient and her husband about my concern of her prognosis.  I was also concerned that the husband did not realize the gravity of the situation and unfortunately was correct.  Greater than 50% of the time was spent expressing my concerns about her long-term prognosis with her declining health and need for increased pain medicine use.  Follow-up: No Follow-up on file.  Libby Maw, MD

## 2017-05-17 NOTE — Telephone Encounter (Signed)
OK to take short term

## 2017-05-17 NOTE — Telephone Encounter (Signed)
Pt.'s husband called requesting Percocet # 2 pills prescription this morning "so she won't be in pain for her appointment today."  C/o "arthritis pain to both legs." Pt. Has appointment today at 1:30. Pt. Seen in ED Friday and was given Percocet 20 tablets - pt. Has taken all of those pills. Explained to husband it takes a few days for medication refill request. Husband states she needs this now. Told husband I would send request through.

## 2017-05-17 NOTE — Telephone Encounter (Signed)
Advanced Heart Failure Triage Encounter  Patient Name: Shelia Woods  Date of Call: 05/17/17  Problem: Medication   Patient seen in the ER for right thigh pain (possible sciatica or muscle spasm.) Patient started on Oxycodone-acetaminophen 5-325mg , 1-2 Tabs every 6 hours as needed.  Patient is asking for me to message Dr. Aundra Dubin and see if this is ok to take with her heart meds?  Plan: Will send to Dr. Aundra Dubin to review and will call her back with his response.    Darron Doom, RN

## 2017-05-17 NOTE — Telephone Encounter (Signed)
Rx has been sent in, patient's spouse is aware.

## 2017-05-18 NOTE — Telephone Encounter (Signed)
I called patient back and she understands, no further questions.

## 2017-05-19 ENCOUNTER — Other Ambulatory Visit (HOSPITAL_COMMUNITY): Payer: Self-pay | Admitting: Internal Medicine

## 2017-05-21 ENCOUNTER — Telehealth: Payer: Self-pay

## 2017-05-21 NOTE — Telephone Encounter (Signed)
Patient's caregiver came into the office this afternoon to get an update on the message below. The Rx that was given for 60 tabs on 1/21 should last at least 7.5 days. Patient's caregiver made aware that the new Rx will not be able to be filled until tomorrow. Nothing further needed.

## 2017-05-21 NOTE — Telephone Encounter (Signed)
Okay for pharmacy to go ahead and fill today?  Copied from [place] [id number]. Topic: Quick Communication - See Telephone Encounter >> May 21, 2017 12:20 PM Arletha Grippe wrote: CRM for notification. See Telephone encounter for:   05/21/17. Pt has rx fro pain medication waiting at the pharmacy, she can not get it filled until tomorrow.  She is trying to get it filled today. She needs an override from the doctor to get it filled today. [phone]. Pt talked with pharmacist and said that pt needs an override

## 2017-05-23 ENCOUNTER — Other Ambulatory Visit: Payer: Self-pay

## 2017-05-23 ENCOUNTER — Encounter (HOSPITAL_COMMUNITY): Payer: Self-pay

## 2017-05-23 ENCOUNTER — Emergency Department (HOSPITAL_COMMUNITY)
Admission: EM | Admit: 2017-05-23 | Discharge: 2017-05-23 | Disposition: A | Discharge status: Home / Self Care | Payer: Medicare Other | Attending: Emergency Medicine | Admitting: Emergency Medicine

## 2017-05-23 ENCOUNTER — Emergency Department (HOSPITAL_COMMUNITY): Payer: Medicare Other

## 2017-05-23 DIAGNOSIS — Z794 Long term (current) use of insulin: Secondary | ICD-10-CM | POA: Diagnosis not present

## 2017-05-23 DIAGNOSIS — N183 Chronic kidney disease, stage 3 (moderate): Secondary | ICD-10-CM | POA: Insufficient documentation

## 2017-05-23 DIAGNOSIS — I130 Hypertensive heart and chronic kidney disease with heart failure and stage 1 through stage 4 chronic kidney disease, or unspecified chronic kidney disease: Secondary | ICD-10-CM | POA: Insufficient documentation

## 2017-05-23 DIAGNOSIS — I5032 Chronic diastolic (congestive) heart failure: Secondary | ICD-10-CM | POA: Diagnosis not present

## 2017-05-23 DIAGNOSIS — E119 Type 2 diabetes mellitus without complications: Secondary | ICD-10-CM | POA: Diagnosis not present

## 2017-05-23 DIAGNOSIS — Z91040 Latex allergy status: Secondary | ICD-10-CM | POA: Diagnosis not present

## 2017-05-23 DIAGNOSIS — Z79899 Other long term (current) drug therapy: Secondary | ICD-10-CM | POA: Insufficient documentation

## 2017-05-23 DIAGNOSIS — Z87891 Personal history of nicotine dependence: Secondary | ICD-10-CM | POA: Diagnosis not present

## 2017-05-23 DIAGNOSIS — L8943 Pressure ulcer of contiguous site of back, buttock and hip, stage 3: Secondary | ICD-10-CM | POA: Insufficient documentation

## 2017-05-23 DIAGNOSIS — Z7901 Long term (current) use of anticoagulants: Secondary | ICD-10-CM | POA: Insufficient documentation

## 2017-05-23 DIAGNOSIS — M79601 Pain in right arm: Secondary | ICD-10-CM

## 2017-05-23 LAB — I-STAT CHEM 8, ED
BUN: 53 mg/dL — ABNORMAL HIGH (ref 6–20)
CREATININE: 2.6 mg/dL — AB (ref 0.44–1.00)
Calcium, Ion: 1.17 mmol/L (ref 1.15–1.40)
Chloride: 104 mmol/L (ref 101–111)
Glucose, Bld: 134 mg/dL — ABNORMAL HIGH (ref 65–99)
HEMATOCRIT: 26 % — AB (ref 36.0–46.0)
HEMOGLOBIN: 8.8 g/dL — AB (ref 12.0–15.0)
POTASSIUM: 4.6 mmol/L (ref 3.5–5.1)
SODIUM: 138 mmol/L (ref 135–145)
TCO2: 22 mmol/L (ref 22–32)

## 2017-05-23 MED ORDER — PREDNISONE 20 MG PO TABS: 40.0000 mg | ORAL_TABLET | Freq: Every day | ORAL | 0 refills | Status: AC

## 2017-05-23 MED ORDER — DICLOFENAC EPOLAMINE 1.3 % TD PTCH
1.0000 | MEDICATED_PATCH | Freq: Once | TRANSDERMAL | Status: DC
  Administered 2017-05-23: 1 via TRANSDERMAL
  Filled 2017-05-23: qty 1

## 2017-05-23 MED ORDER — DICLOFENAC EPOLAMINE 1.3 % TD PTCH: 1.0000 | MEDICATED_PATCH | Freq: Two times a day (BID) | TRANSDERMAL | 0 refills | Status: AC

## 2017-05-23 MED ORDER — FENTANYL 25 MCG/HR TD PT72
25.0000 ug | MEDICATED_PATCH | Freq: Once | TRANSDERMAL | Status: DC
  Administered 2017-05-23: 25 ug via TRANSDERMAL
  Filled 2017-05-23: qty 1

## 2017-05-23 MED ORDER — FENTANYL CITRATE (PF) 100 MCG/2ML IJ SOLN
25.0000 ug | Freq: Once | INTRAMUSCULAR | Status: AC
  Administered 2017-05-23: 25 ug via INTRAMUSCULAR
  Filled 2017-05-23: qty 2

## 2017-05-23 MED ORDER — FENTANYL 25 MCG/HR TD PT72: 25.0000 ug | MEDICATED_PATCH | TRANSDERMAL | 0 refills | Status: DC

## 2017-05-23 NOTE — Care Management Note (Signed)
Case Management Note  Patient Details  Name: Shelia Woods MRN: [id number] Date of Birth: Jan 21, 1931  Subjective/Objective:     Patient presented to [facility] ED with arm and sacral pain           Action/Plan: CM received referral from Dr. Vanita Panda concerning [facility] recommendations. CM reviewed patient's record  CM met with patient and family at bedside permission provided to discuss care in the presence of family prior. Patient reports living at home with elderly husband and not being able to care for self due to weakness and pain. Patient has seen her PCP this month with those complaints. Patient states she can no longer ambulate only from bed to chair. Patient and family goal is SNF until patient is reconditioned. CM explained the recommended [facility] services will be able to assist with placement from home. Family wants her to be admitted into hospital for care and placement. CM explained that patient currently does not meet criteria for an hospital stay. CM asked if there was anyone who could stay with patient once discharged home today, also discussed Private Duty and explained that they could find someone independentlyl or I could provide a list with several local PCA agencies. Family stated they have someone who they pay out of pocket that comes out 2x per week. They also stated that a nurse from [facility] comes out weekly as well. Patient confirmed that she is active with [facility] [place] services. CM contacted Raquel Sarna on call nurse at  [facility] to confirm post ED visit within 24 hours, and that additional disciplines were recommended patient may need to be evaluated for STR from home [facility] social worker referral made. Patient and family agreeable with care transitional plan teach back done. Referral faxed to [facility] via [facility]. Updated Dr. Vanita Panda who is agreeable with care transition plan. No further ED CM needs identified. Patient will be discharge home from ED with family and transported in private vehicle.  Expected  Discharge Date:     05/23/2017             Expected Discharge Plan:  [place]  In-House Referral:     Discharge planning Services  CM Consult  Post Acute Care Choice:    Choice offered to:  Patient  DME Arranged:    DME Agency:     HH Arranged:  RN, Social Work, OT, Nurse's Aide, PT [place] of Service:  Completed, signed off  If discussed at [employer] of Stay Meetings, dates discussed:    Additional CommentsLaurena Slimmer, RN 05/23/2017, 4:49 PM

## 2017-05-23 NOTE — ED Notes (Signed)
Pt with ulcers noted to bilateral buttock. Allevyn wound dressing applied to pt per Dr. Vanita Panda. Extra few dressings given to pt. No purulent discharge or gross contamination noted to wounds.

## 2017-05-23 NOTE — ED Notes (Signed)
Care management at bedside.

## 2017-05-23 NOTE — ED Provider Notes (Signed)
[place] EMERGENCY DEPARTMENT Provider Note   CSN: [id number] Arrival date & time: 05/23/17  1104     History   Chief Complaint Chief Complaint  Patient presents with  . Arm Pain    HPI Shelia Woods is a 84 y.o. female.  HPI Patient presents with concern of right arm pain, ongoing discomfort from multiple decubitus ulcer. Patient is here with 2 sons who assist with the HPI. Patient was here about 1 week ago for hip pain, diagnosed with worsening arthritis, discharged. She notes that since that time she has been using OxyContin, with minimal change in her right hip pain, and with new pain in her right medial proximal arm. No new swelling in the arm, no distal loss of strength. Patient notes that she has recently been using her arm more to lift herself. Patient lives at home, with her elderly husband, and he is deconditioned as well. No new chest pain, dyspnea, vomiting. There is new constipation, with limited bowel movement 3 days ago, in spite of using multiple bowel habit medication. Patient has had no abdominal pain, however.   Past Medical History:  Diagnosis Date  . CATARACTS, SENILE, NUCLEAR, BILATERAL 01/01/2010  . Dermatophytosis of the body 11/30/2007  . DIABETES MELLITUS II, UNCOMPLICATED 3/87/5643  . Dysrhythmia    atrial fibrilation  . FATIGUE 11/22/2007  . HYPERCHOLESTEROLEMIA 06/24/2006  . Hyperpotassemia   . HYPERTENSION, BENIGN SYSTEMIC 06/24/2006  . INGROWN TOENAIL, INFECTED 04/10/2008  . MACULAR DEGENERATION, SENILE, NONEXUDATIVE 01/01/2010  . MUSCLE SPASM, TRAPEZIUS 04/02/2010  . NEOP, MALIGNANT, SKIN NOS 11/08/2006  . OSTEOARTHRITIS, LOWER LEG 06/24/2006  . PAROXYSMAL ATRIAL FIBRILLATION 09/29/2008  . PERIPHERAL NEUROPATHY, LOWER EXTREMITY 11/08/2006  . Proteinuria 05/17/2008  . RENAL FAILURE 11/22/2007  . RENAL INSUFFICIENCY, CHRONIC 04/10/2008  . SMALL BOWEL OBSTRUCTION 11/05/2008  . SYNDROME, CARPAL TUNNEL 11/08/2006    Patient Active  Problem List   Diagnosis Date Noted  . Pain in both lower extremities 05/17/2017  . Pressure injury of skin of contiguous region involving back and buttock 05/07/2017  . Chronic diastolic CHF (congestive heart failure) ([place]) 10/20/2015  . Chest pressure 01/23/2015  . PAF (paroxysmal atrial fibrillation) ([place]) 01/23/2015  . Abdominal pain 11/08/2013  . CKD (chronic kidney disease), stage III ([place]) 11/08/2013  . Colitis 11/08/2013  . General medical examination 01/13/2011  . MUSCLE SPASM, TRAPEZIUS 04/02/2010  . WOUND, OPEN, ELBOW 04/02/2010  . CONTUSION, HEAD 04/02/2010  . CONTUSION OF CHEST WALL 04/02/2010  . CONTUSION, ELBOW 04/02/2010  . CANDIDIASIS, SKIN 01/02/2010  . MACULAR DEGENERATION, SENILE, NONEXUDATIVE 01/01/2010  . CATARACTS, SENILE, NUCLEAR, BILATERAL 01/01/2010  . UTI 11/11/2009  . EDEMA- LOCALIZED 09/11/2009  . DIZZINESS 08/08/2009  . SMALL BOWEL OBSTRUCTION 11/05/2008  . PAROXYSMAL ATRIAL FIBRILLATION 09/29/2008  . RHINITIS 09/06/2008  . PROTEINURIA 05/17/2008  . CKD (chronic kidney disease), stage IV ([place]) 04/10/2008  . INGROWN TOENAIL, INFECTED 04/10/2008  . DERMATOPHYTOSIS OF THE BODY 11/30/2007  . HYPERKALEMIA 11/30/2007  . RENAL FAILURE 11/22/2007  . FATIGUE 11/22/2007  . Constipation 03/01/2007  . NEOP, MALIGNANT, SKIN NOS 11/08/2006  . SYNDROME, CARPAL TUNNEL 11/08/2006  . PERIPHERAL NEUROPATHY, LOWER EXTREMITY 11/08/2006  . Diabetes mellitus ([place]) 06/24/2006  . HYPERCHOLESTEROLEMIA 06/24/2006  . HYPERTENSION, BENIGN SYSTEMIC 06/24/2006  . Osteoarthrosis involving lower leg 06/24/2006  . Chronic low back pain 06/24/2006    Past Surgical History:  Procedure Laterality Date  . ABDOMINAL HYSTERECTOMY    . APPENDECTOMY    . CARPAL TUNNEL RELEASE    .  OOPHORECTOMY    . SPINAL FUSION     status post at least three back surgeries  . TONSILLECTOMY      OB History    No data available       Home Medications    Prior to Admission medications    Medication Sig Start Date End Date Taking? Authorizing Provider  acetaminophen (TYLENOL) 500 MG tablet Take 1,000 mg by mouth 3 (three) times daily.    Yes [provider]  apixaban (ELIQUIS) 2.5 MG TABS tablet Take 1 tablet (2.5 mg total) by mouth 2 (two) times daily. 04/19/17  Yes Larey Dresser, MD  atorvastatin (LIPITOR) 40 MG tablet TAKE 1 TABLET(40 MG) BY MOUTH AT BEDTIME 03/16/16  Yes Lowne Chase, Yvonne R, DO  bumetanide (BUMEX) 1 MG tablet TAKE 2 TABLETS(2 MG) BY MOUTH TWICE DAILY 04/28/17  Yes Larey Dresser, MD  cloNIDine (CATAPRES) 0.3 MG tablet Take 1.5 tablets (0.45 mg total) by mouth 3 (three) times daily. 04/01/17  Yes Larey Dresser, MD  fenofibrate (TRICOR) 48 MG tablet Take 1 tablet (48 mg total) by mouth daily. 07/10/16  Yes Larey Dresser, MD  Glucosamine-Chondroitin (OSTEO BI-FLEX REGULAR STRENGTH) 250-200 MG TABS Take 1 tablet by mouth 2 (two) times daily.    Yes [provider]  hydrALAZINE (APRESOLINE) 100 MG tablet Take 1 tablet (100 mg total) by mouth 3 (three) times daily. 04/23/17  Yes Larey Dresser, MD  insulin NPH Human (HUMULIN N,NOVOLIN N) 100 UNIT/ML injection Inject 0.3 mLs (30 Units total) into the skin at bedtime. 08/01/15  Yes Roma Schanz R, DO  insulin regular (NOVOLIN R,HUMULIN R) 100 units/mL injection 15 units in the morning, 15 units midday and 20 units in the evening Patient taking differently: Inject 5 Units into the skin 3 (three) times daily before meals. 5 units in the morning, 5 units midday and 5 units in the evening 07/30/15  Yes Lowne Lyndal Pulley R, DO  isosorbide mononitrate (IMDUR) 30 MG 24 hr tablet Take 1 tablet (30 mg total) by mouth daily. 01/06/17  Yes Larey Dresser, MD  loratadine (CLARITIN) 10 MG tablet Take 10 mg by mouth daily as needed for allergies.    Yes [provider]  losartan (COZAAR) 100 MG tablet Take 1 tablet (100 mg total) by mouth daily. 05/03/17  Yes Larey Dresser, MD  Melatonin 10  MG TABS Take 1 mg by mouth at bedtime.   Yes [provider]  metolazone (ZAROXOLYN) 2.5 MG tablet Take 1 tablet (2.5 mg total) by mouth as directed. Call CHF clinic before taking. 10/07/16 05/23/17 Yes Tillery, Satira Mccallum, PA-C  Multiple Vitamins-Minerals (PRESERVISION AREDS 2 PO) Take 1 capsule by mouth 2 (two) times daily.    Yes [provider]  nebivolol (BYSTOLIC) 2.5 MG tablet Take 1 tablet (2.5 mg total) by mouth daily. 05/03/17  Yes Larey Dresser, MD  NIFEdipine (PROCARDIA XL/ADALAT-CC) 60 MG 24 hr tablet TAKE 2 TABLETS BY MOUTH EVERY MORNING 05/20/17  Yes Larey Dresser, MD  ondansetron (ZOFRAN) 4 MG tablet Take 1 tablet (4 mg total) by mouth every 8 (eight) hours as needed for nausea or vomiting. 05/17/17  Yes Libby Maw, MD  oxyCODONE-acetaminophen (PERCOCET/ROXICET) 5-325 MG tablet Take 1-2 tablets by mouth every 6 (six) hours as needed for severe pain. 05/17/17 06/16/17 Yes Libby Maw, MD  polyethylene glycol [facility] / Floria Raveling) packet Take 17 g by mouth 2 (two) times daily as needed for  mild constipation.   Yes [provider]  senna (SENOKOT) 8.6 MG TABS tablet Take 2 tablets by mouth at bedtime as needed for mild constipation.   Yes [provider]  spironolactone (ALDACTONE) 25 MG tablet Take 0.5 tablets (12.5 mg total) by mouth daily. 01/12/17 05/23/17 Yes Larey Dresser, MD  Blood Glucose Monitoring Suppl (ONE TOUCH ULTRA SYSTEM KIT) W/DEVICE KIT 1 kit by Does not apply route once. E11.9 11/05/14   Midge Minium, MD  docusate sodium (COLACE) 100 MG capsule Take 1 capsule (100 mg total) by mouth every 12 (twelve) hours. Patient not taking: Reported on 05/23/2017 05/14/17   Ward, Delice Bison, DO  fluocinonide cream (LIDEX) 0.05 % APPLY EXTERNALLY TO THE AFFECTED AREA TWICE DAILY Patient not taking: Reported on 05/23/2017 07/29/15   Carollee Herter, Alferd Apa, DO  methocarbamol (ROBAXIN) 500 MG tablet Take 1 tablet (500 mg total)  by mouth every 8 (eight) hours as needed for muscle spasms. Patient not taking: Reported on 05/23/2017 05/12/17   Libby Maw, MD  ondansetron (ZOFRAN ODT) 4 MG disintegrating tablet Take 1 tablet (4 mg total) by mouth every 6 (six) hours as needed for nausea or vomiting. Patient not taking: Reported on 05/23/2017 05/14/17   Ward, Delice Bison, DO  ONE TOUCH ULTRA TEST test strip TEST TWICE DAILY AS DIRECTED 06/24/15   Midge Minium, MD  [facility] DELICA LANCETS 79T MISC Use one lancet each time sugars are checked. Pt tests twice daily. 05/07/15   Midge Minium, MD  [facility] DELICA LANCETS FINE MISC USE TWICE DAILY 07/13/16   Ann Held, DO    Family History Family History  Problem Relation Age of Onset  . Diabetes Sister   . Parkinsonism Sister     Social History Social History   Tobacco Use  . Smoking status: Former Smoker    Last attempt to quit: 04/27/1978    Years since quitting: 39.0  . Smokeless tobacco: Never Used  Substance Use Topics  . Alcohol use: No  . Drug use: No     Allergies   Aranesp (albumin free) [darbepoetin alfa]; Beta adrenergic blockers; Cephalexin; Codone [hydrocodone]; Darvon [propoxyphene]; Penicillins; Warfarin sodium; and Latex   Review of Systems Review of Systems  Constitutional:       Per HPI, otherwise negative  HENT:       Per HPI, otherwise negative  Respiratory:       Per HPI, otherwise negative  Cardiovascular:       Per HPI, otherwise negative  Gastrointestinal: Negative for vomiting.  Endocrine:       Negative aside from HPI  Genitourinary:       Neg aside from HPI   Musculoskeletal:       Per HPI, otherwise negative  Skin: Positive for wound.  Neurological: Positive for weakness. Negative for syncope.     Physical Exam Updated Vital Signs BP (!) 143/52 (BP Location: Right Arm)   Pulse 86   Temp 99.4 F (37.4 C) (Oral)   Resp 16   Ht '5\' 6"'  (1.676 m)   Wt 92.5 kg (204 lb)   SpO2 100%   BMI  32.93 kg/m   Physical Exam  Constitutional: She is oriented to person, place, and time. She has a sickly appearance. No distress.  Sickly yet obese elderly female awake and alert speaking clearly.   HENT:  Head: Normocephalic and atraumatic.  Eyes: Conjunctivae and EOM are normal.  Cardiovascular: Normal rate and regular  rhythm.  Pulmonary/Chest: Effort normal and breath sounds normal. No stridor. No respiratory distress.  Abdominal: She exhibits no distension.    Musculoskeletal: She exhibits no edema.       Right elbow: Normal.      Arms: Neurological: She is alert and oriented to person, place, and time. No cranial nerve deficit. Gait abnormal.  Patient was substantial difficulty transitioning from bed to upright positioning.  Skin: Skin is warm and dry.     Psychiatric: She has a normal mood and affect.  Nursing note and vitals reviewed.    ED Treatments / Results   Radiology Dg Shoulder Right  Result Date: 05/23/2017 CLINICAL DATA:  RIGHT shoulder pain radiating down RIGHT arm since yesterday EXAM: RIGHT SHOULDER - 2+ VIEW COMPARISON:  None FINDINGS: Diffuse osseous demineralization. AC joint alignment normal with minimal degenerative changes present. No acute fracture, dislocation, or bone destruction. Small calcifications adjacent to the RIGHT humeral head likely reflecting chronic calcific tendinitis of the rotator cuff. Visualized ribs intact. IMPRESSION: Osseous demineralization with mild AC joint degenerative changes and suspect mild chronic calcific tendinitis of the rotator cuff. No acute bony findings. Electronically Signed   By: Lavonia Dana M.D.   On: 05/23/2017 12:07    Procedures Procedures (including critical care time)  Medications Ordered in ED Medications  diclofenac (FLECTOR) 1.3 % 1 patch (1 patch Transdermal Patch Applied 05/23/17 1433)  fentaNYL (SUBLIMAZE) injection 25 mcg (25 mcg Intramuscular Given 05/23/17 1432)     Initial Impression /  Assessment and Plan / ED Course  I have reviewed the triage vital signs and the nursing notes.  Pertinent labs & imaging results that were available during my care of the patient were reviewed by me and considered in my medical decision making (see chart for details).  4:22 PM On repeat exam the patient remains in similar condition, now is in a private room, and I had a chance to evaluate her skin breakdown. No evidence for cellulitis or abscess, but there is noticeable skin breakdown. I have also discussed the patient's case with our case management team to facilitate home health services, and consideration of nursing home placement for rehabilitation. I reviewed the x-ray with the family members and the patient, with no evidence for fracture, but suspicion for progression of her arthritis and consideration of ossific tendinitis, the patient will start topical medication. Given concern for her constipation, bowel regimen will be adjusted. Though the patient does have pain in her arm, constipation, there is no evidence for obstruction, no evidence for also neurologic dysfunction. Patient's overall presentation is consistent with deconditioning, and chronic pain. This was discussed with the patient, family, and case management. We attempted to facilitate home health services, and/or rehabilitation, and with no indication for admission, the patient was discharged in stable condition.  Final Clinical Impressions(s) / ED Diagnoses  Nonhealing wound Right arm pain   Carmin Muskrat, MD 05/23/17 1624

## 2017-05-23 NOTE — Discharge Instructions (Signed)
As discussed, it is very important that he follow-up with your primary care physician, and in the coming days work with our nursing and social work Biochemist, clinical to maximize home health services, and consider nursing home placement.  Please take all medication as directed, including your prescribed bowel movement regimen.

## 2017-05-23 NOTE — ED Notes (Signed)
ED Provider at bedside. 

## 2017-05-23 NOTE — ED Triage Notes (Signed)
Pt presents for evaluation of R arm pain. Pt reports taking oxycodone for R leg pain. Pt with hx of arthritis to R shoulder. Pain started to radiate down arm last night.

## 2017-05-23 NOTE — ED Notes (Signed)
Pt and family verbalized understanding of all d/c instructions, prescriptions, and f/u information. Pt wheeled to lobby by this RN

## 2017-05-23 NOTE — ED Notes (Signed)
Dr.Lockwood at bedside  

## 2017-05-24 ENCOUNTER — Telehealth: Payer: Self-pay | Admitting: *Deleted

## 2017-05-24 ENCOUNTER — Telehealth: Payer: Self-pay

## 2017-05-24 NOTE — Telephone Encounter (Signed)
Patient is scheduled to come in tomorrow for her hospital follow up. I will go over the [facility] form with Nicki Reaper tomorrow.

## 2017-05-24 NOTE — Telephone Encounter (Signed)
Pt son called stating his mom was set up with [facility] with [facility]; but when he called to check on services, they did not receive referral.  EDCM contacted RN Burtis Junes to reestablish services.  Katrina states she will pull the orders and call pt and son promptly.  No further EDCM needs identified.  Merary Garguilo J. Clydene Laming, [place], [place], [place]

## 2017-05-24 NOTE — Telephone Encounter (Addendum)
I called and left a voicemail for patient's son Nicki Reaper. I am going to start filling out a generic FL2 form for patient, but we do need some clarification as to if patient is just getting home health or if she is going to a nursing facility. I advised him to call back and let me know if they need anything else to go along with it.

## 2017-05-24 NOTE — Telephone Encounter (Signed)
Yes, thank you. I think that she will need skilled nursing.

## 2017-05-24 NOTE — Telephone Encounter (Signed)
I have a generic copy of the FL2 form, okay to fill out for patient?      Copied from [place] [id number]. Topic: General - Other >> May 24, 2017 12:52 PM Marin Olp L wrote: Reason for CRM: Son, calling to notify that patient has calcification on her tendon, diagnosed in ER, and prescribed fentaNYL (DURAGESIC) 25 MCG/HR patch. Patient needs an assessment for placement so she is needing an FL2 form filled out to indicate what type of care she needs. Son is not sure if she needs rehab, or skilled nursing, etc. Please call back advise on the FL2 process.

## 2017-05-25 ENCOUNTER — Telehealth: Payer: Self-pay | Admitting: Family Medicine

## 2017-05-25 ENCOUNTER — Ambulatory Visit: Payer: Medicare Other | Admitting: Family Medicine

## 2017-05-25 NOTE — Telephone Encounter (Signed)
Will you be patient's attending physician?

## 2017-05-25 NOTE — Telephone Encounter (Signed)
Copied from [place] [id number]. Topic: Quick Communication - See Telephone Encounter >> May 25, 2017 12:27 PM Vernona Rieger wrote: CRM for notification. See Telephone encounter for:   05/25/17.    [facility] called and stated they received [facility] from [facility] & wanted to know if Dr Ethelene Hal could be the attending physician for this patient to have hospice care. Call back is [phone]

## 2017-05-26 NOTE — Telephone Encounter (Signed)
Copied from [place] [id number]. Topic: General - Other >> May 26, 2017  9:08 AM Lolita Rieger, RMA wrote: Reason for CRM: Amber from hospice called back and would like to know if Dr. Ethelene Hal would be the attending physician and would like to have a response today due to the pt's son being in town today and he would like to be there for the nurse when she comes for the initial meeting but they can not go out until they hear from the office please contact [phone]

## 2017-05-26 NOTE — Telephone Encounter (Signed)
I called and spoke with [facility]. They are aware that Dr. Ethelene Hal will be the attending physician.

## 2017-06-05 ENCOUNTER — Telehealth: Payer: Self-pay | Admitting: Family Medicine

## 2017-06-05 MED ORDER — FENTANYL 50 MCG/HR TD PT72: 50.0000 ug | MEDICATED_PATCH | TRANSDERMAL | 0 refills | Status: AC

## 2017-06-05 NOTE — Telephone Encounter (Signed)
Spoke with hospice nurse: changing foley cath today caused significant pain for patient. Pain is not being well controlled. Significant pain w/ any movement.   Increase fentanyl patch to 34mcg q 72 hours; reassess over weekend by home health team. Further management per PCP.

## 2017-06-09 ENCOUNTER — Telehealth: Payer: Self-pay | Admitting: Family Medicine

## 2017-06-09 NOTE — Telephone Encounter (Signed)
Copied from [place]. Topic: Quick Communication - See Telephone Encounter >> Jun 09, 2017 10:56 AM Robina Ade, Helene Kelp D wrote: CRM for notification. See Telephone encounter for: 06/09/17. Joy with [facility] called and said that patient has a stage 2 wound and it start to have a smell to it. She would like to talk to someone about this. She can be reached at [phone].

## 2017-06-09 NOTE — Telephone Encounter (Signed)
I left a voicemail for Joy to call the office back.

## 2017-06-09 NOTE — Telephone Encounter (Signed)
I spoke with Joy. She is wanting an order for Flagyl to be used for patient. They normally crush the pill up and sprinkle it over to help with the odor, this is what they normally use. They just need to know what dosage you would want to be used.

## 2017-06-18 ENCOUNTER — Ambulatory Visit: Payer: Medicare Other | Admitting: Family Medicine

## 2017-06-21 ENCOUNTER — Other Ambulatory Visit (HOSPITAL_COMMUNITY): Payer: Self-pay | Admitting: *Deleted

## 2017-06-21 MED ORDER — FENOFIBRATE 48 MG PO TABS: 48.0000 mg | ORAL_TABLET | Freq: Every day | ORAL | 3 refills | Status: AC

## 2017-06-22 IMAGING — DX DG LUMBAR SPINE COMPLETE 4+V
5 series · 5 of 5 positions shown · non-contrast
Comparison: CT 11/08/2013 .

CLINICAL DATA: Neuropathy.

EXAM:
LUMBAR SPINE - COMPLETE 4+ VIEW

[l-spine ap]
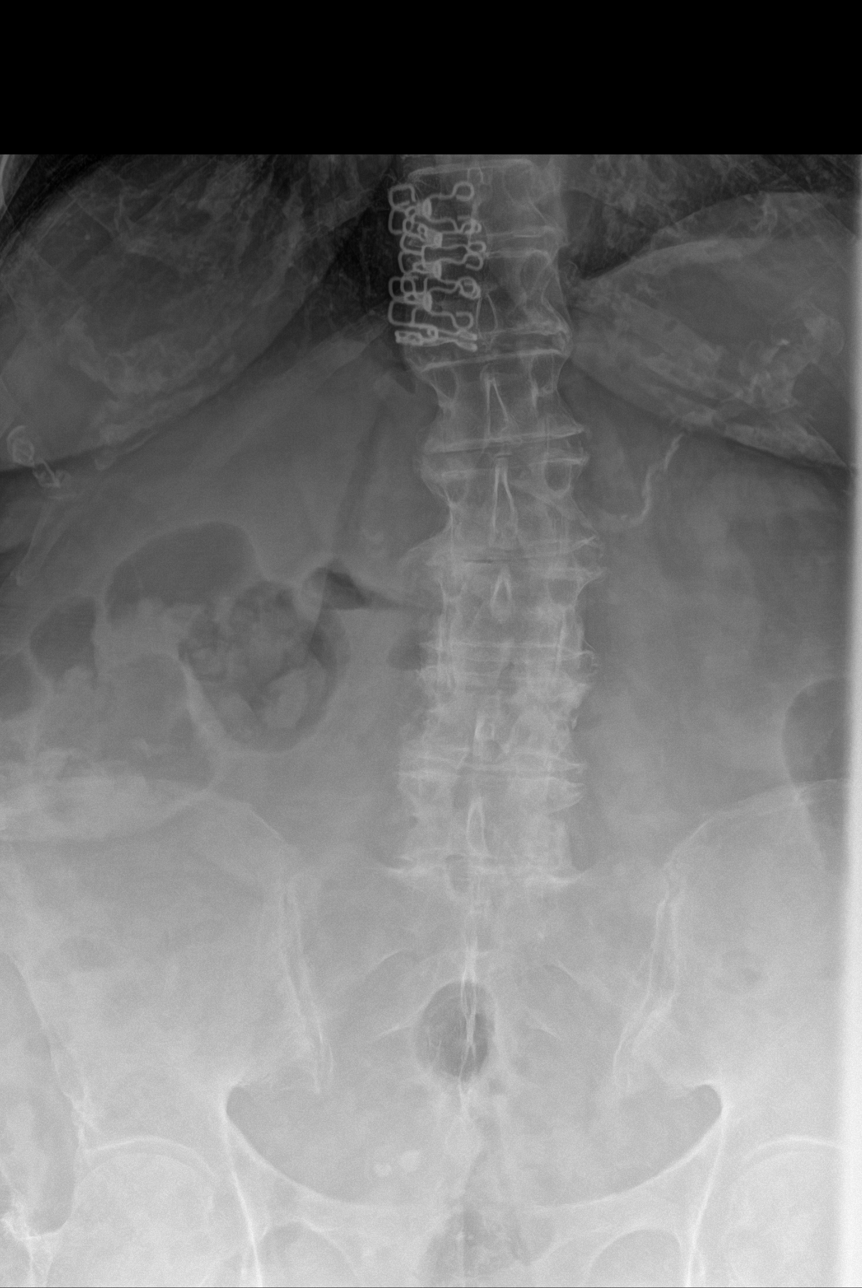

[l-spine obl (1 of 2)]
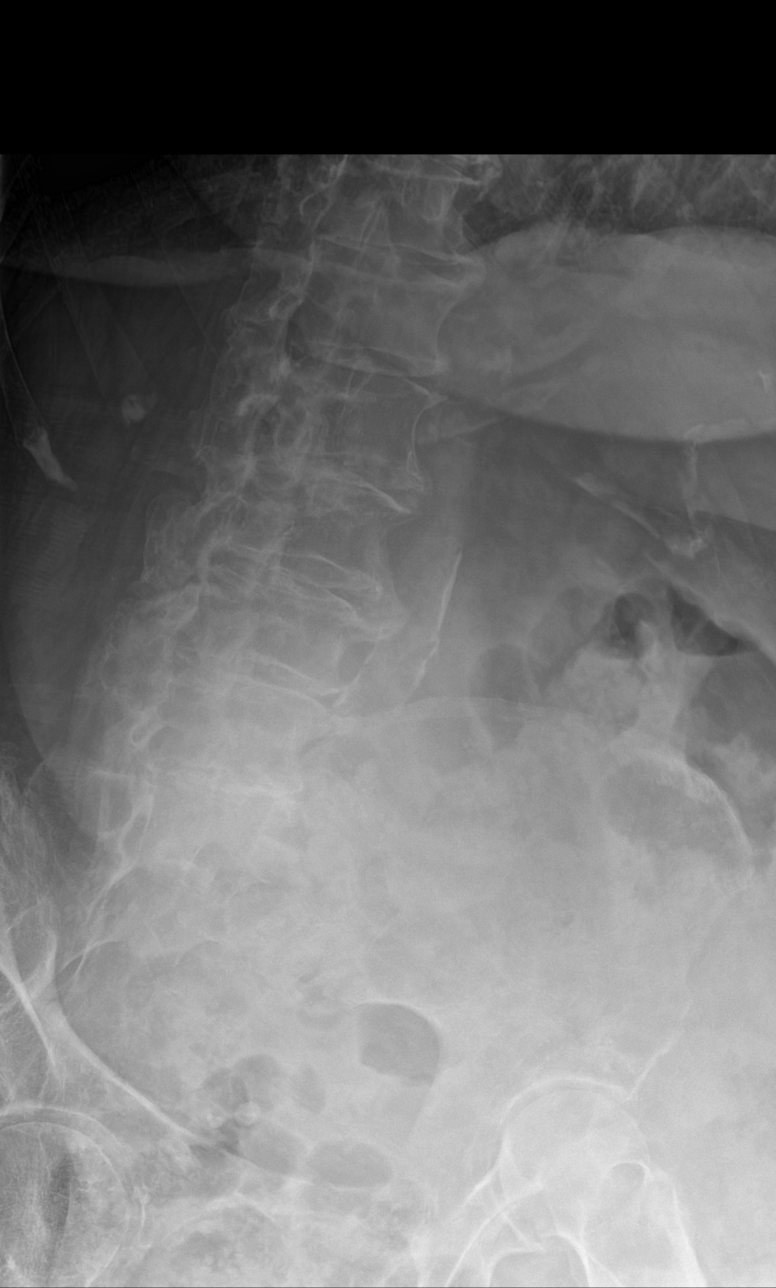

[l-spine obl (2 of 2)]
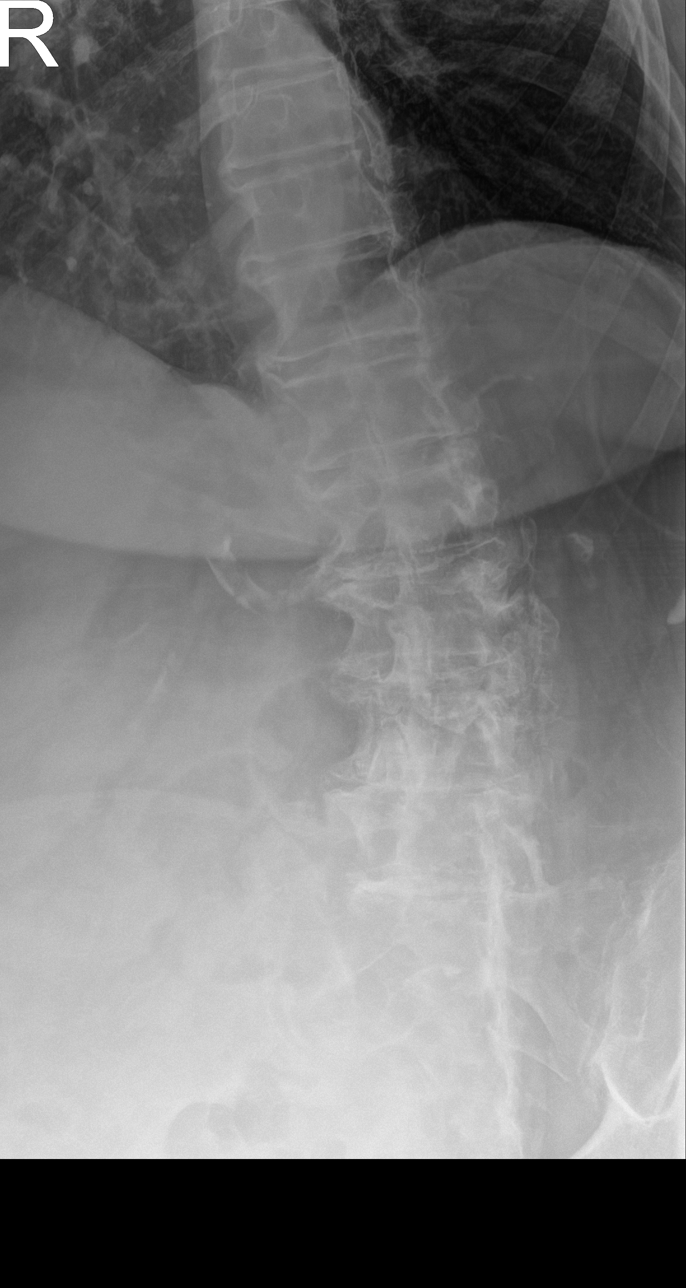

[l-spine lat]
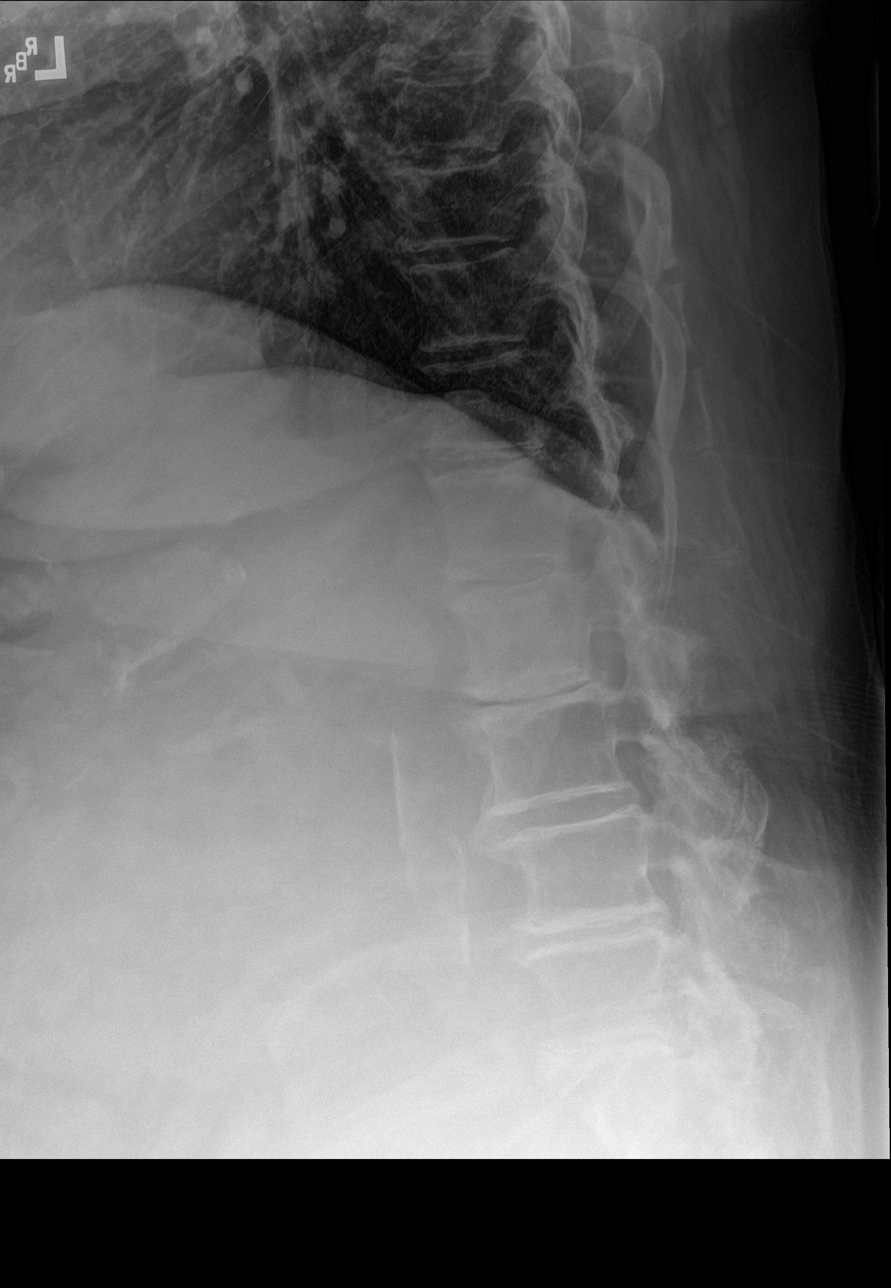

[l-spine spot]
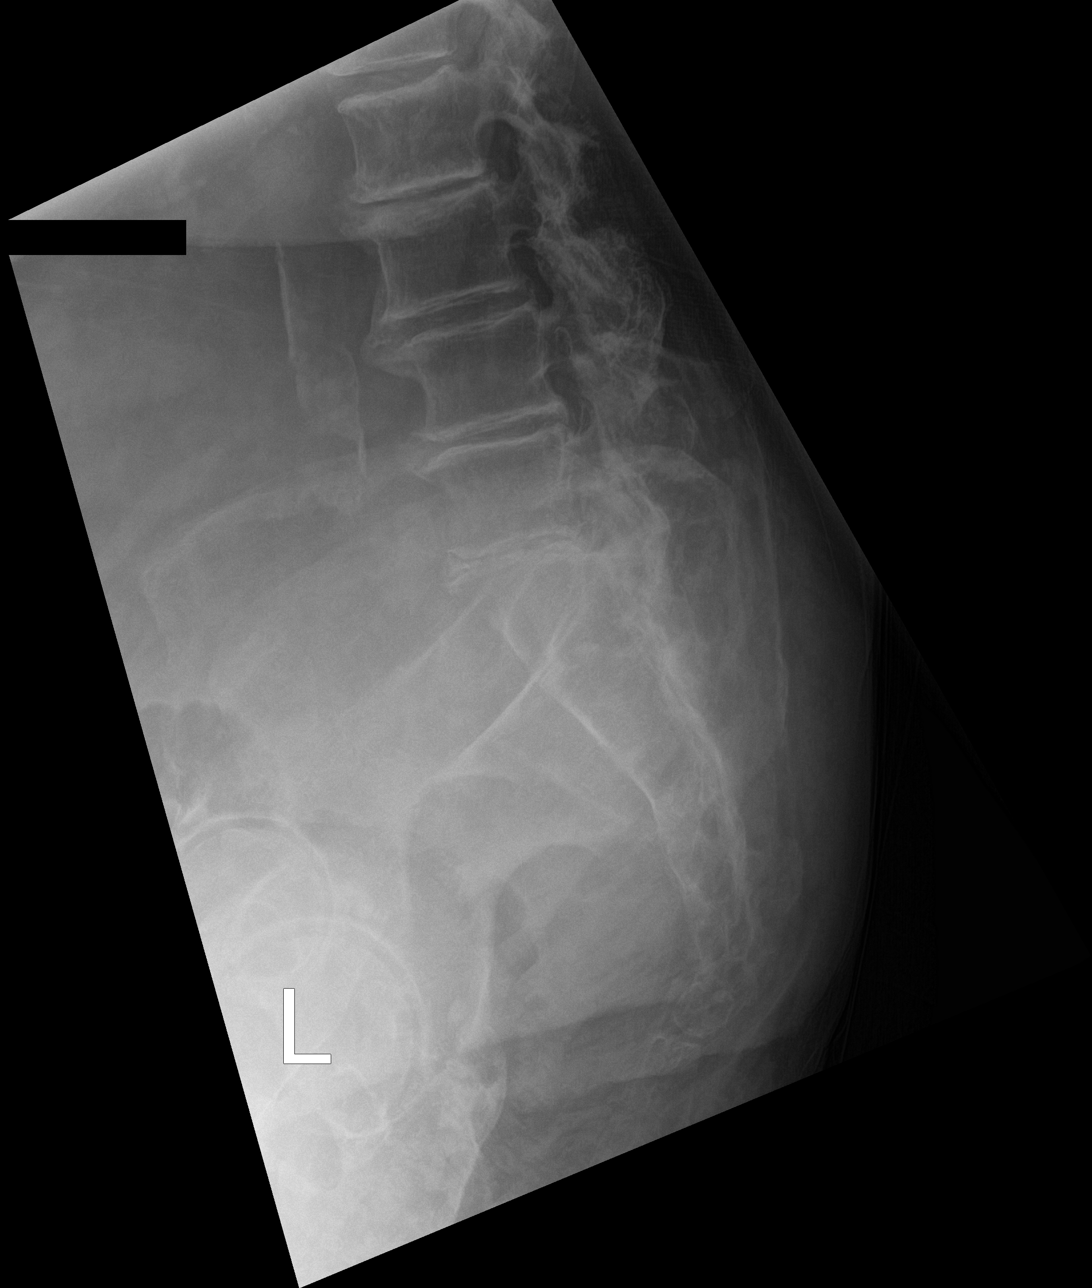

[5 of 5 positions shown; findings below may reference images not displayed]

FINDINGS: Diffuse degenerative changes lumbar spinal scoliosis concave right.
No acute bony abnormality identified. Aortoiliac atherosclerotic
vascular calcification. Pelvic calcifications consistent
phleboliths.
IMPRESSION: 1. Diffuse severe multilevel degenerative change. Mild scoliosis
concave right.

2. Aortoiliac atherosclerotic vascular disease.

## 2017-06-28 ENCOUNTER — Telehealth: Payer: Self-pay | Admitting: Family Medicine

## 2017-06-28 NOTE — Telephone Encounter (Signed)
Copied from [place] [id number]. Topic: Inquiry >> Jun 28, 2017  3:45 PM Scherrie Gerlach wrote: Reason for CRM: Karell with Hospice called to report pt has passed. 2022-07-28 2 at 9:07 am in the home

## 2017-07-15 ENCOUNTER — Encounter (HOSPITAL_COMMUNITY): Payer: Medicare Other | Admitting: Cardiology

## 2017-07-26 DEATH — deceased
